# Patient Record
Sex: Male | Born: 1955 | Race: Black or African American | Hispanic: No | Marital: Married | Smoking: Never smoker
Health system: Southern US, Community
[De-identification: ages and names within clinical notes are randomized; demographics above are authoritative.]

## PROBLEM LIST (undated history)

## (undated) DIAGNOSIS — F419 Anxiety disorder, unspecified: Secondary | ICD-10-CM

## (undated) DIAGNOSIS — I77810 Thoracic aortic ectasia: Secondary | ICD-10-CM

## (undated) DIAGNOSIS — E7800 Pure hypercholesterolemia, unspecified: Secondary | ICD-10-CM

## (undated) DIAGNOSIS — E269 Hyperaldosteronism, unspecified: Secondary | ICD-10-CM

## (undated) DIAGNOSIS — R931 Abnormal findings on diagnostic imaging of heart and coronary circulation: Secondary | ICD-10-CM

## (undated) DIAGNOSIS — R251 Tremor, unspecified: Secondary | ICD-10-CM

## (undated) DIAGNOSIS — R55 Syncope and collapse: Secondary | ICD-10-CM

## (undated) DIAGNOSIS — I10 Essential (primary) hypertension: Secondary | ICD-10-CM

## (undated) DIAGNOSIS — I422 Other hypertrophic cardiomyopathy: Secondary | ICD-10-CM

## (undated) HISTORY — DX: Tremor, unspecified: R25.1

## (undated) HISTORY — DX: Abnormal findings on diagnostic imaging of heart and coronary circulation: R93.1

## (undated) HISTORY — PX: KNEE SURGERY: SHX244

## (undated) HISTORY — DX: Thoracic aortic ectasia: I77.810

## (undated) HISTORY — DX: Pure hypercholesterolemia, unspecified: E78.00

## (undated) HISTORY — PX: HERNIA REPAIR: SHX51

## (undated) HISTORY — DX: Hyperaldosteronism, unspecified: E26.9

## (undated) HISTORY — DX: Other hypertrophic cardiomyopathy: I42.2

## (undated) HISTORY — DX: Anxiety disorder, unspecified: F41.9

## (undated) HISTORY — PX: SKIN GRAFT: SHX250

## (undated) HISTORY — DX: Syncope and collapse: R55

---

## 2011-01-07 ENCOUNTER — Emergency Department (HOSPITAL_COMMUNITY)
Admission: EM | Admit: 2011-01-07 | Discharge: 2011-01-07 | Disposition: A | Discharge status: Home / Self Care | Payer: PRIVATE HEALTH INSURANCE | Attending: Emergency Medicine | Admitting: Emergency Medicine

## 2011-01-07 DIAGNOSIS — Z862 Personal history of diseases of the blood and blood-forming organs and certain disorders involving the immune mechanism: Secondary | ICD-10-CM | POA: Insufficient documentation

## 2011-01-07 DIAGNOSIS — Z79899 Other long term (current) drug therapy: Secondary | ICD-10-CM | POA: Insufficient documentation

## 2011-01-07 DIAGNOSIS — R110 Nausea: Secondary | ICD-10-CM | POA: Insufficient documentation

## 2011-01-07 DIAGNOSIS — R42 Dizziness and giddiness: Secondary | ICD-10-CM | POA: Insufficient documentation

## 2011-01-07 DIAGNOSIS — Z8639 Personal history of other endocrine, nutritional and metabolic disease: Secondary | ICD-10-CM | POA: Insufficient documentation

## 2011-01-07 DIAGNOSIS — I10 Essential (primary) hypertension: Secondary | ICD-10-CM | POA: Insufficient documentation

## 2011-04-11 ENCOUNTER — Inpatient Hospital Stay (INDEPENDENT_AMBULATORY_CARE_PROVIDER_SITE_OTHER)
Admission: RE | Admit: 2011-04-11 | Discharge: 2011-04-11 | Disposition: A | Discharge status: Home / Self Care | Payer: PRIVATE HEALTH INSURANCE | Source: Ambulatory Visit | Attending: Emergency Medicine | Admitting: Emergency Medicine

## 2011-04-11 ENCOUNTER — Emergency Department (HOSPITAL_COMMUNITY)
Admission: EM | Admit: 2011-04-11 | Discharge: 2011-04-12 | Disposition: A | Discharge status: Home / Self Care | Payer: PRIVATE HEALTH INSURANCE | Attending: Emergency Medicine | Admitting: Emergency Medicine

## 2011-04-11 DIAGNOSIS — R42 Dizziness and giddiness: Secondary | ICD-10-CM | POA: Insufficient documentation

## 2011-04-11 DIAGNOSIS — I10 Essential (primary) hypertension: Secondary | ICD-10-CM | POA: Insufficient documentation

## 2011-04-11 DIAGNOSIS — R5383 Other fatigue: Secondary | ICD-10-CM | POA: Insufficient documentation

## 2011-04-11 DIAGNOSIS — R110 Nausea: Secondary | ICD-10-CM | POA: Insufficient documentation

## 2011-04-11 DIAGNOSIS — H538 Other visual disturbances: Secondary | ICD-10-CM | POA: Insufficient documentation

## 2011-04-11 DIAGNOSIS — R5381 Other malaise: Secondary | ICD-10-CM | POA: Insufficient documentation

## 2011-04-11 DIAGNOSIS — E876 Hypokalemia: Secondary | ICD-10-CM

## 2011-04-11 LAB — POCT I-STAT, CHEM 8
BUN: 13 mg/dL (ref 6–23)
Calcium, Ion: 1.19 mmol/L (ref 1.12–1.32)
Chloride: 105 mEq/L (ref 96–112)
Glucose, Bld: 98 mg/dL (ref 70–99)

## 2011-04-12 ENCOUNTER — Emergency Department (HOSPITAL_COMMUNITY): Payer: PRIVATE HEALTH INSURANCE

## 2011-04-12 LAB — BASIC METABOLIC PANEL
BUN: 12 mg/dL (ref 6–23)
CO2: 32 mEq/L (ref 19–32)
Chloride: 105 mEq/L (ref 96–112)
Creatinine, Ser: 0.96 mg/dL (ref 0.50–1.35)

## 2011-04-12 LAB — DIFFERENTIAL
Eosinophils Absolute: 0.1 10*3/uL (ref 0.0–0.7)
Eosinophils Relative: 1 % (ref 0–5)
Lymphs Abs: 3 10*3/uL (ref 0.7–4.0)
Monocytes Relative: 9 % (ref 3–12)

## 2011-04-12 LAB — URINALYSIS, ROUTINE W REFLEX MICROSCOPIC
Glucose, UA: NEGATIVE mg/dL
Ketones, ur: NEGATIVE mg/dL
Leukocytes, UA: NEGATIVE
pH: 7 (ref 5.0–8.0)

## 2011-04-12 LAB — CBC
MCH: 30.2 pg (ref 26.0–34.0)
MCV: 83.9 fL (ref 78.0–100.0)
Platelets: 139 10*3/uL — ABNORMAL LOW (ref 150–400)
RDW: 13 % (ref 11.5–15.5)

## 2011-04-12 LAB — CK TOTAL AND CKMB (NOT AT ARMC)
CK, MB: 4.2 ng/mL — ABNORMAL HIGH (ref 0.3–4.0)
Relative Index: 2.4 (ref 0.0–2.5)

## 2011-04-12 LAB — TROPONIN I: Troponin I: <0.3 ng/mL (ref <0.30)

## 2011-08-04 ENCOUNTER — Other Ambulatory Visit: Payer: Self-pay | Admitting: Orthopaedic Surgery

## 2011-08-04 DIAGNOSIS — M542 Cervicalgia: Secondary | ICD-10-CM

## 2011-08-07 ENCOUNTER — Inpatient Hospital Stay
Admission: RE | Admit: 2011-08-07 | Discharge: 2011-08-07 | Payer: Self-pay | Source: Ambulatory Visit | Attending: Orthopaedic Surgery | Admitting: Orthopaedic Surgery

## 2011-08-08 ENCOUNTER — Ambulatory Visit
Admission: RE | Admit: 2011-08-08 | Discharge: 2011-08-08 | Disposition: A | Discharge status: Home / Self Care | Payer: PRIVATE HEALTH INSURANCE | Source: Ambulatory Visit | Attending: Orthopaedic Surgery | Admitting: Orthopaedic Surgery

## 2011-08-08 DIAGNOSIS — M542 Cervicalgia: Secondary | ICD-10-CM

## 2011-09-11 ENCOUNTER — Observation Stay (HOSPITAL_COMMUNITY)
Admission: EM | Admit: 2011-09-11 | Discharge: 2011-09-14 | DRG: 081 | Disposition: A | Discharge status: Home / Self Care | Payer: PRIVATE HEALTH INSURANCE | Attending: Internal Medicine | Admitting: Internal Medicine

## 2011-09-11 ENCOUNTER — Emergency Department (HOSPITAL_COMMUNITY): Payer: PRIVATE HEALTH INSURANCE

## 2011-09-11 ENCOUNTER — Encounter: Payer: Self-pay | Admitting: *Deleted

## 2011-09-11 DIAGNOSIS — R209 Unspecified disturbances of skin sensation: Secondary | ICD-10-CM | POA: Insufficient documentation

## 2011-09-11 DIAGNOSIS — I10 Essential (primary) hypertension: Secondary | ICD-10-CM

## 2011-09-11 DIAGNOSIS — E876 Hypokalemia: Secondary | ICD-10-CM

## 2011-09-11 DIAGNOSIS — R4182 Altered mental status, unspecified: Principal | ICD-10-CM

## 2011-09-11 DIAGNOSIS — K7689 Other specified diseases of liver: Secondary | ICD-10-CM | POA: Insufficient documentation

## 2011-09-11 DIAGNOSIS — D696 Thrombocytopenia, unspecified: Secondary | ICD-10-CM | POA: Insufficient documentation

## 2011-09-11 HISTORY — DX: Essential (primary) hypertension: I10

## 2011-09-11 LAB — COMPREHENSIVE METABOLIC PANEL
BUN: 12 mg/dL (ref 6–23)
CO2: 27 mEq/L (ref 19–32)
Calcium: 9.3 mg/dL (ref 8.4–10.5)
Chloride: 106 mEq/L (ref 96–112)
Creatinine, Ser: 1.05 mg/dL (ref 0.50–1.35)
GFR calc Af Amer: 89 mL/min — ABNORMAL LOW (ref >90)
GFR calc non Af Amer: 77 mL/min — ABNORMAL LOW (ref >90)
Glucose, Bld: 92 mg/dL (ref 70–99)
Total Bilirubin: 0.9 mg/dL (ref 0.3–1.2)

## 2011-09-11 LAB — DIFFERENTIAL
Basophils Relative: 0 % (ref 0–1)
Blasts: 0 %
Lymphocytes Relative: 23 % (ref 12–46)
Lymphs Abs: 2.5 10*3/uL (ref 0.7–4.0)
Neutro Abs: 7.8 10*3/uL — ABNORMAL HIGH (ref 1.7–7.7)
Neutrophils Relative %: 72 % (ref 43–77)
Promyelocytes Absolute: 0 %
nRBC: 0 /100 WBC

## 2011-09-11 LAB — RAPID URINE DRUG SCREEN, HOSP PERFORMED
Barbiturates: NOT DETECTED
Benzodiazepines: NOT DETECTED
Cocaine: NOT DETECTED

## 2011-09-11 LAB — URINALYSIS, ROUTINE W REFLEX MICROSCOPIC
Nitrite: NEGATIVE
Protein, ur: NEGATIVE mg/dL
Specific Gravity, Urine: 1.017 (ref 1.005–1.030)
Urobilinogen, UA: 1 mg/dL (ref 0.0–1.0)

## 2011-09-11 LAB — CBC
Hemoglobin: 16.8 g/dL (ref 13.0–17.0)
MCHC: 34.6 g/dL (ref 30.0–36.0)
Platelets: 130 10*3/uL — ABNORMAL LOW (ref 150–400)
RDW: 13.5 % (ref 11.5–15.5)

## 2011-09-11 LAB — POCT I-STAT TROPONIN I: Troponin i, poc: 0.05 ng/mL (ref 0.00–0.08)

## 2011-09-11 LAB — CARDIAC PANEL(CRET KIN+CKTOT+MB+TROPI)
CK, MB: 5.5 ng/mL — ABNORMAL HIGH (ref 0.3–4.0)
Relative Index: 3.1 — ABNORMAL HIGH (ref 0.0–2.5)
Troponin I: <0.3 ng/mL (ref <0.30)

## 2011-09-11 LAB — SALICYLATE LEVEL: Salicylate Lvl: <2 mg/dL — ABNORMAL LOW (ref 2.8–20.0)

## 2011-09-11 LAB — ETHANOL: Alcohol, Ethyl (B): <11 mg/dL (ref 0–11)

## 2011-09-11 LAB — ACETAMINOPHEN LEVEL: Acetaminophen (Tylenol), Serum: <15 ug/mL (ref 10–30)

## 2011-09-11 MED ORDER — POTASSIUM CHLORIDE 20 MEQ/15ML (10%) PO LIQD: 40.0000 meq | Freq: Once | ORAL | Status: DC

## 2011-09-11 MED ORDER — POTASSIUM CHLORIDE 20 MEQ/15ML (10%) PO LIQD
ORAL | Status: AC
  Filled 2011-09-11: qty 30
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
## 2011-09-12 ENCOUNTER — Inpatient Hospital Stay (HOSPITAL_COMMUNITY): Payer: PRIVATE HEALTH INSURANCE

## 2011-09-12 ENCOUNTER — Encounter (HOSPITAL_COMMUNITY): Payer: Self-pay | Admitting: Internal Medicine

## 2011-09-12 DIAGNOSIS — I10 Essential (primary) hypertension: Secondary | ICD-10-CM

## 2011-09-12 DIAGNOSIS — R4182 Altered mental status, unspecified: Secondary | ICD-10-CM

## 2011-09-12 DIAGNOSIS — E876 Hypokalemia: Secondary | ICD-10-CM

## 2011-09-12 LAB — COMPREHENSIVE METABOLIC PANEL
Albumin: 3.3 g/dL — ABNORMAL LOW (ref 3.5–5.2)
BUN: 10 mg/dL (ref 6–23)
Calcium: 8.6 mg/dL (ref 8.4–10.5)
Creatinine, Ser: 0.93 mg/dL (ref 0.50–1.35)
GFR calc Af Amer: >90 mL/min (ref >90)
Glucose, Bld: 141 mg/dL — ABNORMAL HIGH (ref 70–99)
Potassium: 3.6 mEq/L (ref 3.5–5.1)
Total Protein: 5.9 g/dL — ABNORMAL LOW (ref 6.0–8.3)

## 2011-09-12 LAB — MAGNESIUM: Magnesium: 1.9 mg/dL (ref 1.5–2.5)

## 2011-09-12 LAB — AMMONIA: Ammonia: 34 umol/L (ref 11–60)

## 2011-09-12 LAB — CBC
Hemoglobin: 15.7 g/dL (ref 13.0–17.0)
Platelets: 131 10*3/uL — ABNORMAL LOW (ref 150–400)
RBC: 5.35 MIL/uL (ref 4.22–5.81)
WBC: 9.2 10*3/uL (ref 4.0–10.5)

## 2011-09-12 LAB — BASIC METABOLIC PANEL
CO2: 27 mEq/L (ref 19–32)
Calcium: 8.8 mg/dL (ref 8.4–10.5)
Chloride: 108 mEq/L (ref 96–112)
Sodium: 144 mEq/L (ref 135–145)

## 2011-09-12 LAB — TSH: TSH: 1.854 u[IU]/mL (ref 0.350–4.500)

## 2011-09-12 LAB — URINE CULTURE

## 2011-09-12 MED ORDER — LISINOPRIL 40 MG PO TABS
40.0000 mg | ORAL_TABLET | Freq: Every day | ORAL | Status: DC
  Administered 2011-09-12 – 2011-09-14 (×3): 40 mg via ORAL
  Filled 2011-09-12 (×3): qty 1

## 2011-09-12 MED ORDER — ATENOLOL 50 MG PO TABS
50.0000 mg | ORAL_TABLET | Freq: Every day | ORAL | Status: DC
  Administered 2011-09-12 – 2011-09-13 (×2): 50 mg via ORAL
  Filled 2011-09-12 (×2): qty 1

## 2011-09-12 MED ORDER — COLCHICINE 0.6 MG PO TABS
0.6000 mg | ORAL_TABLET | Freq: Every day | ORAL | Status: DC
  Administered 2011-09-12 – 2011-09-14 (×3): 0.6 mg via ORAL
  Filled 2011-09-12 (×3): qty 1

## 2011-09-12 MED ORDER — HYDRALAZINE HCL 20 MG/ML IJ SOLN
10.0000 mg | INTRAMUSCULAR | Status: DC | PRN
  Filled 2011-09-12: qty 0.5

## 2011-09-12 MED ORDER — SODIUM CHLORIDE 0.9 % IV SOLN
INTRAVENOUS | Status: DC
  Administered 2011-09-12 – 2011-09-13 (×2): via INTRAVENOUS

## 2011-09-12 MED ORDER — POTASSIUM CHLORIDE 10 MEQ/100ML IV SOLN
10.0000 meq | INTRAVENOUS | Status: AC
  Administered 2011-09-12 (×5): 10 meq via INTRAVENOUS
  Filled 2011-09-12 (×5): qty 100

## 2011-09-12 MED ORDER — ASPIRIN EC 81 MG PO TBEC
81.0000 mg | DELAYED_RELEASE_TABLET | Freq: Every day | ORAL | Status: DC
  Administered 2011-09-12 – 2011-09-14 (×3): 81 mg via ORAL
  Filled 2011-09-12 (×3): qty 1

## 2011-09-12 MED ORDER — AMLODIPINE BESYLATE 10 MG PO TABS
10.0000 mg | ORAL_TABLET | Freq: Every day | ORAL | Status: DC
  Filled 2011-09-12: qty 1

## 2011-09-12 MED ORDER — ACETAMINOPHEN 325 MG PO TABS
650.0000 mg | ORAL_TABLET | Freq: Four times a day (QID) | ORAL | Status: DC | PRN
  Administered 2011-09-14 (×2): 650 mg via ORAL
  Filled 2011-09-12 (×2): qty 2

## 2011-09-12 MED ORDER — INFLUENZA VIRUS VACC SPLIT PF IM SUSP
0.5000 mL | INTRAMUSCULAR | Status: AC
  Filled 2011-09-12: qty 0.5

## 2011-09-12 MED ORDER — ONDANSETRON HCL 4 MG/2ML IJ SOLN: 4.0000 mg | Freq: Four times a day (QID) | INTRAMUSCULAR | Status: DC | PRN

## 2011-09-12 MED ORDER — POTASSIUM CHLORIDE CRYS ER 20 MEQ PO TBCR
40.0000 meq | EXTENDED_RELEASE_TABLET | Freq: Three times a day (TID) | ORAL | Status: AC
  Administered 2011-09-12 (×3): 40 meq via ORAL
  Filled 2011-09-12 (×3): qty 2

## 2011-09-12 MED ORDER — ONDANSETRON HCL 4 MG PO TABS: 4.0000 mg | ORAL_TABLET | Freq: Four times a day (QID) | ORAL | Status: DC | PRN

## 2011-09-12 MED ORDER — ACETAMINOPHEN 650 MG RE SUPP: 650.0000 mg | Freq: Four times a day (QID) | RECTAL | Status: DC | PRN

## 2011-09-12 MED ORDER — FLUTICASONE PROPIONATE 50 MCG/ACT NA SUSP
2.0000 | Freq: Every day | NASAL | Status: DC
  Administered 2011-09-12: 2 via NASAL
  Filled 2011-09-12: qty 16

## 2011-09-12 NOTE — Progress Notes (Signed)
Pharmacy called spoke to pharm tech at [facility]. Pharmacy to call Dr. Arsenio Loader office for medication updates per Dr. Antionette Poles request. Julien Nordmann [facility]

## 2011-09-12 NOTE — Progress Notes (Signed)
Subjective: hypertensive overnight .  Objective: Vital signs in last 24 hours: Filed Vitals:   09/12/11 0100 09/12/11 0125 09/12/11 0222 09/12/11 0600  BP: 168/92 188/6 168/94 164/87  Pulse: 62 71 65 59  Temp:   98.1 F (36.7 C) 98.2 F (36.8 C)  TempSrc:   Oral Oral  Resp: 18 20 20 18   Height:   5\' 11"  (1.803 m)   Weight:   81.2 kg (179 lb 0.2 oz)   SpO2: 95% 98% 98% 97%    Intake/Output Summary (Last 24 hours) at 09/12/11 1107 Last data filed at 09/12/11 0645  Gross per 24 hour  Intake   1845 ml  Output   1600 ml  Net    245 ml    Weight change:   General: Alert, awake, oriented x3, in no acute distress. HEENT: No bruits, no goiter. Heart: Regular rate and rhythm, without murmurs, rubs, gallops. Lungs: Clear to auscultation bilaterally. Abdomen: Soft, nontender, nondistended, positive bowel sounds. Extremities: No clubbing cyanosis or edema with positive pedal pulses. Neuro: Grossly intact, nonfocal.    Lab Results: Results for orders placed during the hospital encounter of 09/11/11 (from the past 24 hour(s))  CBC     Status: Abnormal   Collection Time   09/11/11  9:36 PM      Component Value Range   WBC 10.8 (*) 4.0 - 10.5 (K/uL)   RBC 5.70  4.22 - 5.81 (MIL/uL)   Hemoglobin 16.8  13.0 - 17.0 (g/dL)   HCT 16.1  09.6 - 04.5 (%)   MCV 85.3  78.0 - 100.0 (fL)   MCH 29.5  26.0 - 34.0 (pg)   MCHC 34.6  30.0 - 36.0 (g/dL)   RDW 40.9  81.1 - 91.4 (%)   Platelets 130 (*) 150 - 400 (K/uL)  DIFFERENTIAL     Status: Abnormal   Collection Time   09/11/11  9:36 PM      Component Value Range   Neutrophils Relative 72  43 - 77 (%)   Lymphocytes Relative 23  12 - 46 (%)   Monocytes Relative 4  3 - 12 (%)   Eosinophils Relative 1  0 - 5 (%)   Basophils Relative 0  0 - 1 (%)   Band Neutrophils 0  0 - 10 (%)   Metamyelocytes Relative 0     Myelocytes 0     Promyelocytes Absolute 0     Blasts 0     nRBC 0  0 (/100 WBC)   Neutro Abs 7.8 (*) 1.7 - 7.7 (K/uL)   Lymphs Abs 2.5  0.7 - 4.0 (K/uL)   Monocytes Absolute 0.4  0.1 - 1.0 (K/uL)   Eosinophils Absolute 0.1  0.0 - 0.7 (K/uL)   Basophils Absolute 0.0  0.0 - 0.1 (K/uL)  COMPREHENSIVE METABOLIC PANEL     Status: Abnormal   Collection Time   09/11/11  9:36 PM      Component Value Range   Sodium 142  135 - 145 (mEq/L)   Potassium 2.8 (*) 3.5 - 5.1 (mEq/L)   Chloride 106  96 - 112 (mEq/L)   CO2 27  19 - 32 (mEq/L)   Glucose, Bld 92  70 - 99 (mg/dL)   BUN 12  6 - 23 (mg/dL)   Creatinine, Ser 7.82  0.50 - 1.35 (mg/dL)   Calcium 9.3  8.4 - 95.6 (mg/dL)   Total Protein 6.4  6.0 - 8.3 (g/dL)   Albumin 3.8  3.5 - 5.2 (  g/dL)   AST 25  0 - 37 (U/L)   ALT 22  0 - 53 (U/L)   Alkaline Phosphatase 69  39 - 117 (U/L)   Total Bilirubin 0.9  0.3 - 1.2 (mg/dL)   GFR calc non Af Amer 77 (*) >90 (mL/min)   GFR calc Af Amer 89 (*) >90 (mL/min)  URINALYSIS, ROUTINE W REFLEX MICROSCOPIC     Status: Normal   Collection Time   09/11/11  9:36 PM      Component Value Range   Color, Urine YELLOW  YELLOW    Appearance CLEAR  CLEAR    Specific Gravity, Urine 1.017  1.005 - 1.030    pH 6.5  5.0 - 8.0    Glucose, UA NEGATIVE  NEGATIVE (mg/dL)   Hgb urine dipstick NEGATIVE  NEGATIVE    Bilirubin Urine NEGATIVE  NEGATIVE    Ketones, ur NEGATIVE  NEGATIVE (mg/dL)   Protein, ur NEGATIVE  NEGATIVE (mg/dL)   Urobilinogen, UA 1.0  0.0 - 1.0 (mg/dL)   Nitrite NEGATIVE  NEGATIVE    Leukocytes, UA NEGATIVE  NEGATIVE   CARDIAC PANEL(CRET KIN+CKTOT+MB+TROPI)     Status: Abnormal   Collection Time   09/11/11  9:36 PM      Component Value Range   Total CK 179  7 - 232 (U/L)   CK, MB 5.5 (*) 0.3 - 4.0 (ng/mL)   Troponin I <0.30  <0.30 (ng/mL)   Relative Index 3.1 (*) 0.0 - 2.5   URINE RAPID DRUG SCREEN (HOSP PERFORMED)     Status: Normal   Collection Time   09/11/11  9:36 PM      Component Value Range   Opiates NONE DETECTED  NONE DETECTED    Cocaine NONE DETECTED  NONE DETECTED    Benzodiazepines NONE DETECTED   NONE DETECTED    Amphetamines NONE DETECTED  NONE DETECTED    Tetrahydrocannabinol NONE DETECTED  NONE DETECTED    Barbiturates NONE DETECTED  NONE DETECTED   ETHANOL     Status: Normal   Collection Time   09/11/11  9:36 PM      Component Value Range   Alcohol, Ethyl (B) <11  0 - 11 (mg/dL)  SALICYLATE LEVEL     Status: Abnormal   Collection Time   09/11/11  9:36 PM      Component Value Range   Salicylate Lvl <2.0 (*) 2.8 - 20.0 (mg/dL)  ACETAMINOPHEN LEVEL     Status: Normal   Collection Time   09/11/11  9:36 PM      Component Value Range   Acetaminophen (Tylenol), Serum <15.0  10 - 30 (ug/mL)  POCT I-STAT TROPONIN I     Status: Normal   Collection Time   09/11/11  9:49 PM      Component Value Range   Troponin i, poc 0.05  0.00 - 0.08 (ng/mL)   Comment 3              Micro: No results found for this or any previous visit (from the past 240 hour(s)).  Studies/Results: Ct Head Wo Contrast  09/11/2011  *   IMPRESSION: Negative exam.  Original Report Authenticated By: Patterson Hammersmith, M.D.   Mr Brain Wo Contrast  09/12/2011  *  IMPRESSION: No acute intracranial abnormality.  Mild chronic sinusitis.  Low lying cerebellar tonsil on the right, probably an incidental finding.  Original Report Authenticated By: Camelia Phenes, M.D.    Medications:     .  amLODipine  10 mg Oral Daily  . atenolol  50 mg Oral Daily  . influenza  inactive virus vaccine  0.5 mL Intramuscular Tomorrow-1000  . potassium chloride  10 mEq Intravenous Q1 Hr x 5  . potassium chloride      . potassium chloride  40 mEq Oral TID  . DISCONTD: potassium chloride  40 mEq Oral Once    Assessment:  1. Brief episode of altered mental status.  #2. Hypokalemia. Rule out hyperaldosteronism , workup is pending #3. Uncontrolled hypertension.   Plan  continue telemetry due to hypokalemia   MRI negative, EEG is pending  We will also check TSH RPR and B12 levels.  For his hypokalemia we will replace  potassium and recheck metabolic panel later.    renin aldosterone ratio and levels. pending Further recommendations based on the tests ordered   LOS: 1 day   [facility] 09/12/2011, 11:07 AM

## 2011-09-12 NOTE — Progress Notes (Signed)
Physical Therapy Evaluation Patient Details Name: Duane Warren MRN: [id number] DOB: 1956-01-27 Today's Date: 09/12/2011  Problem List:  Patient Active Problem List  Diagnoses  . Altered mental status  . Hypokalemia  . Hypertension    Past Medical History:  Past Medical History  Diagnosis Date  . Hypertension   . Heart disease   . Heart murmur    Past Surgical History:  Past Surgical History  Procedure Date  . Hernia repair     PT Assessment/Plan/Recommendation PT Assessment Clinical Impression Statement: Pt. tolerated treatment well.  Independent with all mobility.  No further acute PT needs.  PT Recommendation/Assessment: Patent does not need any further PT services No Skilled PT: Patient at baseline level of functioning;Patient is independent with all acitivity/mobility PT Goals     PT Evaluation Precautions/Restrictions    Prior Functioning  Home Living Lives With: Spouse;Family (Mother and sister) Type of Home: House Home Layout: Multi-level;Able to live on main level with bedroom/bathroom Alternate Level Stairs-Rails: Right Home Access: Stairs to enter Entrance Stairs-Rails: Right Entrance Stairs-Number of Steps: 2 in garage, 4 at front porch Bathroom Shower/Tub: Walk-in shower;Door [employer] Toilet: Standard Bathroom Accessibility: Yes How Accessible: Accessible via walker Home Adaptive Equipment: None Prior Function Level of Independence: Independent with basic ADLs;Independent with gait;Independent with transfers;Independent with homemaking with ambulation Driving: Yes Vocation: Retired Financial risk analyst Arousal/Alertness: Awake/alert Overall Cognitive Status: Appears within functional limits for tasks assessed Orientation Level: Oriented X4 Cognition - Other Comments: Per pt.'s wife, pt.'s memory is coming back although he still cannot recall this past sunday being their anniversary.  Sensation/Coordination   Extremity Assessment RLE  Assessment RLE Assessment: Within Functional Limits LLE Assessment LLE Assessment: Within Functional Limits Mobility (including Balance) Bed Mobility Bed Mobility: Yes Supine to Sit: 7: Independent Sit to Supine - Right: 7: Independent Transfers Transfers: Yes Sit to Stand: 7: Independent Stand to Sit: 7: Independent Ambulation/Gait Ambulation/Gait: Yes Ambulation/Gait Assistance: 7: Independent Ambulation Distance (Feet): 350 Feet Assistive device: None Gait Pattern: Within Functional Limits Stairs: Yes Stairs Assistance: 7: Independent Stair Management Technique: One rail Right Number of Stairs: 4  Height of Stairs: 8   Posture/Postural Control Posture/Postural Control: No significant limitations Exercise    End of Session PT - End of Session Activity Tolerance: Patient tolerated treatment well Patient left: in bed;with call bell in reach;with family/visitor present Nurse Communication: Mobility status for ambulation General Behavior During Session: [facility] for tasks performed Cognition: Impaired Cognitive Impairment: Per pt.'s wife still has a few STM deficits but states that "he is coming back."  Evaline Waltman, SPT  09/12/2011, 11:48 AM

## 2011-09-12 NOTE — Progress Notes (Signed)
Called 5th floor pharmacy to verify medication list updates, update needs to be completed spoke to pharmacist Duane Warren. Duane Warren [facility]

## 2011-09-12 NOTE — H&P (Signed)
Duane Warren is an 55 y.o. male.   Chief Complaint: Confusion. HPI: 55 year old male with history of hypertension had gone to his lawyer with his wife yesterday evening. And at around 5 PM and they're trying to return back home, patient was not able to locate the car in the parking lot at the lawyers office. His wife helped him to locate the car. Then they drove to their house. But patient was not able to find the way to the house. He went past the street of his house. His wife had to redirect him to the house. When they reached the house he was not able to locate the garage opener. When they reached inside the house patient looked confused was not able to recognize things and said he was not feeling well. At this point his wife brought him to the ER. In the ER patient had CAT scan head which was negative for anything acute. His labs showed he has hypokalemia. Patient was alert awake oriented to time place and person but still does not recall the incident which happened after the lawyers office visit. Patient denies headache any focal deficit any dizziness any visual symptoms chest pain nausea vomiting or shortness of breath.  Past Medical History  Diagnosis Date  . Hypertension   . Heart disease     Past Surgical History  Procedure Date  . Hernia repair     History reviewed. No pertinent family history. Social History:  reports that he has never smoked. He does not have any smokeless tobacco history on file. He reports that he does not drink alcohol. His drug history not on file.  Allergies: No Known Allergies  Medications Prior to Admission  Medication Dose Route Frequency Provider Last Rate Last Dose  . 0.9 %  sodium chloride infusion   Intravenous Continuous Eduard Clos      . acetaminophen (TYLENOL) tablet 650 mg  650 mg Oral Q6H PRN Eduard Clos       Or  . acetaminophen (TYLENOL) suppository 650 mg  650 mg Rectal Q6H PRN Eduard Clos      . amLODipine  (NORVASC) tablet 10 mg  10 mg Oral Daily Eduard Clos      . atenolol (TENORMIN) tablet 50 mg  50 mg Oral Daily Eduard Clos      . hydrALAZINE (APRESOLINE) injection 10 mg  10 mg Intravenous Q4H PRN Eduard Clos      . ondansetron (ZOFRAN) tablet 4 mg  4 mg Oral Q6H PRN Eduard Clos       Or  . ondansetron (ZOFRAN) injection 4 mg  4 mg Intravenous Q6H PRN Eduard Clos      . potassium chloride 10 mEq in 100 mL IVPB  10 mEq Intravenous Q1 Hr x 5 Eduard Clos      . potassium chloride 20 MEQ/15ML (10%) liquid           . DISCONTD: potassium chloride 20 MEQ/15ML (10%) liquid 40 mEq  40 mEq Oral Once Carleene Cooper III, MD       No current outpatient prescriptions on file as of 09/12/2011.    Results for orders placed during the hospital encounter of 09/11/11 (from the past 48 hour(s))  CBC     Status: Abnormal   Collection Time   09/11/11  9:36 PM      Component Value Range Comment   WBC 10.8 (*) 4.0 - 10.5 (K/uL)    RBC  5.70  4.22 - 5.81 (MIL/uL)    Hemoglobin 16.8  13.0 - 17.0 (g/dL)    HCT 82.9  56.2 - 13.0 (%)    MCV 85.3  78.0 - 100.0 (fL)    MCH 29.5  26.0 - 34.0 (pg)    MCHC 34.6  30.0 - 36.0 (g/dL)    RDW 86.5  78.4 - 69.6 (%)    Platelets 130 (*) 150 - 400 (K/uL)   DIFFERENTIAL     Status: Abnormal   Collection Time   09/11/11  9:36 PM      Component Value Range Comment   Neutrophils Relative 72  43 - 77 (%)    Lymphocytes Relative 23  12 - 46 (%)    Monocytes Relative 4  3 - 12 (%)    Eosinophils Relative 1  0 - 5 (%)    Basophils Relative 0  0 - 1 (%)    Band Neutrophils 0  0 - 10 (%)    Metamyelocytes Relative 0      Myelocytes 0      Promyelocytes Absolute 0      Blasts 0      nRBC 0  0 (/100 WBC)    Neutro Abs 7.8 (*) 1.7 - 7.7 (K/uL)    Lymphs Abs 2.5  0.7 - 4.0 (K/uL)    Monocytes Absolute 0.4  0.1 - 1.0 (K/uL)    Eosinophils Absolute 0.1  0.0 - 0.7 (K/uL)    Basophils Absolute 0.0  0.0 - 0.1 (K/uL)     COMPREHENSIVE METABOLIC PANEL     Status: Abnormal   Collection Time   09/11/11  9:36 PM      Component Value Range Comment   Sodium 142  135 - 145 (mEq/L)    Potassium 2.8 (*) 3.5 - 5.1 (mEq/L)    Chloride 106  96 - 112 (mEq/L)    CO2 27  19 - 32 (mEq/L)    Glucose, Bld 92  70 - 99 (mg/dL)    BUN 12  6 - 23 (mg/dL)    Creatinine, Ser 2.95  0.50 - 1.35 (mg/dL)    Calcium 9.3  8.4 - 10.5 (mg/dL)    Total Protein 6.4  6.0 - 8.3 (g/dL)    Albumin 3.8  3.5 - 5.2 (g/dL)    AST 25  0 - 37 (U/L)    ALT 22  0 - 53 (U/L)    Alkaline Phosphatase 69  39 - 117 (U/L)    Total Bilirubin 0.9  0.3 - 1.2 (mg/dL)    GFR calc non Af Amer 77 (*) >90 (mL/min)    GFR calc Af Amer 89 (*) >90 (mL/min)   URINALYSIS, ROUTINE W REFLEX MICROSCOPIC     Status: Normal   Collection Time   09/11/11  9:36 PM      Component Value Range Comment   Color, Urine YELLOW  YELLOW     Appearance CLEAR  CLEAR     Specific Gravity, Urine 1.017  1.005 - 1.030     pH 6.5  5.0 - 8.0     Glucose, UA NEGATIVE  NEGATIVE (mg/dL)    Hgb urine dipstick NEGATIVE  NEGATIVE     Bilirubin Urine NEGATIVE  NEGATIVE     Ketones, ur NEGATIVE  NEGATIVE (mg/dL)    Protein, ur NEGATIVE  NEGATIVE (mg/dL)    Urobilinogen, UA 1.0  0.0 - 1.0 (mg/dL)    Nitrite NEGATIVE  NEGATIVE  Leukocytes, UA NEGATIVE  NEGATIVE  MICROSCOPIC NOT DONE ON URINES WITH NEGATIVE PROTEIN, BLOOD, LEUKOCYTES, NITRITE, OR GLUCOSE <1000 mg/dL.  CARDIAC PANEL(CRET KIN+CKTOT+MB+TROPI)     Status: Abnormal   Collection Time   09/11/11  9:36 PM      Component Value Range Comment   Total CK 179  7 - 232 (U/L)    CK, MB 5.5 (*) 0.3 - 4.0 (ng/mL)    Troponin I <0.30  <0.30 (ng/mL)    Relative Index 3.1 (*) 0.0 - 2.5    URINE RAPID DRUG SCREEN (HOSP PERFORMED)     Status: Normal   Collection Time   09/11/11  9:36 PM      Component Value Range Comment   Opiates NONE DETECTED  NONE DETECTED     Cocaine NONE DETECTED  NONE DETECTED     Benzodiazepines NONE DETECTED   NONE DETECTED     Amphetamines NONE DETECTED  NONE DETECTED     Tetrahydrocannabinol NONE DETECTED  NONE DETECTED     Barbiturates NONE DETECTED  NONE DETECTED    ETHANOL     Status: Normal   Collection Time   09/11/11  9:36 PM      Component Value Range Comment   Alcohol, Ethyl (B) <11  0 - 11 (mg/dL)   SALICYLATE LEVEL     Status: Abnormal   Collection Time   09/11/11  9:36 PM      Component Value Range Comment   Salicylate Lvl <2.0 (*) 2.8 - 20.0 (mg/dL)   ACETAMINOPHEN LEVEL     Status: Normal   Collection Time   09/11/11  9:36 PM      Component Value Range Comment   Acetaminophen (Tylenol), Serum <15.0  10 - 30 (ug/mL)   POCT I-STAT TROPONIN I     Status: Normal   Collection Time   09/11/11  9:49 PM      Component Value Range Comment   Troponin i, poc 0.05  0.00 - 0.08 (ng/mL)    Comment 3             Ct Head Wo Contrast  09/11/2011  *RADIOLOGY REPORT*  Clinical Data:   Memory loss.  Fingertip numbness.  CT HEAD WITHOUT CONTRAST  Technique:  Contiguous axial images were obtained from the base of the skull through the vertex without contrast.  Comparison: None  Findings: There is no intra or extra-axial fluid collection or mass lesion.  The basilar cisterns and ventricles have a normal appearance.  There is no CT evidence for acute infarction or hemorrhage.  Bone windows show no acute finding.  IMPRESSION: Negative exam.  Original Report Authenticated By: Patterson Hammersmith, M.D.    Review of Systems  Constitutional: Negative.   HENT: Negative.   Eyes: Negative.   Respiratory: Negative.   Cardiovascular: Negative.   Gastrointestinal: Negative.   Genitourinary: Negative.   Musculoskeletal: Negative.   Skin: Negative.   Neurological:       Confusion  Psychiatric/Behavioral: Negative.     Blood pressure 188/6, pulse 71, temperature 98.4 F (36.9 C), temperature source Oral, resp. rate 20, SpO2 98.00%. Physical Exam  Constitutional: He is oriented to person, place,  and time. He appears well-developed and well-nourished.  HENT:  Head: Normocephalic and atraumatic.  Right Ear: External ear normal.  Left Ear: External ear normal.  Nose: Nose normal.  Mouth/Throat: Oropharynx is clear and moist.  Eyes: Conjunctivae and EOM are normal. Pupils are equal, round, and reactive to light.  Neck: Normal range of motion. Neck supple.  Cardiovascular: Normal rate, regular rhythm, normal heart sounds and intact distal pulses.   Respiratory: Effort normal and breath sounds normal.  GI: Soft. Bowel sounds are normal.  Musculoskeletal: Normal range of motion.  Neurological: He is alert and oriented to person, place, and time. He has normal reflexes.  Skin: Skin is warm and dry.  Psychiatric: His behavior is normal.     Assessment/Plan #1. Brief episode of altered mental status. #2. Hypokalemia. #3. Uncontrolled hypertension.  Plan Will admit patient to telemetry for observation. At this time patient is alert awake and oriented to place person and time He has no focal deficits. I'm going to get an MRI of the brain and an EEG. We will also check TSH RPR and B12 levels. For his hypokalemia we will replace potassium and recheck metabolic panel later. Since patient has hypertension with hypokalemia we'll check renin aldosterone ratio and levels. Further recommendations based on the tests ordered.  Kamilia Carollo N. 09/12/2011, 2:02 AM

## 2011-09-12 NOTE — Progress Notes (Signed)
Seen and agree with SPT note Theadora Noyes Tabor, PT [phone]  

## 2011-09-13 ENCOUNTER — Inpatient Hospital Stay (HOSPITAL_COMMUNITY): Payer: PRIVATE HEALTH INSURANCE

## 2011-09-13 LAB — BASIC METABOLIC PANEL
CO2: 29 mEq/L (ref 19–32)
GFR calc non Af Amer: 74 mL/min — ABNORMAL LOW (ref >90)
Glucose, Bld: 92 mg/dL (ref 70–99)
Potassium: 3.7 mEq/L (ref 3.5–5.1)
Sodium: 144 mEq/L (ref 135–145)

## 2011-09-13 MED ORDER — CARVEDILOL 6.25 MG PO TABS
6.2500 mg | ORAL_TABLET | Freq: Two times a day (BID) | ORAL | Status: DC
  Administered 2011-09-13 – 2011-09-14 (×2): 6.25 mg via ORAL
  Filled 2011-09-13 (×5): qty 1

## 2011-09-13 MED ORDER — GADOBENATE DIMEGLUMINE 529 MG/ML IV SOLN
17.0000 mL | Freq: Once | INTRAVENOUS | Status: AC | PRN
  Administered 2011-09-13: 17 mL via INTRAVENOUS

## 2011-09-13 MED ORDER — ENALAPRILAT 1.25 MG/ML IV SOLN
1.2500 mg | Freq: Four times a day (QID) | INTRAVENOUS | Status: DC | PRN
  Filled 2011-09-13: qty 1

## 2011-09-13 MED ORDER — CLONIDINE HCL 0.1 MG PO TABS
0.1000 mg | ORAL_TABLET | Freq: Three times a day (TID) | ORAL | Status: DC
  Administered 2011-09-13 – 2011-09-14 (×2): 0.1 mg via ORAL
  Filled 2011-09-13 (×5): qty 1

## 2011-09-13 MED ORDER — AMLODIPINE BESYLATE 5 MG PO TABS
5.0000 mg | ORAL_TABLET | Freq: Every day | ORAL | Status: DC
  Filled 2011-09-13: qty 1

## 2011-09-13 MED ORDER — AMLODIPINE BESYLATE 10 MG PO TABS
10.0000 mg | ORAL_TABLET | Freq: Every day | ORAL | Status: DC
  Filled 2011-09-13 (×3): qty 1

## 2011-09-13 MED ORDER — CARVEDILOL 12.5 MG PO TABS
12.5000 mg | ORAL_TABLET | Freq: Two times a day (BID) | ORAL | Status: DC
  Filled 2011-09-13: qty 1

## 2011-09-13 NOTE — Progress Notes (Signed)
Subjective: Patient denies any complaints. He has no recollection of the events of admission including his altered mental status.  Discussed with patient spouse was at the bedside. She indicates that his altered mental status started subacutely and lasted anywhere between 12-24 hours. Currently she says that his mental status is back to baseline and normal. He has not had similar presentations in the past. He however has had episodes of hypokalemia in the past. Does no history of nausea, vomiting or diarrhea. Does no history of being on diuretics. No history of substance or alcohol abuse.  Objective: Blood pressure 155/91, pulse 49, temperature 98 F (36.7 C), temperature source Oral, resp. rate 18, height 5\' 11"  (1.803 m), weight 81.2 kg (179 lb 0.2 oz), SpO2 96.00%.  Intake/Output Summary (Last 24 hours) at 09/13/11 1349 Last data filed at 09/13/11 0900  Gross per 24 hour  Intake   1386 ml  Output      0 ml  Net   1386 ml   General exam: Patient is standing up next to his bed and is in no obvious distress. Respiratory system: Clear. No increased work of breathing. Cardiovascular system: First and second heart sounds heard, regular bradycardic. No murmur or JVD. Telemetry shows sinus bradycardia in the 50s. Gastrointestinal system: Abdomen is nondistended, nontender, soft and normal bowel sounds heard. No venous hums or bruits heard. Central nervous system: Alert and oriented. No focal neurological deficits. Extremities: Grade 5 x 5 power symmetrically.   Lab Results: Basic Metabolic Panel:  Basename 09/12/11 2218 09/12/11 1156  NA 144 142  K 3.4* 3.6  CL 108 107  CO2 27 27  GLUCOSE 109* 141*  BUN 13 10  CREATININE 1.07 0.93  CALCIUM 8.8 8.6  MG -- 1.9  PHOS -- --   Liver Function Tests:  [facility] 09/12/11 1156 09/11/11 2136  AST 20 25  ALT 20 22  ALKPHOS 65 69  BILITOT 0.8 0.9  PROT 5.9* 6.4  ALBUMIN 3.3* 3.8   No results found for this basename: LIPASE:2,AMYLASE:2  in the last 72 hours  Basename 09/12/11 1540  AMMONIA 34   CBC:  Basename 09/12/11 1156 09/11/11 2136  WBC 9.2 10.8*  NEUTROABS -- 7.8*  HGB 15.7 16.8  HCT 45.6 48.6  MCV 85.2 85.3  PLT 131* 130*   Cardiac Enzymes:  Basename 09/11/11 2136  CKTOTAL 179  CKMB 5.5*  CKMBINDEX --  TROPONINI <0.30   BNP: No results found for this basename: POCBNP:3 in the last 72 hours D-Dimer: No results found for this basename: DDIMER:2 in the last 72 hours CBG: No results found for this basename: GLUCAP:6 in the last 72 hours Hemoglobin A1C: No results found for this basename: HGBA1C in the last 72 hours Fasting Lipid Panel: No results found for this basename: CHOL,HDL,LDLCALC,TRIG,CHOLHDL,LDLDIRECT in the last 72 hours Thyroid Function Tests:  [facility] 09/12/11 1156  TSH 1.854  T4TOTAL --  FREET4 --  T3FREE --  THYROIDAB --   Anemia Panel:  Basename 09/12/11 1156  VITAMINB12 487  FOLATE --  FERRITIN --  TIBC --  IRON --  RETICCTPCT --   Coagulation: No results found for this basename: LABPROT:2,INR:2 in the last 72 hours Urine Drug Screen: Drugs of Abuse     Component Value Date/Time   LABOPIA NONE DETECTED 09/11/2011 2136   COCAINSCRNUR NONE DETECTED 09/11/2011 2136   LABBENZ NONE DETECTED 09/11/2011 2136   AMPHETMU NONE DETECTED 09/11/2011 2136   THCU NONE DETECTED 09/11/2011 2136   LABBARB NONE  DETECTED 09/11/2011 2136    Alcohol Level:  [facility] 09/11/11 2136  ETH <11   Urinalysis:  Misc. Labs:   Micro Results: Recent Results (from the past 240 hour(s))  URINE CULTURE     Status: Normal   Collection Time   09/11/11  9:36 PM      Component Value Range Status Comment   Specimen Description URINE, CLEAN CATCH   Final    Special Requests NONE   Final    Setup Time [id number]   Final    Colony Count NO GROWTH   Final    Culture NO GROWTH   Final    Report Status 09/12/2011 FINAL   Final     Studies/Results: Ct Head Wo Contrast  09/11/2011   *RADIOLOGY REPORT*  Clinical Data:   Memory loss.  Fingertip numbness.  CT HEAD WITHOUT CONTRAST  Technique:  Contiguous axial images were obtained from the base of the skull through the vertex without contrast.  Comparison: None  Findings: There is no intra or extra-axial fluid collection or mass lesion.  The basilar cisterns and ventricles have a normal appearance.  There is no CT evidence for acute infarction or hemorrhage.  Bone windows show no acute finding.  IMPRESSION: Negative exam.  Original Report Authenticated By: Patterson Hammersmith, M.D.   Mr Brain Wo Contrast  09/12/2011  *RADIOLOGY REPORT*  Clinical Data: Mental status  MRI HEAD WITHOUT CONTRAST  Technique:  Multiplanar, multiecho pulse sequences of the brain and surrounding structures were obtained according to standard protocol without intravenous contrast.  Comparison: CT 09/11/2011  Findings: Low lying cerebellar tonsils particularly  on the right side.  The right tonsil is 8 mm below the foramen magnum.  Negative for hydrocephalus.  Negative for acute infarct.  Negative for demyelinating disease. Brainstem is normal.  Small hyperintensities in the basal ganglia and thalami may represent perivascular spaces versus small chronic infarct.  Tiny chronic infarct left cerebellum.  Negative for hemorrhage or fluid collection.  Negative for mass or edema.  Chronic sinusitis with mucosal edema in the maxillary sinus bilaterally.  IMPRESSION: No acute intracranial abnormality.  Mild chronic sinusitis.  Low lying cerebellar tonsil on the right, probably an incidental finding.  Original Report Authenticated By: Camelia Phenes, M.D.    Medications: Scheduled Meds:   . aspirin EC  81 mg Oral Daily  . atenolol  50 mg Oral Daily  . colchicine  0.6 mg Oral Daily  . fluticasone  2 spray Each Nare Daily  . influenza  inactive virus vaccine  0.5 mL Intramuscular Tomorrow-1000  . lisinopril  40 mg Oral Daily  . potassium chloride  40 mEq Oral TID  .  DISCONTD: amLODipine  10 mg Oral Daily   Continuous Infusions:   . sodium chloride 50 mL/hr at 09/13/11 0503   PRN Meds:.acetaminophen, acetaminophen, hydrALAZINE, ondansetron (ZOFRAN) IV, ondansetron  Assessment/Plan: 1. Transient altered mental status: Etiology is unclear. Extensive workup including CT and MRI of the head, blood work hard not revealing for a cause. EEG does not show seizure like activity. Currently resolved. 2. Hypokalemia: This seems to be a recurrent problem. Given his history of hypertension hyper aldosteronism is a possibility. Replete potassium. Check urine spot potassium. Followup renin Aldo Straughn ratio. Potassium is better than on admission. 3. Uncontrolled hypertension. Discussed with patient's primary cardiologist who indicated that patient is on lisinopril 40 mg daily and Coreg 6.25 mg by mouth twice a day. 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 He agreed with workup for possible hyperaldosteronism. Will obtain MRA of the abdomen to rule out renal artery stenosis. If the Renin Aldosterone ratio is abnormal then consider CT of the Adrenal glands. 4. Thrombocytopenia: Possibly chronic. Monitor daily CBCs. 5. History of gout. Patient is on when necessary colchicine.  Discussed patient's care with his spouse was at the bedside, at the patient's request and updated care.   HONGALGI,ANAND 09/13/2011, 1:49 PM

## 2011-09-13 NOTE — Procedures (Signed)
EEG NUMBER:  [id number].  HISTORY:  This is a 55 year old man referred to rule out seizures, per report.  MEDICATIONS:  No anticonvulsive medications per report.  CONDITION OF RECORDING:  This 16-lead EEG was recorded with patient in awake, drowsy, and sleep states.  Background rhythm: background patterns in wakefulness were well- organized with a well-sustained posterior dominant rhythm of 9.5 Hz, symmetrical and reactive to eye opening and closing.  Drowsiness was associated with mild attenuation of voltage and slowing of frequencies.  Normal sleep patterns were seen.    Abnormal potentials: photic stimulation did not activate tracing. Hyperventilation was not activate tracing.  EKG:  Single channel EKG monitoring detected bradycardia.  IMPRESSION:  This is a normal awake, drowsy, and sleep EEG.  A normal EEG does not rule out the clinical diagnosis of epilepsy.  If clinically Warranted a repeat extended EEG or ambulatory recording may be obtained for prolonged recording times and sleep capture, which may increase the diagnostic yield. Clinical correlation was suggested.          ______________________________ Carmell Austria, MD    [id number] D:  09/12/2011 21:14:28  T:  09/12/2011 22:54:00  Job #:  [id number]

## 2011-09-13 NOTE — Progress Notes (Signed)
Occupational Therapy Evaluation Patient Details Name: Duane Warren MRN: [id number] DOB: 1955-12-18 Today's Date: 09/13/2011  Problem List:  Patient Active Problem List  Diagnoses  . Altered mental status  . Hypokalemia  . Hypertension    Past Medical History:  Past Medical History  Diagnosis Date  . Hypertension   . Heart disease   . Heart murmur    Past Surgical History:  Past Surgical History  Procedure Date  . Hernia repair     OT Assessment/Plan/Recommendation OT Assessment OT Recommendation/Assessment: Patient does not need any further OT services    OT Evaluation Precautions/Restrictions    Prior Functioning Home Living Bathroom Shower/Tub: Door;Walk-in shower;Other (comment) (built-in shower seat) Bathroom Toilet: Standard Home Adaptive Equipment: None Prior Function Level of Independence: Independent with basic ADLs;Independent with homemaking with ambulation ADL ADL Eating/Feeding: Simulated;Independent Where Assessed - Eating/Feeding: Edge of bed Grooming: Simulated;Wash/dry hands;Wash/dry face;Brushing hair;Independent Where Assessed - Grooming: Standing at sink Upper Body Bathing: Simulated;Independent Where Assessed - Upper Body Bathing: Standing at sink Lower Body Bathing: Simulated;Independent Where Assessed - Lower Body Bathing: Sit to stand from bed Upper Body Dressing: Simulated;Independent Where Assessed - Upper Body Dressing: Standing Lower Body Dressing: Performed;Independent;Other (comment) (socks only) Where Assessed - Lower Body Dressing: Sit to stand from bed;Sitting, bed Toilet Transfer: Simulated;Independent Toilet Transfer Method: Proofreader: Regular height toilet Toileting - Clothing Manipulation: Simulated;Independent Where Assessed - Toileting Clothing Manipulation: Standing Toileting - Hygiene: Simulated Where Assessed - Toileting Hygiene: Sit on 3-in-1 or toilet Tub/Shower Transfer: Not  assessed;Other (comment) (has walk in shower at home) Tub/Shower Transfer Method: Not assessed Vision/Perception    Cognition Cognition Arousal/Alertness: Awake/alert Overall Cognitive Status: Appears within functional limits for tasks assessed Sensation/Coordination   Extremity Assessment RUE Assessment RUE Assessment:  (MMT 5/5) LUE Assessment LUE Assessment: Within Functional Limits (MMT 5/5) Mobility  Bed Mobility Bed Mobility: Yes Supine to Sit: 7: Independent;HOB elevated (Comment degrees) Sit to Supine - Right: 7: Independent;HOB elevated (comment degrees) Transfers Sit to Stand: 7: Independent;From bed Stand to Sit: 7: Independent;To bed Exercises   End of Session OT - End of Session Equipment Utilized During Treatment: Gait belt Activity Tolerance: Patient tolerated treatment well Patient left: in bed General Behavior During Session: [facility] for tasks performed Cognition: [facility] for tasks performed   Alsie Younes Kirsten Beatrice-Mitchell 09/13/2011, 2:56 PM  09/13/2011 Karisma Meiser K. Beatrice-Mitchell, MS, OTR/L Occupational Therapist Acute Rehabilitation [place]- [facility] Phone: [phone] Pager: [phone] [email or web address]

## 2011-09-13 NOTE — Progress Notes (Signed)
Patient presents with no further acute OT needs.  Rec. D/C home with initial 24 hour supervision, which will be provided by wife and mother.   OT to sign off.  Thanks,  09/13/2011 Jennesis Ramaswamy K. Beatrice-Mitchell, MS, OTR/L Occupational Therapist Acute Rehabilitation [place]- [facility] Phone: [phone] Pager: [phone] [email or web address]

## 2011-09-14 LAB — CBC
Hemoglobin: 16.2 g/dL (ref 13.0–17.0)
MCH: 29.5 pg (ref 26.0–34.0)
RBC: 5.49 MIL/uL (ref 4.22–5.81)
WBC: 9.8 10*3/uL (ref 4.0–10.5)

## 2011-09-14 LAB — BASIC METABOLIC PANEL
CO2: 28 mEq/L (ref 19–32)
Calcium: 8.7 mg/dL (ref 8.4–10.5)
Chloride: 106 mEq/L (ref 96–112)
Glucose, Bld: 103 mg/dL — ABNORMAL HIGH (ref 70–99)
Potassium: 3.5 mEq/L (ref 3.5–5.1)
Sodium: 141 mEq/L (ref 135–145)

## 2011-09-14 MED ORDER — SPIRONOLACTONE 25 MG PO TABS: 25.0000 mg | ORAL_TABLET | Freq: Every day | ORAL | Status: DC

## 2011-09-14 MED ORDER — ASPIRIN EC 81 MG PO TBEC: 81.0000 mg | DELAYED_RELEASE_TABLET | Freq: Every day | ORAL | Status: DC

## 2011-09-14 MED ORDER — POTASSIUM CHLORIDE CRYS ER 20 MEQ PO TBCR
40.0000 meq | EXTENDED_RELEASE_TABLET | Freq: Once | ORAL | Status: AC
  Administered 2011-09-14: 40 meq via ORAL
  Filled 2011-09-14: qty 2

## 2011-09-14 NOTE — Discharge Summary (Addendum)
Discharge Summary  Rogen Messmer MR#: [id number] DOB:12-14-1955  Date of Admission: 09/11/2011 Date of Discharge: 09/14/2011  Patient's PCP: Leanor Rubenstein, MD Primary cardiologist: Dr. Varney Daily  Attending Physician:Dangela How  Consults: None      Discharge Diagnoses: 1. Transient altered mental status, resolved. Unclear etiology. 2. Uncontrolled hypertension. 3. Hypokalemia 4. Thrombocytopenia   Brief Admitting History and Physical Patient is a 55 year old African American male patient with history of long-standing hypertension, gout who went to the lawyers office on Monday. After finishing the meeting his wife noticed that patient was confused and was unable to locate the car that he had parked in the parking lot. While driving home he could not find his way home. On reaching home he continued to be confused. He was brought to the emergency room where his head CT was negative for acute findings. His blood pressure was 191/110 mmHg and potassium was 2.8. He was admitted for further evaluation.    Discharge Medications Current Discharge Medication List    START taking these medications   Details  spironolactone (ALDACTONE) 25 MG tablet Take 1 tablet (25 mg total) by mouth daily. Qty: 30 tablet, Refills: 0      CONTINUE these medications which have CHANGED   Details  aspirin EC 81 MG tablet Take 1 tablet (81 mg total) by mouth daily.      CONTINUE these medications which have NOT CHANGED   Details  carvedilol (COREG) 12.5 MG tablet Take 12.5 mg by mouth 2 (two) times daily with a meal.      colchicine 0.6 MG tablet Take 0.6 mg by mouth daily as needed. As needed for gout     fluticasone (FLONASE) 50 MCG/ACT nasal spray Place 2 sprays into the nose daily as needed. As needed for allergies     HYDROcodone-acetaminophen (NORCO) 5-325 MG per tablet Take 1 tablet by mouth every 4 (four) hours as needed. For pain.     ibuprofen (ADVIL,MOTRIN) 800 MG tablet  Take 800 mg by mouth daily as needed. pain     lisinopril (PRINIVIL,ZESTRIL) 40 MG tablet Take 40 mg by mouth daily.      methocarbamol (ROBAXIN) 500 MG tablet Take 500 mg by mouth 3 (three) times daily.          Hospital Course: 1. Transient altered mental status. The etiology continues to be unclear. Extensive workup including CT of the head, MRI of the head, EEG, blood work including TSH, vitamin B 12, urine drug screen, ammonia levels and RPR were all negative. This lasted for 12-24 hours and resolved spontaneously. Patient does not recollect the event when he was confused. His wife indicates that his mental status is back to baseline. If he continues to have repeated episodes of confusion as an outpatient and consider neurology consultation. 2. Hypokalemia: This seems to be a veteran problem according to his spouse. Patient does not have any nausea vomiting or diarrhea. He is not on diuretics. Given his history of difficult to control hypertension and hypokalemia evaluation for secondary causes of hypertension were initiated. MRA of the abdomen is negative for renal artery stenosis. The Renin Aldosterone ratio is pending. After discussing with his primary cardiologist patient is being initiated on Aldactone which would help his hypertension as well as hypokalemia. 3. Uncontrolled hypertension. Continue home dose of lisinopril and Coreg. Add Aldactone. Followup with his primary cardiologist in the next 4-5 days. 4. Thrombocytopenia: Possibly chronic. Stable. Etiology unclear. Outpatient followup with his primary care physician. 5. History of gout:  No acute episodes in the hospital. 6. Multiple hepatic cysts on MRA abdomen: Outpatient followup as deemed necessary.     Day of Discharge BP 130/76  Pulse 57  Temp(Src) 97.8 F (36.6 C) (Oral)  Resp 20  Ht 5\' 11"  (1.803 m)  Wt 81.2 kg (179 lb 0.2 oz)  BMI 24.97 kg/m2  SpO2 96%  General exam: Patient is comfortable in no obvious  distress. Respiratory system: Clear. Cardiovascular system: S1 and S2 heard regular. Telemetry shows mostly bradycardia in the 50s. Gastrointestinal system: Abdomen is nondistended, soft and normal bowel sounds heard. No venous hums or bruits. Central nervous system: Alert and oriented. No focal neurological deficits. Extremities: 5 x 5 power symmetrical.  Results for orders placed during the hospital encounter of 09/11/11 (from the past 48 hour(s))  AMMONIA     Status: Normal   Collection Time   09/12/11  3:40 PM      Component Value Range Comment   Ammonia 34  11 - 60 (umol/L)   BASIC METABOLIC PANEL     Status: Abnormal   Collection Time   09/12/11 10:18 PM      Component Value Range Comment   Sodium 144  135 - 145 (mEq/L)    Potassium 3.4 (*) 3.5 - 5.1 (mEq/L)    Chloride 108  96 - 112 (mEq/L)    CO2 27  19 - 32 (mEq/L)    Glucose, Bld 109 (*) 70 - 99 (mg/dL)    BUN 13  6 - 23 (mg/dL)    Creatinine, Ser 1.61  0.50 - 1.35 (mg/dL)    Calcium 8.8  8.4 - 10.5 (mg/dL)    GFR calc non Af Amer 75 (*) >90 (mL/min)    GFR calc Af Amer 87 (*) >90 (mL/min)   BASIC METABOLIC PANEL     Status: Abnormal   Collection Time   09/13/11  5:12 PM      Component Value Range Comment   Sodium 144  135 - 145 (mEq/L)    Potassium 3.7  3.5 - 5.1 (mEq/L)    Chloride 107  96 - 112 (mEq/L)    CO2 29  19 - 32 (mEq/L)    Glucose, Bld 92  70 - 99 (mg/dL)    BUN 11  6 - 23 (mg/dL)    Creatinine, Ser 0.96  0.50 - 1.35 (mg/dL)    Calcium 8.7  8.4 - 10.5 (mg/dL)    GFR calc non Af Amer 74 (*) >90 (mL/min)    GFR calc Af Amer 86 (*) >90 (mL/min)   CBC     Status: Abnormal   Collection Time   09/14/11  6:20 AM      Component Value Range Comment   WBC 9.8  4.0 - 10.5 (K/uL)    RBC 5.49  4.22 - 5.81 (MIL/uL)    Hemoglobin 16.2  13.0 - 17.0 (g/dL)    HCT 04.5  40.9 - 81.1 (%)    MCV 85.6  78.0 - 100.0 (fL)    MCH 29.5  26.0 - 34.0 (pg)    MCHC 34.5  30.0 - 36.0 (g/dL)    RDW 91.4  78.2 - 95.6 (%)     Platelets 127 (*) 150 - 400 (K/uL)   BASIC METABOLIC PANEL     Status: Abnormal   Collection Time   09/14/11  6:20 AM      Component Value Range Comment   Sodium 141  135 - 145 (mEq/L)  Potassium 3.5  3.5 - 5.1 (mEq/L)    Chloride 106  96 - 112 (mEq/L)    CO2 28  19 - 32 (mEq/L)    Glucose, Bld 103 (*) 70 - 99 (mg/dL)    BUN 11  6 - 23 (mg/dL)    Creatinine, Ser 5.63  0.50 - 1.35 (mg/dL)    Calcium 8.7  8.4 - 10.5 (mg/dL)    GFR calc non Af Amer 76 (*) >90 (mL/min)    GFR calc Af Amer 88 (*) >90 (mL/min)     Ct Head Wo Contrast  09/11/2011  *RADIOLOGY REPORT*  Clinical Data:   Memory loss.  Fingertip numbness.  CT HEAD WITHOUT CONTRAST  Technique:  Contiguous axial images were obtained from the base of the skull through the vertex without contrast.  Comparison: None  Findings: There is no intra or extra-axial fluid collection or mass lesion.  The basilar cisterns and ventricles have a normal appearance.  There is no CT evidence for acute infarction or hemorrhage.  Bone windows show no acute finding.  IMPRESSION: Negative exam.  Original Report Authenticated By: Patterson Hammersmith, M.D.   Mr Brain Wo Contrast  09/12/2011  *RADIOLOGY REPORT*  Clinical Data: Mental status  MRI HEAD WITHOUT CONTRAST  Technique:  Multiplanar, multiecho pulse sequences of the brain and surrounding structures were obtained according to standard protocol without intravenous contrast.  Comparison: CT 09/11/2011  Findings: Low lying cerebellar tonsils particularly  on the right side.  The right tonsil is 8 mm below the foramen magnum.  Negative for hydrocephalus.  Negative for acute infarct.  Negative for demyelinating disease. Brainstem is normal.  Small hyperintensities in the basal ganglia and thalami may represent perivascular spaces versus small chronic infarct.  Tiny chronic infarct left cerebellum.  Negative for hemorrhage or fluid collection.  Negative for mass or edema.  Chronic sinusitis with mucosal edema  in the maxillary sinus bilaterally.  IMPRESSION: No acute intracranial abnormality.  Mild chronic sinusitis.  Low lying cerebellar tonsil on the right, probably an incidental finding.  Original Report Authenticated By: Camelia Phenes, M.D.   Mr Angiogram Abdomen W Contrast  09/14/2011  *RADIOLOGY REPORT*  Clinical Data:  Severe hypertension.  MRA ABDOMEN WITH AND WITHOUT CONTRAST  Technique:  Angiographic images of the abdomen were obtained using MRA technique with intravenous contrast.  Contrast:  17mL MULTIHANCE GADOBENATE DIMEGLUMINE 529 MG/ML IV SOLN  Comparison:  None  Findings:  The abdominal aorta and its branches are adequately demonstrated.  There was some respiratory motion artifact on the acquisition.  Single renal arteries are well visualized and show no evidence of stenosis.  The aorta is of normal caliber.  Celiac axis, superior mesenteric artery and inferior mesenteric artery show normal patency.  Visualized common iliac arteries are normal in caliber bilaterally.  Delayed imaging shows normal enhancement of the kidneys bilaterally and normal patency of bilateral renal veins.  Multiple cystic abnormalities are identified in both right and left lobes of the liver.  Based on the limited contrast enhanced sequences obtained on a delayed basis, these are most likely benign cysts.  Correlation suggested with any previous outside imaging of the liver as there are quite a few cystic lesions present throughout the liver.  The largest in the lateral segment of the left lobe measures approximately 2.3 cm.  IMPRESSION:  1.  No evidence of renal artery stenosis. 2.  Multiple hepatic cysts.  These are not completely evaluated due to the nature of MRA  acquisition and limited postcontrast imaging. These are likely benign.  Correlation is suggested, however, with any previous imaging of the liver.  Original Report Authenticated By: Reola Calkins, M.D.     Disposition:Patient is discharged home in stable  condition   Diet: Low-salt heart healthy diet  Activity: Ad lib.  Follow-up Appts: Discharge Orders    Future Orders Please Complete By Expires   Diet - low sodium heart healthy      Activity as tolerated - No restrictions      No wound care      Call MD for:  persistant dizziness or light-headedness      Call MD for:  severe uncontrolled pain      Call MD for:  difficulty breathing, headache or visual disturbances         TESTS THAT NEED FOLLOW-UP CBC and BMP in 4-5 days from hospital discharge  Time spent on discharge, talking to the patient, and coordinating care:30 mins.  Patient's care was discussed at length with his spouse yesterday and today at his request, in detail.   SignedMarcellus Scott, MD 09/14/2011, 2:51 PM

## 2011-09-14 NOTE — Progress Notes (Signed)
BP was elevated upon shift change, day RN administered prn med as ordered.  MD on call was notified as well, ordered new BP meds and dosages.  Pt and wife verbalized concern with receiving Norvasc as they stated it was d/c by earlier MD, because it caused gum hyperplasia. MD on call was notified.  Med was d/c and changed to more appropriate dose.  Angelique Blonder, RN

## 2011-09-15 ENCOUNTER — Encounter: Payer: Self-pay | Admitting: Internal Medicine

## 2011-09-21 ENCOUNTER — Encounter (HOSPITAL_COMMUNITY): Payer: Self-pay | Admitting: Internal Medicine

## 2011-09-25 LAB — ALDOSTERONE + RENIN ACTIVITY W/ RATIO

## 2013-07-17 ENCOUNTER — Ambulatory Visit (INDEPENDENT_AMBULATORY_CARE_PROVIDER_SITE_OTHER): Payer: PRIVATE HEALTH INSURANCE | Admitting: Cardiovascular Disease

## 2013-07-17 ENCOUNTER — Encounter: Payer: Self-pay | Admitting: Cardiovascular Disease

## 2013-07-17 VITALS — BP 138/82 | HR 62 | Ht 71.0 in | Wt 191.8 lb

## 2013-07-17 DIAGNOSIS — R7982 Elevated C-reactive protein (CRP): Secondary | ICD-10-CM

## 2013-07-17 DIAGNOSIS — E7889 Other lipoprotein metabolism disorders: Secondary | ICD-10-CM

## 2013-07-17 DIAGNOSIS — R55 Syncope and collapse: Secondary | ICD-10-CM | POA: Insufficient documentation

## 2013-07-17 DIAGNOSIS — E2609 Other primary hyperaldosteronism: Secondary | ICD-10-CM | POA: Insufficient documentation

## 2013-07-17 DIAGNOSIS — E7841 Elevated Lipoprotein(a): Secondary | ICD-10-CM

## 2013-07-17 DIAGNOSIS — E269 Hyperaldosteronism, unspecified: Secondary | ICD-10-CM

## 2013-07-17 DIAGNOSIS — I10 Essential (primary) hypertension: Secondary | ICD-10-CM

## 2013-07-17 DIAGNOSIS — R42 Dizziness and giddiness: Secondary | ICD-10-CM

## 2013-07-17 DIAGNOSIS — R9431 Abnormal electrocardiogram [ECG] [EKG]: Secondary | ICD-10-CM

## 2013-07-17 NOTE — Patient Instructions (Addendum)
You are doing well. No medication changes were made.  Please call us if you have new issues that need to be addressed before your next appt.  Your physician wants you to follow-up in: 12 months.  You will receive a reminder letter in the mail two months in advance. If you don't receive a letter, please call our office to schedule the follow-up appointment. 

## 2013-07-17 NOTE — Assessment & Plan Note (Signed)
We have discussed his elevated CRP and lipoprotein (a) with him. Cholesterol is in a reasonable range. No diabetes. We have recommended diet, exercise in an effort to lose weight. Could recheck his numbers next year for improvement. We did discuss statins. We'll hold off for now.

## 2013-07-17 NOTE — Progress Notes (Signed)
Patient ID: Duane Warren, male    DOB: 03/28/1956, 57 y.o.   MRN: [id number]  HPI Comments: Duane Warren is a pleasant 57 year old gentleman with no known cardiac disease who recently moved to the area with his wife who is also a patient of the clinic who presents to establish care. He has a history of gout, hernia repair, knee arthroscopy. 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  Reports having dizzy spells in the past. He was living in [place]. Workup at that time included treadmill and echocardiogram which by his report were normal. He was told that he might have depression and was started on a medication for depression. He did not feel well and was weaned off the medication. It was recommended that he start an alternate medication but he did not start this. He reports that since then, his dizziness has resolved. He feels well with no complaints.  Is relatively active, no shortness of breath or chest pain with activity.   he had extensive lab work in March 2014. Total cholesterol 189, HDL 56, LDL 104, lipoprotein (a) 61, CRP 3.4, hemoglobin A1c 5.5, normal LFTs, testosterone 387, Apo B 72 TSH 2.1 Reports that there is mild family history of cancer, hypertension him a CAD Blood pressure at home is typically in the 120-130 range over 60-70  Previous EKG from 2012 showed diffuse ST and T wave abnormalities EKG today shows normal sinus rhythm with rate 62 beats per minute with diffuse T-wave abnormality in V2 through V6, as well as anterolateral leads, inferior leads  unchanged from prior EKG   Echocardiogram has been requested from [place] Notes provided from cardiology for lightheadedness and fatigue.   Outpatient Encounter Prescriptions as of 07/17/2013  Medication Sig Dispense Refill  . amLODipine (NORVASC) 10 MG tablet Take 10 mg by mouth daily.      [name] aspirin EC 81 MG tablet Take 1 tablet (81 mg total) by mouth daily.      . colchicine 0.6 MG  tablet Take 0.6 mg by mouth daily as needed. As needed for gout       . fluticasone (FLONASE) 50 MCG/ACT nasal spray Place 2 sprays into the nose daily as needed. As needed for allergies       . losartan (COZAAR) 50 MG tablet Take 50 mg by mouth daily.      [name] spironolactone (ALDACTONE) 25 MG tablet Take 12.5 mg by mouth daily.        Review of Systems  Constitutional: Negative.   HENT: Negative.   Eyes: Negative.   Respiratory: Negative.   Cardiovascular: Negative.   Gastrointestinal: Negative.   Endocrine: Negative.   Musculoskeletal: Negative.   Skin: Negative.   Allergic/Immunologic: Negative.   Neurological: Negative.   Hematological: Negative.   Psychiatric/Behavioral: Negative.   All other systems reviewed and are negative.    BP 138/82  Pulse 62  Ht 5\' 11"  (1.803 m)  Wt 191 lb 12 oz (86.977 kg)  BMI 26.76 kg/m2  Physical Exam  Nursing note and vitals reviewed. Constitutional: He is oriented to person, place, and time. He appears well-developed and well-nourished.  HENT:  Head: Normocephalic.  Nose: Nose normal.  Mouth/Throat: Oropharynx is clear and moist.  Eyes: Conjunctivae are normal. Pupils are equal, round, and reactive to light.  Neck: Normal range of motion. Neck supple. No JVD present.  Cardiovascular: Normal rate, regular rhythm, S1 normal, S2 normal, normal heart sounds and intact distal pulses.  Exam  reveals no gallop and no friction rub.   No murmur heard. Pulmonary/Chest: Effort normal and breath sounds normal. No respiratory distress. He has no wheezes. He has no rales. He exhibits no tenderness.  Abdominal: Soft. Bowel sounds are normal. He exhibits no distension. There is no tenderness.  Musculoskeletal: Normal range of motion. He exhibits no edema and no tenderness.  Lymphadenopathy:    He has no cervical adenopathy.  Neurological: He is alert and oriented to person, place, and time. Coordination normal.  Skin: Skin is warm and dry. No rash  noted. No erythema.  Psychiatric: He has a normal mood and affect. His behavior is normal. Judgment and thought content normal.      Assessment and Plan

## 2013-07-17 NOTE — Assessment & Plan Note (Signed)
Reports that his dizziness symptoms have resolved. Prior workup was negative. No further workup at this time. Suggested he monitor his blood pressure if symptoms recur

## 2013-07-17 NOTE — Assessment & Plan Note (Signed)
Blood pressure is well controlled on today's visit. No changes made to the medications. 

## 2013-07-17 NOTE — Assessment & Plan Note (Signed)
He takes Aldactone. BMP Followed by his primary care physician.

## 2013-07-17 NOTE — Assessment & Plan Note (Signed)
He reports normal treadmill in the past. We'll try to obtain his prior echocardiogram from [place]. Suspect he has LVH. No outflow tract obstruction murmur on exam

## 2014-10-28 ENCOUNTER — Ambulatory Visit (INDEPENDENT_AMBULATORY_CARE_PROVIDER_SITE_OTHER): Payer: PRIVATE HEALTH INSURANCE | Admitting: Cardiovascular Disease

## 2014-10-28 ENCOUNTER — Encounter: Payer: Self-pay | Admitting: Cardiovascular Disease

## 2014-10-28 VITALS — BP 142/82 | HR 63 | Ht 71.0 in | Wt 201.8 lb

## 2014-10-28 DIAGNOSIS — E7889 Other lipoprotein metabolism disorders: Secondary | ICD-10-CM

## 2014-10-28 DIAGNOSIS — E2609 Other primary hyperaldosteronism: Secondary | ICD-10-CM

## 2014-10-28 DIAGNOSIS — I10 Essential (primary) hypertension: Secondary | ICD-10-CM

## 2014-10-28 DIAGNOSIS — R42 Dizziness and giddiness: Secondary | ICD-10-CM

## 2014-10-28 DIAGNOSIS — R9431 Abnormal electrocardiogram [ECG] [EKG]: Secondary | ICD-10-CM

## 2014-10-28 DIAGNOSIS — E7841 Elevated Lipoprotein(a): Secondary | ICD-10-CM

## 2014-10-28 NOTE — Assessment & Plan Note (Signed)
He denies any lightheadedness or dizziness on today's visit.

## 2014-10-28 NOTE — Assessment & Plan Note (Signed)
Blood pressure is well controlled on today's visit. No changes made to the medications. 

## 2014-10-28 NOTE — Assessment & Plan Note (Signed)
We've been having difficulty obtaining his prior echocardiogram for our records. These have again been requested from [place]

## 2014-10-28 NOTE — Patient Instructions (Signed)
You are doing well. No medication changes were made.  We will try to obtain a copy of your lab work for our review And try to obtain your previous echocardiogram  Please call us if you have new issues that need to be addressed before your next appt.  Your physician wants you to follow-up in: 12 months.  You will receive a reminder letter in the mail two months in advance. If you don't receive a letter, please call our office to schedule the follow-up appointment.

## 2014-10-28 NOTE — Assessment & Plan Note (Signed)
Most recent lab work not available. We'll try to obtain this for our records. Recommended weight loss, exercise. Weight is up significantly over the past year

## 2014-10-28 NOTE — Assessment & Plan Note (Signed)
Currently on Aldactone

## 2014-10-28 NOTE — Progress Notes (Signed)
Patient ID: Duane Warren, male    DOB: 09-25-1956, 59 y.o.   MRN: [id number]  HPI Comments: Duane Warren is a pleasant 59 year old gentleman with no known cardiac disease,  history of gout, hernia repair, knee arthroscopy. 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 He had some cardiac workup done in [place] for abnormal EKG. Reports having dizzy spells in the past. Workup at that time included treadmill and echocardiogram which by his report were normal. We have been unable to obtain his echocardiogram report.   In follow-up today, he reports that he is feeling relatively well. Weight is up more than 10 pounds from his prior clinic visit. He is not exercising much, eating more. He denies any shortness of breath or chest pain. Most recent lab work is not available to [place] today for review  EKG on today's visit shows normal sinus rhythm with APCs, rate 63 bpm, diffuse T-wave abnormality, no change from prior EKG . EKG unchanged compared to 2012  other past medical history  previouslyld that he might have depression and was started on a medication for depression. He did not feel well and was weaned off the medication. It was recommended that he start an alternate medication but he did not start this. He reports that since then, his dizziness has resolved.    he had extensive lab work in March 2014. Total cholesterol 189, HDL 56, LDL 104, lipoprotein (a) 61, CRP 3.4, hemoglobin A1c 5.5, normal LFTs, testosterone 387, Apo B 72 TSH 2.1 Reports that there is mild family history of cancer, hypertension him a CAD Blood pressure at home is typically in the 120-130 range over 60-70   No Known Allergies  Outpatient Encounter Prescriptions as of 10/28/2014  Medication Sig  . amLODipine (NORVASC) 10 MG tablet Take 10 mg by mouth daily.  [name] aspirin 81 MG tablet Take 81 mg by mouth daily.  . fluticasone (FLONASE) 50 MCG/ACT nasal spray Place into both nostrils daily.   [name] losartan (COZAAR) 50 MG tablet Take 50 mg by mouth daily.  [name] spironolactone (ALDACTONE) 25 MG tablet Take 25 mg by mouth daily.    Past Medical History  Diagnosis Date  . Hypertension   . Heart disease   . Heart murmur   . Enlarged heart     Past Surgical History  Procedure Laterality Date  . Hernia repair    . Skin graft    . Knee surgery      Social History  reports that he has never smoked. He does not have any smokeless tobacco history on file. He reports that he drinks about 0.6 oz of alcohol per week. He reports that he does not use illicit drugs.  Family History family history includes Heart attack in his father; Hypertension in his father and mother.  Review of Systems  Constitutional: Positive for fatigue and unexpected weight change.  Respiratory: Negative.   Cardiovascular: Negative.   Gastrointestinal: Negative.   Musculoskeletal: Negative.   Skin: Negative.   Neurological: Negative.   Hematological: Negative.   Psychiatric/Behavioral: Negative.   All other systems reviewed and are negative.   BP 142/82 mmHg  Pulse 63  Ht 5\' 11"  (1.803 m)  Wt 201 lb 12 oz (91.513 kg)  BMI 28.15 kg/m2  Physical Exam  Constitutional: He is oriented to person, place, and time. He appears well-developed and well-nourished.  HENT:  Head: Normocephalic.  Nose: Nose normal.  Mouth/Throat: Oropharynx is clear and moist.  Eyes:  Conjunctivae are normal. Pupils are equal, round, and reactive to light.  Neck: Normal range of motion. Neck supple. No JVD present.  Cardiovascular: Normal rate, regular rhythm, S1 normal, S2 normal, normal heart sounds and intact distal pulses.  Exam reveals no gallop and no friction rub.   No murmur heard. Pulmonary/Chest: Effort normal and breath sounds normal. No respiratory distress. He has no wheezes. He has no rales. He exhibits no tenderness.  Abdominal: Soft. Bowel sounds are normal. He exhibits no distension. There is no tenderness.   Musculoskeletal: Normal range of motion. He exhibits no edema or tenderness.  Lymphadenopathy:    He has no cervical adenopathy.  Neurological: He is alert and oriented to person, place, and time. Coordination normal.  Skin: Skin is warm and dry. No rash noted. No erythema.  Psychiatric: He has a normal mood and affect. His behavior is normal. Judgment and thought content normal.      Assessment and Plan   Nursing note and vitals reviewed.

## 2014-11-07 ENCOUNTER — Emergency Department: Payer: Self-pay | Admitting: Emergency Medicine

## 2014-11-11 DIAGNOSIS — D170 Benign lipomatous neoplasm of skin and subcutaneous tissue of head, face and neck: Secondary | ICD-10-CM | POA: Insufficient documentation

## 2014-11-11 DIAGNOSIS — M5412 Radiculopathy, cervical region: Secondary | ICD-10-CM | POA: Insufficient documentation

## 2015-03-23 ENCOUNTER — Encounter (HOSPITAL_COMMUNITY): Payer: Self-pay | Admitting: Physical Medicine and Rehabilitation

## 2015-03-23 ENCOUNTER — Emergency Department (HOSPITAL_COMMUNITY)
Admission: EM | Admit: 2015-03-23 | Discharge: 2015-03-23 | Disposition: A | Discharge status: Home / Self Care | Payer: PRIVATE HEALTH INSURANCE | Attending: Emergency Medicine | Admitting: Emergency Medicine

## 2015-03-23 DIAGNOSIS — Z7951 Long term (current) use of inhaled steroids: Secondary | ICD-10-CM | POA: Diagnosis not present

## 2015-03-23 DIAGNOSIS — I10 Essential (primary) hypertension: Secondary | ICD-10-CM | POA: Insufficient documentation

## 2015-03-23 DIAGNOSIS — R011 Cardiac murmur, unspecified: Secondary | ICD-10-CM | POA: Insufficient documentation

## 2015-03-23 DIAGNOSIS — R55 Syncope and collapse: Secondary | ICD-10-CM | POA: Insufficient documentation

## 2015-03-23 DIAGNOSIS — E2609 Other primary hyperaldosteronism: Secondary | ICD-10-CM | POA: Diagnosis not present

## 2015-03-23 DIAGNOSIS — H538 Other visual disturbances: Secondary | ICD-10-CM | POA: Diagnosis present

## 2015-03-23 DIAGNOSIS — R42 Dizziness and giddiness: Secondary | ICD-10-CM | POA: Diagnosis not present

## 2015-03-23 DIAGNOSIS — Z79899 Other long term (current) drug therapy: Secondary | ICD-10-CM | POA: Insufficient documentation

## 2015-03-23 DIAGNOSIS — Z7982 Long term (current) use of aspirin: Secondary | ICD-10-CM | POA: Diagnosis not present

## 2015-03-23 LAB — CBC WITH DIFFERENTIAL/PLATELET
BASOS ABS: 0 10*3/uL (ref 0.0–0.1)
BASOS PCT: 0 % (ref 0–1)
Eosinophils Absolute: 0.1 10*3/uL (ref 0.0–0.7)
Eosinophils Relative: 2 % (ref 0–5)
HCT: 47.5 % (ref 39.0–52.0)
HEMOGLOBIN: 17 g/dL (ref 13.0–17.0)
Lymphocytes Relative: 26 % (ref 12–46)
Lymphs Abs: 1.9 10*3/uL (ref 0.7–4.0)
MCH: 30.3 pg (ref 26.0–34.0)
MCHC: 35.8 g/dL (ref 30.0–36.0)
MCV: 84.7 fL (ref 78.0–100.0)
Monocytes Absolute: 0.5 10*3/uL (ref 0.1–1.0)
Monocytes Relative: 7 % (ref 3–12)
Neutro Abs: 4.7 10*3/uL (ref 1.7–7.7)
Neutrophils Relative %: 65 % (ref 43–77)
Platelets: 151 10*3/uL (ref 150–400)
RBC: 5.61 MIL/uL (ref 4.22–5.81)
RDW: 12.8 % (ref 11.5–15.5)
WBC: 7.3 10*3/uL (ref 4.0–10.5)

## 2015-03-23 LAB — COMPREHENSIVE METABOLIC PANEL
ALBUMIN: 4 g/dL (ref 3.5–5.0)
ALT: 21 U/L (ref 17–63)
AST: 23 U/L (ref 15–41)
Alkaline Phosphatase: 59 U/L (ref 38–126)
Anion gap: 10 (ref 5–15)
BILIRUBIN TOTAL: 1.1 mg/dL (ref 0.3–1.2)
BUN: 13 mg/dL (ref 6–20)
CHLORIDE: 106 mmol/L (ref 101–111)
CO2: 23 mmol/L (ref 22–32)
CREATININE: 1.23 mg/dL (ref 0.61–1.24)
Calcium: 9.1 mg/dL (ref 8.9–10.3)
GFR calc Af Amer: >60 mL/min (ref >60)
GLUCOSE: 110 mg/dL — AB (ref 65–99)
POTASSIUM: 4.2 mmol/L (ref 3.5–5.1)
Sodium: 139 mmol/L (ref 135–145)
Total Protein: 6.8 g/dL (ref 6.5–8.1)

## 2015-03-23 LAB — I-STAT TROPONIN, ED: TROPONIN I, POC: 0.06 ng/mL (ref 0.00–0.08)

## 2015-03-23 NOTE — ED Notes (Signed)
Pt presents to department for evaluation of near syncope and blurred vision. Ongoing x1 week. Pt is alert and oriented x4. No neurological deficits noted.

## 2015-03-23 NOTE — ED Provider Notes (Signed)
CSN: [id number]     Arrival date & time 03/23/15  1231 History   First MD Initiated Contact with Patient 03/23/15 1609     Chief Complaint  Patient presents with  . Near Syncope  . Blurred Vision     (Consider location/radiation/quality/duration/timing/severity/associated sxs/prior Treatment) Patient is a 59 y.o. male presenting with near-syncope and dizziness. The history is provided by the patient.  Near Syncope Pertinent negatives include no headaches, nausea, visual change, vomiting or weakness.  Dizziness Quality:  Lightheadedness ("eyes come together") Severity:  Mild Onset quality:  Sudden Duration:  4 hours Timing:  Sporadic (3 times in 2-3 weeks) Progression:  Resolved Chronicity:  New Context: not when bending over, not with head movement and not with loss of consciousness   Context comment:  Looking at phone this occurence Relieved by:  Nothing Worsened by:  Nothing Ineffective treatments:  None tried Associated symptoms: no headaches, no nausea, no shortness of breath, no syncope, no vision changes, no vomiting and no weakness   Risk factors: no anemia     Past Medical History  Diagnosis Date  . Hypertension   . Heart disease   . Heart murmur   . Enlarged heart    Past Surgical History  Procedure Laterality Date  . Hernia repair    . Skin graft    . Knee surgery     Family History  Problem Relation Age of Onset  . Hypertension Mother   . Heart attack Father   . Hypertension Father    History  Substance Use Topics  . Smoking status: Never Smoker   . Smokeless tobacco: Not on file  . Alcohol Use: 0.6 oz/week    1 Glasses of wine per week    Review of Systems  Respiratory: Negative for shortness of breath.   Cardiovascular: Positive for near-syncope. Negative for syncope.  Gastrointestinal: Negative for nausea and vomiting.  Neurological: Positive for dizziness. Negative for weakness and headaches.  All other systems reviewed and are  negative.     Allergies  Review of patient's allergies indicates no known allergies.  Home Medications   Prior to Admission medications   Medication Sig Start Date End Date Taking? Authorizing Provider  amLODipine (NORVASC) 10 MG tablet Take 10 mg by mouth daily. 09/17/14  Yes Historical Provider, MD  aspirin 81 MG tablet Take 81 mg by mouth daily.   Yes Historical Provider, MD  fluticasone (FLONASE) 50 MCG/ACT nasal spray Place 1 spray into both nostrils daily.    Yes Historical Provider, MD  losartan (COZAAR) 50 MG tablet Take 50 mg by mouth daily. 09/17/14  Yes Historical Provider, MD  spironolactone (ALDACTONE) 25 MG tablet Take 25 mg by mouth daily. 09/28/14  Yes Historical Provider, MD   BP 148/94 mmHg  Pulse 69  Temp(Src) 98.2 F (36.8 C) (Oral)  Resp 17  Ht 5\' 11"  (1.803 m)  SpO2 99% Physical Exam  Constitutional: He is oriented to person, place, and time. He appears well-developed and well-nourished. No distress.  HENT:  Head: Normocephalic and atraumatic.  Eyes: Conjunctivae are normal.  Neck: Neck supple. No tracheal deviation present.  Cardiovascular: Normal rate, regular rhythm and normal heart sounds.   Pulmonary/Chest: Effort normal and breath sounds normal. No respiratory distress. He has no wheezes.  Abdominal: Soft. He exhibits no distension. There is no tenderness.  Neurological: He is alert and oriented to person, place, and time. He has normal strength. No cranial nerve deficit. Coordination normal. GCS eye subscore is  4. GCS verbal subscore is 5. GCS motor subscore is 6.  Skin: Skin is warm and dry.  Psychiatric: He has a normal mood and affect.    ED Course  Procedures (including critical care time) Labs Review Labs Reviewed  COMPREHENSIVE METABOLIC PANEL - Abnormal; Notable for the following:    Glucose, Bld 110 (*)    All other components within normal limits  CBC WITH DIFFERENTIAL/PLATELET  I-STAT TROPOININ, ED    Imaging Review No results  found.   EKG Interpretation   Date/Time:  Tuesday March 23 2015 13:04:33 EDT Ventricular Rate:  65 PR Interval:  142 QRS Duration: 86 QT Interval:  418 QTC Calculation: 434 R Axis:   49 Text Interpretation:  Normal sinus rhythm Possible Left atrial enlargement  Left ventricular hypertrophy with repolarization abnormality Abnormal ECG  NO SIGNIFICANT CHANGE SINCE LAST TRACING YESTERDAY Confirmed by DOCHERTY   MD, MEGAN [phone]) on 03/23/2015 1:09:29 PM      MDM   Final diagnoses:  Lightheadedness     59 year old male with history of primary hyperaldosteronism and hypertension presents with a brief episode of lightheadedness with narrowing in vision that occurred while waiting for a friend at lunch.  The episode resolved spontaneously , the patient did not have any syncopal episodes or palpitations surrounding the event. He is otherwise well-appearing and has no neurologic deficits currently. He is not orthostatic. He did not take his medicine today. He had 2 prior episodes of this , one of which he did not take his medicines beforehand. He is normotensive on evaluation today. He is ambulatory without difficulty. I recommended that he follow-up closely with his primary care physician for reevaluation later date and return the emergency Department with any new or concerning symptoms.    Leo Grosser, MD 03/23/15 1736  Charlesetta Shanks, MD 03/25/15 [phone]

## 2015-03-23 NOTE — Discharge Instructions (Signed)

## 2015-09-01 ENCOUNTER — Emergency Department
Admission: EM | Admit: 2015-09-01 | Discharge: 2015-09-01 | Disposition: A | Discharge status: Home / Self Care | Payer: Commercial Managed Care - PPO | Attending: Emergency Medicine | Admitting: Emergency Medicine

## 2015-09-01 DIAGNOSIS — I10 Essential (primary) hypertension: Secondary | ICD-10-CM | POA: Insufficient documentation

## 2015-09-01 DIAGNOSIS — Z7982 Long term (current) use of aspirin: Secondary | ICD-10-CM | POA: Diagnosis not present

## 2015-09-01 DIAGNOSIS — Z79899 Other long term (current) drug therapy: Secondary | ICD-10-CM | POA: Diagnosis not present

## 2015-09-01 DIAGNOSIS — M10072 Idiopathic gout, left ankle and foot: Secondary | ICD-10-CM | POA: Insufficient documentation

## 2015-09-01 DIAGNOSIS — M79672 Pain in left foot: Secondary | ICD-10-CM | POA: Diagnosis present

## 2015-09-01 MED ORDER — COLCHICINE 0.6 MG PO TABS: 1.2000 mg | ORAL_TABLET | Freq: Once | ORAL | Status: DC

## 2015-09-01 MED ORDER — COLCHICINE 0.6 MG PO TABS
1.2000 mg | ORAL_TABLET | Freq: Once | ORAL | Status: AC
Start: 2015-09-01 — End: 2015-09-01
  Administered 2015-09-01: 1.2 mg via ORAL
  Filled 2015-09-01: qty 2

## 2015-09-01 MED ORDER — HYDROCODONE-ACETAMINOPHEN 5-325 MG PO TABS: 1.0000 | ORAL_TABLET | ORAL | Status: DC | PRN

## 2015-09-01 NOTE — Discharge Instructions (Signed)
Low-Purine Diet  Purines are compounds that affect the level of uric acid in your body. A low-purine diet is a diet that is low in purines. Eating a low-purine diet can prevent the level of uric acid in your body from getting too high and causing gout or kidney stones or both.  WHAT DO I NEED TO KNOW ABOUT THIS DIET?  · Choose low-purine foods. Examples of low-purine foods are listed in the next section.  · Drink plenty of fluids, especially water. Fluids can help remove uric acid from your body. Try to drink 8-16 cups (1.9-3.8 L) a day.  · Limit foods high in fat, especially saturated fat, as fat makes it harder for the body to get rid of uric acid. Foods high in saturated fat include pizza, cheese, ice cream, whole milk, fried foods, and gravies. Choose foods that are lower in fat and lean sources of protein. Use olive oil when cooking as it contains healthy fats that are not high in saturated fat.  · Limit alcohol. Alcohol interferes with the elimination of uric acid from your body. If you are having a gout attack, avoid all alcohol.  · Keep in mind that different people's bodies react differently to different foods. You will probably learn over time which foods do or do not affect you. If you discover that a food tends to cause your gout to flare up, avoid eating that food. You can more freely enjoy foods that do not cause problems. If you have any questions about a food item, talk to your dietitian or health care provider.  WHICH FOODS ARE LOW, MODERATE, AND HIGH IN PURINES?  The following is a list of foods that are low, moderate, and high in purines. You can eat any amount of the foods that are low in purines. You may be able to have small amounts of foods that are moderate in purines. Ask your health care provider how much of a food moderate in purines you can have. Avoid foods high in purines.  Grains  · Foods low in purines: Enriched white bread, pasta, rice, cake, cornbread, popcorn.  · Foods moderate in  purines: Whole-grain breads and cereals, wheat germ, bran, oatmeal. Uncooked oatmeal. Dry wheat bran or wheat germ.  · Foods high in purines: Pancakes, French toast, biscuits, muffins.  Vegetables  · Foods low in purines: All vegetables, except those that are moderate in purines.  · Foods moderate in purines: Asparagus, cauliflower, spinach, mushrooms, green peas.  Fruits  · All fruits are low in purines.  Meats and other Protein Foods  · Foods low in purines: Eggs, nuts, peanut butter.  · Foods moderate in purines: 80-90% lean beef, lamb, veal, pork, poultry, fish, eggs, peanut butter, nuts. Crab, lobster, oysters, and shrimp. Cooked dried beans, peas, and lentils.  · Foods high in purines: Anchovies, sardines, herring, mussels, tuna, codfish, scallops, trout, and haddock. Bacon. Organ meats (such as liver or kidney). Tripe. Game meat. Goose. Sweetbreads.  Dairy  · All dairy foods are low in purines. Low-fat and fat-free dairy products are best because they are low in saturated fat.  Beverages  · Drinks low in purines: Water, carbonated beverages, tea, coffee, cocoa.  · Drinks moderate in purines: Soft drinks and other drinks sweetened with high-fructose corn syrup. Juices. To find whether a food or drink is sweetened with high-fructose corn syrup, look at the ingredients list.  · Drinks high in purines: Alcoholic beverages (such as beer).  Condiments  · Foods   low in purines: Salt, herbs, olives, pickles, relishes, vinegar.  · Foods moderate in purines: Butter, margarine, oils, mayonnaise.  Fats and Oils  · Foods low in purines: All types, except gravies and sauces made with meat.  · Foods high in purines: Gravies and sauces made with meat.  Other Foods  · Foods low in purines: Sugars, sweets, gelatin. Cake. Soups made without meat.  · Foods moderate in purines: Meat-based or fish-based soups, broths, or bouillons. Foods and drinks sweetened with high-fructose corn syrup.  · Foods high in purines: High-fat desserts  (such as ice cream, cookies, cakes, pies, doughnuts, and chocolate).  Contact your dietitian for more information on foods that are not listed here.     This information is not intended to replace advice given to you by your health care provider. Make sure you discuss any questions you have with your health care provider.     Document Released: 01/27/2011 Document Revised: 10/07/2013 Document Reviewed: 09/08/2013  Elsevier Interactive Patient Education ©2016 [employer]    Gout  Gout is an inflammatory arthritis caused by a buildup of uric acid crystals in the joints. Uric acid is a chemical that is normally present in the blood. When the level of uric acid in the blood is too high it can form crystals that deposit in your joints and tissues. This causes joint redness, soreness, and swelling (inflammation). Repeat attacks are common. Over time, uric acid crystals can form into masses (tophi) near a joint, destroying bone and causing disfigurement. Gout is treatable and often preventable.  CAUSES   The disease begins with elevated levels of uric acid in the blood. Uric acid is produced by your body when it breaks down a naturally found substance called purines. Certain foods you eat, such as meats and fish, contain high amounts of purines. Causes of an elevated uric acid level include:  · Being passed down from parent to child (heredity).  · Diseases that cause increased uric acid production (such as obesity, psoriasis, and certain cancers).  · Excessive alcohol use.  · Diet, especially diets rich in meat and seafood.  · Medicines, including certain cancer-fighting medicines (chemotherapy), water pills (diuretics), and aspirin.  · Chronic kidney disease. The kidneys are no longer able to remove uric acid well.  · Problems with metabolism.  Conditions strongly associated with gout include:  · Obesity.  · High blood pressure.  · High cholesterol.  · Diabetes.  Not everyone with elevated uric acid levels gets gout. It  is not understood why some people get gout and others do not. Surgery, joint injury, and eating too much of certain foods are some of the factors that can lead to gout attacks.  SYMPTOMS   · An attack of gout comes on quickly. It causes intense pain with redness, swelling, and warmth in a joint.  · Fever can occur.  · Often, only one joint is involved. Certain joints are more commonly involved:    Base of the big toe.    Knee.    Ankle.    Wrist.    Finger.  Without treatment, an attack usually goes away in a few days to weeks. Between attacks, you usually will not have symptoms, which is different from many other forms of arthritis.  DIAGNOSIS   Your caregiver will suspect gout based on your symptoms and exam. In some cases, tests may be recommended. The tests may include:  · Blood tests.  · Urine tests.  · X-rays.  ·   Joint fluid exam. This exam requires a needle to remove fluid from the joint (arthrocentesis). Using a microscope, gout is confirmed when uric acid crystals are seen in the joint fluid.  TREATMENT   There are two phases to gout treatment: treating the sudden onset (acute) attack and preventing attacks (prophylaxis).  · Treatment of an Acute Attack.    Medicines are used. These include anti-inflammatory medicines or steroid medicines.    An injection of steroid medicine into the affected joint is sometimes necessary.    The painful joint is rested. Movement can worsen the arthritis.    You may use warm or cold treatments on painful joints, depending which works best for you.    Treatment to Prevent Attacks.    If you suffer from frequent gout attacks, your caregiver may advise preventive medicine. These medicines are started after the acute attack subsides. These medicines either help your kidneys eliminate uric acid from your body or decrease your uric acid production. You may need to stay on these medicines for a very long time.    The early phase of treatment with preventive medicine can be  associated with an increase in acute gout attacks. For this reason, during the first few months of treatment, your caregiver may also advise you to take medicines usually used for acute gout treatment. Be sure you understand your caregiver's directions. Your caregiver may make several adjustments to your medicine dose before these medicines are effective.    Discuss dietary treatment with your caregiver or dietitian. Alcohol and drinks high in sugar and fructose and foods such as meat, poultry, and seafood can increase uric acid levels. Your caregiver or dietitian can advise you on drinks and foods that should be limited.  HOME CARE INSTRUCTIONS   · Do not take aspirin to relieve pain. This raises uric acid levels.  · Only take over-the-counter or prescription medicines for pain, discomfort, or fever as directed by your caregiver.  · Rest the joint as much as possible. When in bed, keep sheets and blankets off painful areas.  · Keep the affected joint raised (elevated).  · Apply warm or cold treatments to painful joints. Use of warm or cold treatments depends on which works best for you.  · Use crutches if the painful joint is in your leg.  · Drink enough fluids to keep your urine clear or pale yellow. This helps your body get rid of uric acid. Limit alcohol, sugary drinks, and fructose drinks.  · Follow your dietary instructions. Pay careful attention to the amount of protein you eat. Your daily diet should emphasize fruits, vegetables, whole grains, and fat-free or low-fat milk products. Discuss the use of coffee, vitamin C, and cherries with your caregiver or dietitian. These may be helpful in lowering uric acid levels.  · Maintain a healthy body weight.  SEEK MEDICAL CARE IF:   · You develop diarrhea, vomiting, or any side effects from medicines.  · You do not feel better in 24 hours, or you are getting worse.  SEEK IMMEDIATE MEDICAL CARE IF:   · Your joint becomes suddenly more tender, and you have chills or a  fever.  MAKE SURE YOU:   · Understand these instructions.  · Will watch your condition.  · Will get help right away if you are not doing well or get worse.     This information is not intended to replace advice given to you by your health care provider. Make sure you discuss any

## 2015-09-01 NOTE — ED Notes (Signed)
Pt c/o left foot pain since last night with mild swelling.. Denies redness or injury.

## 2015-09-01 NOTE — ED Provider Notes (Signed)
[facility] Emergency Department Provider Note ____________________________________________  Time seen: Approximately 7:42 AM  I have reviewed the triage vital signs and the nursing notes.   HISTORY  Chief Complaint Foot Pain   HPI Duane Warren is a 59 y.o. male who presents to the emergency department for evaluation of left foot pain. He reports having a history of gout. No recent/frequent flares. He stopped his allopurinol because he hasn't "had any issues in a long time." He denies injury. Pain feels similar to previous gout flares. He has not taken anything for pain.    Past Medical History  Diagnosis Date  . Hypertension   . Heart disease   . Heart murmur   . Enlarged heart     Patient Active Problem List   Diagnosis Date Noted  . Primary aldosteronism ([place]) 07/17/2013  . Abnormal EKG 07/17/2013  . Dizziness 07/17/2013  . 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 Hypertension 09/12/2011    Past Surgical History  Procedure Laterality Date  . Hernia repair    . Skin graft    . Knee surgery      Current Outpatient Rx  Name  Route  Sig  Dispense  Refill  . amLODipine (NORVASC) 10 MG tablet   Oral   Take 10 mg by mouth daily.      0   . aspirin 81 MG tablet   Oral   Take 81 mg by mouth daily.         . colchicine 0.6 MG tablet   Oral   Take 2 tablets (1.2 mg total) by mouth once. If pain persists after 1 hour, take 1 additional pill. No more than 3 pills in 24 hours.   9 tablet   0   . fluticasone (FLONASE) 50 MCG/ACT nasal spray   Each Nare   Place 1 spray into both nostrils daily.          [name] HYDROcodone-acetaminophen (NORCO/VICODIN) 5-325 MG tablet   Oral   Take 1 tablet by mouth every 4 (four) hours as needed.   12 tablet   0   . losartan (COZAAR) 50 MG tablet   Oral   Take 50 mg by mouth daily.      0   . spironolactone (ALDACTONE) 25 MG tablet   Oral   Take 25 mg by mouth daily.      6      Allergies Review of patient's allergies indicates no known allergies.  Family History  Problem Relation Age of Onset  . Hypertension Mother   . Heart attack Father   . Hypertension Father     Social History Social History  Substance Use Topics  . Smoking status: Never Smoker   . Smokeless tobacco: None  . Alcohol Use: 0.6 oz/week    1 Glasses of wine per week    Review of Systems Constitutional: No recent illness. Eyes: No visual changes. ENT: No sore throat. Cardiovascular: Denies chest pain or palpitations. Respiratory: Denies shortness of breath. Gastrointestinal: No abdominal pain.  Genitourinary: Negative for dysuria. Musculoskeletal: Pain in left foot, worse near the great toe. Skin: Negative for rash. Neurological: Negative for headaches, focal weakness or numbness. 10-point ROS otherwise negative.  ____________________________________________   PHYSICAL EXAM:  VITAL SIGNS: ED Triage Vitals  Enc Vitals Group     BP 09/01/15 0718 146/84 mmHg     Pulse Rate 09/01/15 0718 100     Resp 09/01/15 0718 17     Temp 09/01/15  0718 98.5 F (36.9 C)     Temp src --      SpO2 09/01/15 0718 97 %     Weight 09/01/15 0717 195 lb (88.451 kg)     Height 09/01/15 0717 5\' 11"  (1.803 m)     Head Cir --      Peak Flow --      Pain Score 09/01/15 0717 8     Pain Loc --      Pain Edu? --      Excl. in [place]? --     Constitutional: Alert and oriented. Well appearing and in no acute distress. Eyes: Conjunctivae are normal. EOMI. Head: Atraumatic. Nose: No congestion/rhinnorhea. Neck: No stridor.  Respiratory: Normal respiratory effort.   Musculoskeletal: Erythematous, mildly edematous metacarpophalangeal joint left foot. Neurologic:  Normal speech and language. No gross focal neurologic deficits are appreciated. Speech is normal. No gait instability. Skin:  Skin is warm, dry and intact. Atraumatic. Psychiatric: Mood and affect are normal. Speech and behavior are  normal.  ____________________________________________   LABS (all labs ordered are listed, but only abnormal results are displayed)  Labs Reviewed - No data to display ____________________________________________  RADIOLOGY  Not indicated ____________________________________________   PROCEDURES  Procedure(s) performed: None   ____________________________________________   INITIAL IMPRESSION / ASSESSMENT AND PLAN / ED COURSE  Pertinent labs & imaging results that were available during my care of the patient were reviewed by me and considered in my medical decision making (see chart for details).  Colchicine 1.2mg  given in the ER. He was advised to take 1 additional tablet in an hour if no relief. He is to follow up with his primary care provider for symptoms that do not improve over the next 3 days. ____________________________________________   FINAL CLINICAL IMPRESSION(S) / ED DIAGNOSES  Final diagnoses:  Acute idiopathic gout of left foot       Victorino Dike, FNP 09/01/15 [id number]  Earleen Newport, MD 09/01/15 [phone]

## 2015-12-29 ENCOUNTER — Encounter: Payer: Self-pay | Admitting: Cardiovascular Disease

## 2015-12-29 ENCOUNTER — Ambulatory Visit (INDEPENDENT_AMBULATORY_CARE_PROVIDER_SITE_OTHER): Payer: No Typology Code available for payment source | Admitting: Cardiovascular Disease

## 2015-12-29 VITALS — BP 124/80 | HR 63 | Ht 71.0 in | Wt 201.5 lb

## 2015-12-29 DIAGNOSIS — I10 Essential (primary) hypertension: Secondary | ICD-10-CM | POA: Diagnosis not present

## 2015-12-29 DIAGNOSIS — J301 Allergic rhinitis due to pollen: Secondary | ICD-10-CM | POA: Insufficient documentation

## 2015-12-29 DIAGNOSIS — I6529 Occlusion and stenosis of unspecified carotid artery: Secondary | ICD-10-CM | POA: Insufficient documentation

## 2015-12-29 DIAGNOSIS — R9431 Abnormal electrocardiogram [ECG] [EKG]: Secondary | ICD-10-CM

## 2015-12-29 DIAGNOSIS — I6523 Occlusion and stenosis of bilateral carotid arteries: Secondary | ICD-10-CM

## 2015-12-29 NOTE — Progress Notes (Signed)
Patient ID: Duane Warren, male    DOB: [birth year], 60 y.o.   MRN: [id number]  HPI Comments: Duane Warren is a pleasant 60 year old gentleman with no known cardiac disease,  history of gout, hernia repair, knee arthroscopy. 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 He had some cardiac workup done in [place] for abnormal EKG. Reports having dizzy spells in the past. Workup at that time included treadmill and echocardiogram which by his report were normal. We have been unable to obtain his echocardiogram report. He presents today for follow-up of his abnormal EKG  He reports that his weight has been trending upwards Actually review weight shows no change in his weight compared to one year ago, still 201 pounds Reports blood pressure relatively well-controlled Denies any new complaints, no shortness of breath, no leg edema No recent lab work available for review. Reports he had this done at [facility]  EKG shows no sinus rhythm with rate 68 bpm, diffuse T-wave abnormality to the anterior precordial leads, anterolateral leads  other past medical history  previouslyld that he might have depression and was started on a medication for depression. He did not feel well and was weaned off the medication. It was recommended that he start an alternate medication but he did not start this. He reports that since then, his dizziness has resolved.    he had extensive lab work in March 2014. Total cholesterol 189, HDL 56, LDL 104, lipoprotein (a) 61, CRP 3.4, hemoglobin A1c 5.5, normal LFTs, testosterone 387, Apo B 72 TSH 2.1 Reports that there is mild family history of cancer, hypertension him a CAD Blood pressure at home is typically in the 120-130 range over 60-70   No Known Allergies  Current Outpatient Prescriptions on File Prior to Visit  Medication Sig Dispense Refill  . amLODipine (NORVASC) 10 MG tablet Take 10 mg by mouth daily.  0  . aspirin 81 MG  tablet Take 81 mg by mouth daily.    [name] losartan (COZAAR) 50 MG tablet Take 50 mg by mouth daily.  0  . spironolactone (ALDACTONE) 25 MG tablet Take 25 mg by mouth daily.  6   No current facility-administered medications on file prior to visit.    Past Medical History  Diagnosis Date  . Hypertension   . Heart disease   . Heart murmur   . Enlarged heart     Past Surgical History  Procedure Laterality Date  . Hernia repair    . Skin graft    . Knee surgery      Social History  reports that he has never smoked. He does not have any smokeless tobacco history on file. He reports that he drinks about 0.6 oz of alcohol per week. He reports that he does not use illicit drugs.  Family History family history includes Heart attack in his father; Hypertension in his father and mother.     Review of Systems  Constitutional: Negative.   Respiratory: Negative.   Cardiovascular: Negative.   Gastrointestinal: Negative.   Musculoskeletal: Negative.   Skin: Negative.   Neurological: Negative.   Hematological: Negative.   Psychiatric/Behavioral: Negative.   All other systems reviewed and are negative.   BP 124/80 mmHg  Pulse 63  Ht 5\' 11"  (1.803 m)  Wt 201 lb 8 oz (91.4 kg)  BMI 28.12 kg/m2  Physical Exam  Constitutional: He is oriented to person, place, and time. He appears well-developed and well-nourished.  HENT:  Head:  Normocephalic.  Nose: Nose normal.  Mouth/Throat: Oropharynx is clear and moist.  Eyes: Conjunctivae are normal. Pupils are equal, round, and reactive to light.  Neck: Normal range of motion. Neck supple. No JVD present.  Cardiovascular: Normal rate, regular rhythm, S1 normal, S2 normal, normal heart sounds and intact distal pulses.  Exam reveals no gallop and no friction rub.   No murmur heard. Pulmonary/Chest: Effort normal and breath sounds normal. No respiratory distress. He has no wheezes. He has no rales. He exhibits no tenderness.  Abdominal: Soft.  Bowel sounds are normal. He exhibits no distension. There is no tenderness.  Musculoskeletal: Normal range of motion. He exhibits no edema or tenderness.  Lymphadenopathy:    He has no cervical adenopathy.  Neurological: He is alert and oriented to person, place, and time. Coordination normal.  Skin: Skin is warm and dry. No rash noted. No erythema.  Psychiatric: He has a normal mood and affect. His behavior is normal. Judgment and thought content normal.      Assessment and Plan   Nursing note and vitals reviewed.

## 2015-12-29 NOTE — Assessment & Plan Note (Signed)
Carotid ultrasound 2012 with mild plaquing bilaterally

## 2015-12-29 NOTE — Assessment & Plan Note (Signed)
We have discussed various treatment options for his abnormal EKG Recommended a CT coronary calcium score for risk stratification He did have a previous carotid ultrasound showing heterogeneous plaque in 2012

## 2015-12-29 NOTE — Assessment & Plan Note (Signed)
Blood pressure is well controlled on today's visit. No changes made to the medications. 

## 2015-12-29 NOTE — Patient Instructions (Addendum)
You are doing well. No medication changes were made.  We will order CT coronary calcium score for abnormal EKG Thursday, March 16 @ 1:00 [address],  [place]  There is a one-time fee of $150 due at the time of your procdure  Please call us if you have new issues that need to be addressed before your next appt.  Your physician wants you to follow-up in: 12 months.  You will receive a reminder letter in the mail two months in advance. If you don't receive a letter, please call our office to schedule the follow-up appointment.  Coronary Calcium Scan A coronary calcium scan is an imaging test used to look for deposits of calcium and other fatty materials (plaques) in the inner lining of the blood vessels of your heart (coronary arteries). These deposits of calcium and plaques can partly clog and narrow the coronary arteries without producing any symptoms or warning signs. This puts you at risk for a heart attack. This test can detect these deposits before symptoms develop.  LET [facility] CARE PROVIDER KNOW ABOUT:  Any allergies you have.  All medicines you are taking, including vitamins, herbs, eye drops, creams, and over-the-counter medicines.  Previous problems you or members of your family have had with the use of anesthetics.  Any blood disorders you have.  Previous surgeries you have had.  Medical conditions you have.  Possibility of pregnancy, if this applies. RISKS AND COMPLICATIONS Generally, this is a safe procedure. However, as with any procedure, complications can occur. This test involves the use of radiation. Radiation exposure can be dangerous to a pregnant woman and her unborn baby. If you are pregnant, you should not have this procedure done.  BEFORE THE PROCEDURE There is no special preparation for the procedure. PROCEDURE  You will need to undress and put on a hospital gown. You will need to remove any jewelry around your neck or  chest.  Sticky electrodes are placed on your chest and are connected to an electrocardiogram (EKG or electrocardiography) machine to recorda tracing of the electrical activity of your heart.  A CT scanner will take pictures of your heart. During this time, you will be asked to lie still and hold your breath for 2-3 seconds while a picture is being taken of your heart. AFTER THE PROCEDURE   You will be allowed to get dressed.  You can return to your normal activities after the scan is done.   This information is not intended to replace advice given to you by your health care provider. Make sure you discuss any questions you have with your health care provider.   Document Released: 03/30/2008 Document Revised: 10/07/2013 Document Reviewed: 06/09/2013 Elsevier Interactive Patient Education [employer].

## 2015-12-30 ENCOUNTER — Ambulatory Visit (INDEPENDENT_AMBULATORY_CARE_PROVIDER_SITE_OTHER)
Admission: RE | Admit: 2015-12-30 | Discharge: 2015-12-30 | Disposition: A | Discharge status: Home / Self Care | Payer: No Typology Code available for payment source | Source: Ambulatory Visit | Attending: Cardiovascular Disease | Admitting: Cardiovascular Disease

## 2015-12-30 DIAGNOSIS — R9431 Abnormal electrocardiogram [ECG] [EKG]: Secondary | ICD-10-CM

## 2015-12-30 DIAGNOSIS — I10 Essential (primary) hypertension: Secondary | ICD-10-CM

## 2016-01-03 ENCOUNTER — Telehealth: Payer: Self-pay | Admitting: Cardiovascular Disease

## 2016-01-03 DIAGNOSIS — I719 Aortic aneurysm of unspecified site, without rupture: Secondary | ICD-10-CM

## 2016-01-03 MED ORDER — ATORVASTATIN CALCIUM 20 MG PO TABS: 20.0000 mg | ORAL_TABLET | Freq: Every day | ORAL | Status: DC

## 2016-01-03 NOTE — Telephone Encounter (Signed)
-----   Message from [name], MD sent at 01/02/2016  9:52 AM EDT ----- CT coronary calcium score of 726 This is very elevated and  91st percent for his age, In an effort to slow down further coronary artery disease, Would recommend a statin, such as lipitor 20 mg daily Report also mentioned there may be a mildly dilated ascenduing aorta, Could consider an echocardiogram to confirm aorta size (3 cm and less is normal)

## 2016-01-03 NOTE — Telephone Encounter (Signed)
Left message on voicemail for patient to call back for results.

## 2016-01-03 NOTE — Telephone Encounter (Signed)
Patient wants recent scan test results. Please call.

## 2016-01-03 NOTE — Telephone Encounter (Signed)
Reviewed results with patient and sent in prescription for lipitor and placed order in for echocardiogram as well. He verbalized understanding of results, medication, and need for follow up echo at this time. He had no further questions and verbalized plan of care. Let him know to please call back if he should have any further questions.

## 2016-01-14 ENCOUNTER — Telehealth: Payer: Self-pay | Admitting: Cardiovascular Disease

## 2016-01-14 NOTE — Telephone Encounter (Signed)
Pt states he is having some side affects from the medication( Atorvastatin) he was given. States he is having muscle cramps and soreness. Also would like to know his results of his CT calcium score test. Please call.

## 2016-01-14 NOTE — Telephone Encounter (Signed)
Spoke w/ pt.  He reports myalgias since starting Lipitor 20 mg. Reviewed pt's CT Calcium Score results in depth and advised the importance of keeping his chol low to prevent further buildup. He has never taken chol meds before, unable to find his most recent lipid. Advised him that I will make Dr. Rockey Situ aware and call him back w/ his recommendation.

## 2016-01-14 NOTE — Telephone Encounter (Signed)
He could cut the pill in half, take 10 mg daily Goal LDL less than 70 Would check numbers in 2 months, liver and lipid  Significant coronary calcified plaque seen on recent CT scan

## 2016-01-17 MED ORDER — ATORVASTATIN CALCIUM 20 MG PO TABS
10.0000 mg | ORAL_TABLET | Freq: Every day | ORAL | Status: DC
Start: 2016-01-17 — End: 2016-02-21

## 2016-01-17 NOTE — Telephone Encounter (Signed)
Spoke w/ pt.  Advised him of Dr. Donivan Scull recommendation. He will try the reduced dose of Lipitor, but does not think he will be able to tolerate it at all, as he is still cramping after holding med completely over the weekend. Pt asks that Dr. Rockey Situ call him to "just talk about it" Pt transferred to scheduling, as he would like move his ECHO appt and set up something to come in and discuss w/ Dr. Rockey Situ.

## 2016-01-20 ENCOUNTER — Other Ambulatory Visit: Payer: Self-pay

## 2016-02-03 ENCOUNTER — Ambulatory Visit (INDEPENDENT_AMBULATORY_CARE_PROVIDER_SITE_OTHER): Payer: No Typology Code available for payment source

## 2016-02-03 ENCOUNTER — Encounter (INDEPENDENT_AMBULATORY_CARE_PROVIDER_SITE_OTHER): Payer: Self-pay

## 2016-02-03 ENCOUNTER — Other Ambulatory Visit: Payer: Self-pay

## 2016-02-03 DIAGNOSIS — I719 Aortic aneurysm of unspecified site, without rupture: Secondary | ICD-10-CM | POA: Diagnosis not present

## 2016-02-10 ENCOUNTER — Telehealth: Payer: Self-pay | Admitting: Cardiovascular Disease

## 2016-02-10 NOTE — Telephone Encounter (Signed)
Patient stopped by wanting Echo results. Please call patient. He is not happy that he hasn't received his results yet.

## 2016-02-14 NOTE — Telephone Encounter (Signed)
Reviewed results w/ pt.  Advised of Dr. Donivan Scull recommendation. Pt verbalizes understanding and will call back w/ any questions or concerns.

## 2016-02-14 NOTE — Telephone Encounter (Signed)
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

## 2016-02-21 ENCOUNTER — Other Ambulatory Visit: Payer: Self-pay

## 2016-02-21 MED ORDER — ATORVASTATIN CALCIUM 20 MG PO TABS: 10.0000 mg | ORAL_TABLET | Freq: Every day | ORAL | Status: DC

## 2016-02-21 NOTE — Telephone Encounter (Signed)
Refill sent atorvastatin.

## 2016-04-06 ENCOUNTER — Inpatient Hospital Stay: Admission: RE | Admit: 2016-04-06 | Payer: Self-pay | Source: Ambulatory Visit

## 2016-04-06 ENCOUNTER — Ambulatory Visit (INDEPENDENT_AMBULATORY_CARE_PROVIDER_SITE_OTHER): Payer: No Typology Code available for payment source | Admitting: Podiatry

## 2016-04-06 ENCOUNTER — Encounter: Payer: Self-pay | Admitting: Podiatry

## 2016-04-06 VITALS — BP 144/88 | HR 59 | Ht 71.0 in | Wt 195.0 lb

## 2016-04-06 DIAGNOSIS — M216X9 Other acquired deformities of unspecified foot: Secondary | ICD-10-CM | POA: Diagnosis not present

## 2016-04-06 DIAGNOSIS — M216X1 Other acquired deformities of right foot: Secondary | ICD-10-CM | POA: Insufficient documentation

## 2016-04-06 DIAGNOSIS — M7741 Metatarsalgia, right foot: Secondary | ICD-10-CM

## 2016-04-06 DIAGNOSIS — M7742 Metatarsalgia, left foot: Principal | ICD-10-CM

## 2016-04-06 DIAGNOSIS — M7740 Metatarsalgia, unspecified foot: Secondary | ICD-10-CM | POA: Diagnosis not present

## 2016-04-06 DIAGNOSIS — M21969 Unspecified acquired deformity of unspecified lower leg: Secondary | ICD-10-CM | POA: Insufficient documentation

## 2016-04-06 DIAGNOSIS — M216X2 Other acquired deformities of left foot: Secondary | ICD-10-CM | POA: Insufficient documentation

## 2016-04-06 NOTE — Patient Instructions (Signed)
Seen for bilateral foot pain. Noted of short and elevated first metatarsal bilateral. Both feet casted for orthotics.

## 2016-04-06 NOTE — Progress Notes (Signed)
SUBJECTIVE: 60 y.o. year old male presents stating, a couple of weeks ago had pain in right medial ankle, and been bothering under lateral column of forefoot right and left lateral ankle for duration of 3 weeks. Been checked out, x-rayed, NSAID medication and brace were dispensed. Now hurts on top of forefoot at about 2nd metatarsal shaft area. Patient thinks he has a flat foot. On feet not much since retired in 2012.  First foot pain was 4-5 years ago when Gout came in.   Positive for HTN and clogged artery and takes Lipitor like medication,   REVIEW OF SYSTEMS: A comprehensive review of systems was negative.  OBJECTIVE: DERMATOLOGIC EXAMINATION: No abnormal findings.  VASCULAR EXAMINATION OF LOWER LIMBS: Pedal pulses: All pedal pulses are palpable with normal pulsation.  Capillary Filling times within 3 seconds in all digits.  Temperature gradient from tibial crest to dorsum of foot is within normal bilateral.  NEUROLOGIC EXAMINATION OF THE LOWER LIMBS: Achilles DTR is present and within normal. All epicritic and tactile sensations grossly intact.   MUSCULOSKELETAL EXAMINATION: Positive for hypermobile first ray bilateral.  STJ hyperpronation bilateral.   RADIOGRAPHIC STUDIES:  AP View:  Short first Metatarsal -6, increased lateral deviation of CCJ bilateral. Lateral view:  Normal CYMA line, severely elevated first ray, sagging mid tarsal joints bilateral. No acute changes seen in osseous or articular surfaces.   ASSESSMENT: Metatarsalgia bilateral. Metatarsal deformity bilateral, Short first Metatarsal, elevated bilateral. Excess STJ Pronation bilateral.  PLAN: Reviewed clinical findings and available treatment options. Both feet casted for Orthotics.

## 2016-05-15 ENCOUNTER — Telehealth: Payer: Self-pay | Admitting: Cardiovascular Disease

## 2016-05-15 NOTE — Telephone Encounter (Signed)
Patient called and he is concerned with the side effects of the medication Lipitor. He is having chest discomfort and feels like it might be coming from the medication. Please call patient.

## 2016-05-15 NOTE — Telephone Encounter (Signed)
Would change Lipitor to Crestor Would send in 10 mg daily Would start 5 mg daily for the first several weeks Repeat lipid panel in several months time

## 2016-05-15 NOTE — Telephone Encounter (Signed)
Pt reports that he cannot tolerate Lipitor.   He would like to know if there is an alternate med he can try w/ fewer side effects.

## 2016-05-16 NOTE — Telephone Encounter (Signed)
Spoke w/ pt.  Advised him of Dr. Donivan Scull recommendation.  He reports that he feels dizzy today and "kind of off balance".   Asked him to check BP while I wait: 140/75. He reports that he has felt bad for the past few days and is unsure if sx are r/t his heart or his meds. He did not take his Lipitor last night. Advised him that if his sx continue, to call his PCP or proceed to ED if sx become emergent.  Asked him to call back w/ any further questions or concerns.

## 2016-05-19 ENCOUNTER — Telehealth: Payer: Self-pay | Admitting: Cardiovascular Disease

## 2016-05-19 ENCOUNTER — Emergency Department
Admission: EM | Admit: 2016-05-19 | Discharge: 2016-05-19 | Disposition: A | Discharge status: Home / Self Care | Payer: No Typology Code available for payment source | Attending: Emergency Medicine | Admitting: Emergency Medicine

## 2016-05-19 ENCOUNTER — Emergency Department: Payer: No Typology Code available for payment source

## 2016-05-19 DIAGNOSIS — Z79899 Other long term (current) drug therapy: Secondary | ICD-10-CM | POA: Insufficient documentation

## 2016-05-19 DIAGNOSIS — I10 Essential (primary) hypertension: Secondary | ICD-10-CM | POA: Diagnosis not present

## 2016-05-19 DIAGNOSIS — R42 Dizziness and giddiness: Secondary | ICD-10-CM | POA: Insufficient documentation

## 2016-05-19 DIAGNOSIS — Z7982 Long term (current) use of aspirin: Secondary | ICD-10-CM | POA: Insufficient documentation

## 2016-05-19 LAB — HEPATIC FUNCTION PANEL
ALT: 23 U/L (ref 17–63)
AST: 22 U/L (ref 15–41)
Albumin: 3.9 g/dL (ref 3.5–5.0)
Alkaline Phosphatase: 65 U/L (ref 38–126)
BILIRUBIN DIRECT: 0.1 mg/dL (ref 0.1–0.5)
BILIRUBIN INDIRECT: 0.5 mg/dL (ref 0.3–0.9)
Total Bilirubin: 0.6 mg/dL (ref 0.3–1.2)
Total Protein: 6.6 g/dL (ref 6.5–8.1)

## 2016-05-19 LAB — URINALYSIS COMPLETE WITH MICROSCOPIC (ARMC ONLY)
Bacteria, UA: NONE SEEN
Bilirubin Urine: NEGATIVE
Glucose, UA: NEGATIVE mg/dL
HGB URINE DIPSTICK: NEGATIVE
Ketones, ur: NEGATIVE mg/dL
Leukocytes, UA: NEGATIVE
NITRITE: NEGATIVE
PROTEIN: NEGATIVE mg/dL
RBC / HPF: NONE SEEN RBC/hpf (ref 0–5)
SPECIFIC GRAVITY, URINE: 1.019 (ref 1.005–1.030)
Squamous Epithelial / LPF: NONE SEEN
pH: 5 (ref 5.0–8.0)

## 2016-05-19 LAB — CBC
HCT: 52.3 % — ABNORMAL HIGH (ref 40.0–52.0)
HEMOGLOBIN: 18 g/dL (ref 13.0–18.0)
MCH: 29.6 pg (ref 26.0–34.0)
MCHC: 34.4 g/dL (ref 32.0–36.0)
MCV: 86.1 fL (ref 80.0–100.0)
Platelets: 166 10*3/uL (ref 150–440)
RBC: 6.07 MIL/uL — AB (ref 4.40–5.90)
RDW: 13.9 % (ref 11.5–14.5)
WBC: 10.1 10*3/uL (ref 3.8–10.6)

## 2016-05-19 LAB — BASIC METABOLIC PANEL
ANION GAP: 8 (ref 5–15)
BUN: 23 mg/dL — ABNORMAL HIGH (ref 6–20)
CALCIUM: 9.5 mg/dL (ref 8.9–10.3)
CO2: 26 mmol/L (ref 22–32)
Chloride: 108 mmol/L (ref 101–111)
Creatinine, Ser: 1.3 mg/dL — ABNORMAL HIGH (ref 0.61–1.24)
GFR, EST NON AFRICAN AMERICAN: 57 mL/min — AB (ref >60)
Glucose, Bld: 105 mg/dL — ABNORMAL HIGH (ref 65–99)
Potassium: 4.1 mmol/L (ref 3.5–5.1)
Sodium: 142 mmol/L (ref 135–145)

## 2016-05-19 LAB — TROPONIN I

## 2016-05-19 LAB — GLUCOSE, CAPILLARY: Glucose-Capillary: 112 mg/dL — ABNORMAL HIGH (ref 65–99)

## 2016-05-19 MED ORDER — ROSUVASTATIN CALCIUM 10 MG PO TABS: 10.0000 mg | ORAL_TABLET | Freq: Every day | ORAL | 3 refills | Status: DC

## 2016-05-19 MED ORDER — GADOBENATE DIMEGLUMINE 529 MG/ML IV SOLN
20.0000 mL | Freq: Once | INTRAVENOUS | Status: AC | PRN
  Administered 2016-05-19: 18 mL via INTRAVENOUS

## 2016-05-19 NOTE — Telephone Encounter (Signed)
[facility] calling states pt needs Crestor sent to pharmacy. States this was changed from Lipitor. Please call to pharmacy

## 2016-05-19 NOTE — ED Provider Notes (Signed)
[facility] Emergency Department Provider Note ____________________________________________  Time seen: Approximately 11:30 AM I have reviewed the triage vital signs and the triage nursing note.  HISTORY  Chief Complaint Dizziness   Historian Patient and wife  HPI Duane Warren is a 60 y.o. male with a history of hypertension, hyperlipidemia, and coronary artery disease, follows with Dr. Nehemiah Massed for cardiology and does have a primary care physician, is presenting today with dizziness and nausea. It sounds like symptoms started gradually on Monday late afternoon. Sunday he had been out in the heat, but did not necessarily feel like he had heat exhaustion or anything. The patient started feeling some lightheadedness and some nausea with eating. He has felt some off balance while walking. Some room spinning with movement of his head position. No fevers, coughing. On Monday he also had some chest discomfort which she thought may be due to Lipitor. He called the cardiologist office who asked him to discontinue Lipitor and he has not taken it since Monday. He was still having symptoms and so on Tuesday he saw his primary care physician in the office who told him that his ear looked normal and checked blood work which was normal, and diagnosed him with vertigo and placed him on meclizine. He states he had one more episode of vomiting which was not an association with dizziness or room spinning episode, and since she's been on meclizine he has had no more nausea.  He has never had vertigo before.  Denies focal weakness of the extremities or face, or any numbness. He states that when he walks he feels a little off balance. No slurred speech or altered mental status.    Past Medical History:  Diagnosis Date  . Enlarged heart   . Heart disease   . Heart murmur   . Hypertension     Patient Active Problem List   Diagnosis Date Noted  . Metatarsal deformity 04/06/2016   . 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 Carotid atherosclerosis 12/29/2015  . Primary aldosteronism ([place]) 07/17/2013  . Abnormal EKG 07/17/2013  . Dizziness 07/17/2013  . 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 Hypertension 09/12/2011    Past Surgical History:  Procedure Laterality Date  . HERNIA REPAIR    . KNEE SURGERY    . SKIN GRAFT      Prior to Admission medications   Medication Sig Start Date End Date Taking? Authorizing Provider  amLODipine (NORVASC) 10 MG tablet Take 10 mg by mouth daily. 09/17/14  Yes Historical Provider, MD  aspirin 81 MG tablet Take 81 mg by mouth daily.   Yes Historical Provider, MD  Cholecalciferol (VITAMIN D) 2000 units tablet Take 2,000 Units by mouth daily.   Yes Historical Provider, MD  losartan (COZAAR) 50 MG tablet Take 50 mg by mouth daily. 09/17/14  Yes Historical Provider, MD  spironolactone (ALDACTONE) 25 MG tablet Take 25 mg by mouth daily. 09/28/14  Yes Historical Provider, MD  rosuvastatin (CRESTOR) 10 MG tablet Take 1 tablet (10 mg total) by mouth daily. 05/19/16 08/17/16  [name], MD    No Known Allergies  Family History  Problem Relation Age of Onset  . Hypertension Mother   . Heart attack Father   . Hypertension Father     Social History Social History  Substance Use Topics  . Smoking status: Never Smoker  . Smokeless tobacco: Never Used  . Alcohol use 0.6 oz/week    1 Glasses of wine per week  Review of Systems  Constitutional: Negative for fever. Eyes: Negative for visual changes. ENT: Negative for sore throat. Cardiovascular: Some chest discomfort on Monday, but none since then. Respiratory: Negative for shortness of breath. Gastrointestinal: Negative for current abdominal pain or diarrhea. He states that he has had some left lower quadrant abdominal pain occasionally when he wakes up and he thinks it may be delayed that he slept. Chills. No constipation. Genitourinary: Negative for  dysuria. Musculoskeletal: Negative for back pain. Skin: Negative for rash. Neurological: Negative for headache. 10 point Review of Systems otherwise negative ____________________________________________   PHYSICAL EXAM:  VITAL SIGNS: ED Triage Vitals  Enc Vitals Group     BP 05/19/16 0115 128/73     Pulse Rate 05/19/16 1115 75     Resp 05/19/16 1115 18     Temp 05/19/16 1115 97.8 F (36.6 C)     Temp Source 05/19/16 1115 Oral     SpO2 05/19/16 1115 97 %     Weight 05/19/16 1115 190 lb (86.2 kg)     Height 05/19/16 1115 5\' 11"  (1.803 m)     Head Circumference --      Peak Flow --      Pain Score --      Pain Loc --      Pain Edu? --      Excl. in [place]? --      Constitutional: Alert and oriented. Well appearing and in no distress. HEENT   Head: Normocephalic and atraumatic.      Eyes: Conjunctivae are normal. PERRL. Normal extraocular movements.      Ears:         Nose: No congestion/rhinnorhea.   Mouth/Throat: Mucous membranes are moist.   Neck: No stridor. Nontender plum sized soft mass right posterior neck consistent with lipoma. Cardiovascular/Chest: Normal rate, regular rhythm.  No murmurs, rubs, or gallops. Respiratory: Normal respiratory effort without tachypnea nor retractions. Breath sounds are clear and equal bilaterally. No wheezes/rales/rhonchi. Gastrointestinal: Soft. No distention, no guarding, no rebound. Nontender.    Genitourinary/rectal:Deferred Musculoskeletal: Nontender with normal range of motion in all extremities. No joint effusions.  No lower extremity tenderness.  No edema. Neurologic: Alert and oriented 3. No facial droop. Cranial nerves II through X intact. Normal speech and language. No gross or focal neurologic deficits are appreciated.  Heel to shin intact bilaterally. Finger-nose intact bilaterally. No pronator drift. Some experience of dizziness with change of position without nystagmus. Skin:  Skin is warm, dry and intact. No rash  noted. Psychiatric: Mood and affect are normal. Speech and behavior are normal. Patient exhibits appropriate insight and judgment.  ____________________________________________   EKG I, Lisa Roca, MD, the attending physician have personally viewed and interpreted all ECGs.  62 bpm. Normal sinus rhythm. Narrow QRS. Normal axis. T-wave inversions inferior and laterally with ST depressions in similar distribution, and similar to prior EKGs. LVH with repolarization abnormality. ____________________________________________  LABS (pertinent positives/negatives)  Labs Reviewed  BASIC METABOLIC PANEL - Abnormal; Notable for the following:       Result Value   Glucose, Bld 105 (*)    BUN 23 (*)    Creatinine, Ser 1.30 (*)    GFR calc non Af Amer 57 (*)    All other components within normal limits  CBC - Abnormal; Notable for the following:    RBC 6.07 (*)    HCT 52.3 (*)    All other components within normal limits  GLUCOSE, CAPILLARY - Abnormal; Notable for the  following:    Glucose-Capillary 112 (*)    All other components within normal limits  TROPONIN I  HEPATIC FUNCTION PANEL  URINALYSIS COMPLETEWITH MICROSCOPIC ([facility] ONLY)  CBG MONITORING, ED    ____________________________________________  RADIOLOGY All Xrays were viewed by me. Imaging interpreted by Radiologist.  CT head without contrast: No acute abnormality noted  MR brain, MRA head and neck:  IMPRESSION: 1. No acute finding, including infarct. Negative cervical and intracranial MRA. 2. Mild mucosal thickening in right mastoid air cells.  __________________________________________  PROCEDURES  Procedure(s) performed: None  Critical Care performed: None  ____________________________________________   ED COURSE / ASSESSMENT AND PLAN  Pertinent labs & imaging results that were available during my care of the patient were reviewed by me and considered in my medical decision making (see chart for  details).   I do think his symptoms sound most consistent with vertigo, although the room spinning is not necessarily the most prominent symptom, is definitely worse with position and he does have some room spinning. He does not have other symptoms making me highly concerned about stroke.  I reviewed his labs in the system from a few days ago including TSH, urinalysis, CBC, metabolic panel which were all reassuring and without source for her nausea or dizziness.  He is well appearing overall, no ill appearance. I don't have a concern at this point for any ongoing cardiac cause for his dizziness and vertigo symptoms. His EKG is abnormal, but essentially similar to prior.  Ultimately he is concerned about the off-balance when he is walking. I will check a head CT to rule out intracranial cause.   CT head is negative.  Back to reexamine him and was actually able to get up and walk. He is noting feeling off-balance and does seem to be listing a little bit to the right. He reports that his symptoms do seem worse when he moves his head to the side to side, but denies room spinning.  I am going to MRI brain and MRA head and neck due to symptoms which are not entirely classic with vertigo, in order to rule out posterior circulation source of symptoms.  5:30: I reviewed normal findings on the MRI and MRA with patient and spouse. He is to be discharged home now. My working diagnosis is vertigo, and he will be referred both to his primary care physician as well as ENT.  CONSULTATIONS:   None   Patient / Family / Caregiver informed of clinical course, medical decision-making process, and agree with plan.   I discussed return precautions, follow-up instructions, and discharged instructions with patient and/or family.   ___________________________________________   FINAL CLINICAL IMPRESSION(S) / ED DIAGNOSES   Final diagnoses:  Dizziness  Vertigo              Note: This dictation  was prepared with Dragon dictation. Any transcriptional errors that result from this process are unintentional    Lisa Roca, MD 05/19/16 1737

## 2016-05-19 NOTE — ED Notes (Signed)
MD at bedside. 

## 2016-05-19 NOTE — ED Triage Notes (Signed)
Pt arrives to ER via POV c/o "feeling off balance" since Tuesday. Reports dizziness, worsening with movement. Denies visual changes, headache or confusion. Pt seen by PCP on Wednesday, had bloodwork drawn, started on meclizine and was to be referred to ENT. Pt states nausea is better but dizziness remains, worsening with moving head "side to side". Pt ambulates independently with ease. Pt alert and oriented X4, active, cooperative, pt in NAD. RR even and unlabored, color WNL.

## 2016-05-19 NOTE — Telephone Encounter (Signed)
Rx sent in and med list updated.

## 2016-05-19 NOTE — Discharge Instructions (Signed)
You were evaluated for dizziness and feeling off balance, and as we discussed, you examine evaluation are reassuring in the emergency department stay. I am suspicious that her symptoms are from vertigo. He may continue the meclizine. I am referring him to follow up with your primary care physician, and giving his office number for the ears nose throat specialist, Dr. Richardson Landry.  Return to the emergency department for any worsening condition including weakness or numbness, confusion or altered mental status, fever, slurred speech, chest pain, palpitations, trouble breathing or any other symptoms concerning to you.

## 2016-05-29 DIAGNOSIS — H9313 Tinnitus, bilateral: Secondary | ICD-10-CM | POA: Insufficient documentation

## 2016-05-29 DIAGNOSIS — H9042 Sensorineural hearing loss, unilateral, left ear, with unrestricted hearing on the contralateral side: Secondary | ICD-10-CM | POA: Insufficient documentation

## 2016-07-10 DIAGNOSIS — F5104 Psychophysiologic insomnia: Secondary | ICD-10-CM | POA: Insufficient documentation

## 2016-07-24 ENCOUNTER — Other Ambulatory Visit: Payer: Self-pay

## 2016-07-24 MED ORDER — ROSUVASTATIN CALCIUM 10 MG PO TABS: 10.0000 mg | ORAL_TABLET | Freq: Every day | ORAL | 3 refills | Status: DC

## 2016-07-26 ENCOUNTER — Other Ambulatory Visit: Payer: Self-pay | Admitting: *Deleted

## 2016-07-26 MED ORDER — ROSUVASTATIN CALCIUM 10 MG PO TABS: 10.0000 mg | ORAL_TABLET | Freq: Every day | ORAL | 3 refills | Status: DC

## 2016-08-18 DIAGNOSIS — M25552 Pain in left hip: Secondary | ICD-10-CM | POA: Insufficient documentation

## 2016-09-19 DIAGNOSIS — D3502 Benign neoplasm of left adrenal gland: Secondary | ICD-10-CM | POA: Insufficient documentation

## 2016-10-02 DIAGNOSIS — E559 Vitamin D deficiency, unspecified: Secondary | ICD-10-CM | POA: Insufficient documentation

## 2016-11-07 DIAGNOSIS — H81392 Other peripheral vertigo, left ear: Secondary | ICD-10-CM | POA: Insufficient documentation

## 2016-11-22 ENCOUNTER — Telehealth: Payer: Self-pay | Admitting: Cardiovascular Disease

## 2016-11-22 NOTE — Telephone Encounter (Signed)
Patient called in to let us know his blood pressures were in the 120's over 60's range. Let him know that this is considered a normal range and that we would like it to stay in this range. He verbalized understanding and has no further questions at this time.

## 2016-11-22 NOTE — Telephone Encounter (Signed)
Pt states his BP has been in the 120s /60s. Pt states this is low.  Pt states this has been going on for a couple weeks. Please call. Pt denies any symtoms. CP, nausea, dizziness.

## 2016-12-21 ENCOUNTER — Ambulatory Visit (INDEPENDENT_AMBULATORY_CARE_PROVIDER_SITE_OTHER): Payer: No Typology Code available for payment source | Admitting: Cardiovascular Disease

## 2016-12-21 ENCOUNTER — Encounter: Payer: Self-pay | Admitting: Cardiovascular Disease

## 2016-12-21 VITALS — BP 134/80 | HR 64 | Ht 71.0 in | Wt 194.5 lb

## 2016-12-21 DIAGNOSIS — I6523 Occlusion and stenosis of bilateral carotid arteries: Secondary | ICD-10-CM

## 2016-12-21 DIAGNOSIS — I10 Essential (primary) hypertension: Secondary | ICD-10-CM | POA: Diagnosis not present

## 2016-12-21 DIAGNOSIS — I2510 Atherosclerotic heart disease of native coronary artery without angina pectoris: Secondary | ICD-10-CM

## 2016-12-21 DIAGNOSIS — Z6835 Body mass index (BMI) 35.0-35.9, adult: Secondary | ICD-10-CM

## 2016-12-21 DIAGNOSIS — I422 Other hypertrophic cardiomyopathy: Secondary | ICD-10-CM

## 2016-12-21 DIAGNOSIS — E6609 Other obesity due to excess calories: Secondary | ICD-10-CM

## 2016-12-21 DIAGNOSIS — E782 Mixed hyperlipidemia: Secondary | ICD-10-CM

## 2016-12-21 MED ORDER — ROSUVASTATIN CALCIUM 10 MG PO TABS: 10.0000 mg | ORAL_TABLET | Freq: Every day | ORAL | 3 refills | Status: DC

## 2016-12-21 NOTE — Patient Instructions (Addendum)
We will call you with some details about the thickening of the apical region in the heart   Medication Instructions:   No medication changes made  Labwork:  We will check lipid and liver, And will call you with the results  Testing/Procedures:  No further testing at this time   I recommend watching educational videos on topics of interest to you at:       [email or web address]  Enter code: [email or web address]    Follow-Up: It was a pleasure seeing you in the office today. Please call us if you have new issues that need to be addressed before your next appt.  [phone]  Your physician wants you to follow-up in: 6 months.  You will receive a reminder letter in the mail two months in advance. If you don't receive a letter, please call our office to schedule the follow-up appointment.  If you need a refill on your cardiac medications before your next appointment, please call your pharmacy.

## 2016-12-21 NOTE — Progress Notes (Signed)
Cardiology Office Note  Date:  12/21/2016   ID:  Duane Warren, DOB [birth year], MRN [id number]  PCP:  Duane Balls, MD   Chief Complaint  Patient presents with  . other    12 month follow up. Patient states he is doing well. Meds reviewed verbally with patient.     HPI:  Duane Warren is a pleasant 61 year old gentleman with CAD,  history of gout, hernia repair, knee arthroscopy.  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 He had some cardiac workup done in [place] for abnormal EKG. Reports having dizzy spells in the past. Workup at that time included treadmill and echocardiogram which by his report were normal. We have been unable to obtain his echocardiogram report. 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   Reports blood pressure relatively well-controlled Denies any new complaints, no shortness of breath, no leg edema  EKG shows no sinus rhythm with rate 64 bpm, diffuse T-wave abnormality to the anterior precordial leads, anterolateral leads, now also inferior leads  other past medical history  previouslyld that he might have depression and was started on a medication for depression. He did not feel well and was weaned off the medication. It was recommended that he start an alternate medication but he did not start this. He reports that since then, his  dizziness has resolved.    he had extensive lab work in March 2014. Total cholesterol 189, HDL 56, LDL 104, lipoprotein (a) 61, CRP 3.4, hemoglobin A1c 5.5, normal LFTs, testosterone 387, Apo B 72 TSH 2.1 Reports that there is mild family history of cancer, hypertension him a CAD Blood pressure at home is typically in the 120-130 range over 60-70   PMH:   has a past medical history of Enlarged heart; Heart disease; Heart murmur; and Hypertension.  PSH:    Past Surgical History:  Procedure Laterality Date  . HERNIA REPAIR    . KNEE SURGERY    . SKIN GRAFT      Current Outpatient Prescriptions  Medication Sig Dispense Refill  . amLODipine (NORVASC) 10 MG tablet Take 10 mg by mouth daily.  0  . Cholecalciferol (VITAMIN D) 2000 units tablet Take 2,000 Units by mouth daily.    [name] losartan (COZAAR) 50 MG tablet Take 50 mg by mouth daily.  0  . spironolactone (ALDACTONE) 25 MG tablet Take 25 mg by mouth daily.  6  . aspirin 81 MG tablet Take 81 mg by mouth daily.    . rosuvastatin (CRESTOR) 10 MG tablet Take 1 tablet (10 mg total) by mouth daily. 90 tablet 3   No current facility-administered medications for this visit.      Allergies:   Patient has no known allergies.   Social History:  The patient  reports that he has never smoked. He has never used smokeless tobacco. He reports that he drinks about 0.6 oz of alcohol per week . He reports  that he does not use drugs.   Family History:   family history includes Heart attack in his father; Hypertension in his father and mother.    Review of Systems: Review of Systems  Constitutional: Negative.   Respiratory: Negative.   Cardiovascular: Negative.   Gastrointestinal: Negative.   Musculoskeletal: Negative.   Neurological: Negative.   Psychiatric/Behavioral: Negative.   All other systems reviewed and are negative.    PHYSICAL EXAM: VS:  BP 134/80 (BP Location: Left Arm, Patient Position: Sitting, Cuff Size: Normal)   Pulse  64   Ht 5\' 11"  (1.803 m)   Wt 194 lb 8 oz (88.2 kg)   BMI 27.13 kg/m  , BMI Body mass index is 27.13 kg/m. GEN: Well nourished, well developed, in no acute distress  HEENT: normal  Neck: no JVD, carotid bruits, or masses Cardiac: RRR; no murmurs, rubs, or gallops,no edema  Respiratory:  clear to auscultation bilaterally, normal work of breathing GI: soft, nontender, nondistended, + BS MS: no deformity or atrophy  Skin: warm and dry, no rash Neuro:  Strength and sensation are intact Psych: euthymic mood, full affect    Recent Labs: 05/19/2016: ALT 23; BUN 23; Creatinine, Ser 1.30; Hemoglobin 18.0; Platelets 166; Potassium 4.1; Sodium 142    Lipid Panel No results found for: CHOL, HDL, LDLCALC, TRIG    Wt Readings from Last 3 Encounters:  12/21/16 194 lb 8 oz (88.2 kg)  05/19/16 190 lb (86.2 kg)  04/06/16 195 lb (88.5 kg)       ASSESSMENT AND PLAN:  Essential hypertension Blood pressure is well controlled on today's visit. No changes made to the medications.  Atherosclerosis of both carotid arteries We'll discuss with him in follow-up, will likely benefit from carotid ultrasound  Class 2 obesity due to excess calories without serious comorbidity with body mass index (BMI) of 35.0 to 35.9 in adult We have encouraged continued exercise, careful diet management in an effort to lose weight.  Coronary artery disease involving native coronary artery of native heart without angina pectoris Elevated CT coronary calcium score Images reviewed with him in detail today. Stressed importance of aggressive cholesterol control We have ordered a lipid panel  Mixed hyperlipidemia Repeat lipid panel ordered, goal LDL less than 70  Apical thickening Seen on echocardiogram, not seen on CT scan We will order cardiac MRI given dramatic EKG changes If he does have moderate to severe apical thickening as seen on echo, we'll discuss with EP No significant risk factors for sudden death,  reports father died from MI   Total encounter time more than 25 minutes  Greater than 50% was spent in counseling and coordination of care with the patient   Disposition:   F/U  6 months   Orders Placed This Encounter  Procedures  . EKG 12-Lead     Signed, Dossie Arbour, M.D., Ph.D. 12/21/2016  [facility] [place], [place] [phone]

## 2016-12-21 NOTE — Addendum Note (Signed)
Addended by: Dede Query R on: 12/21/2016 05:11 PM   Modules accepted: Orders

## 2016-12-22 ENCOUNTER — Other Ambulatory Visit: Payer: Self-pay | Admitting: Cardiovascular Disease

## 2016-12-23 LAB — LIPID PANEL W/O CHOL/HDL RATIO
Cholesterol, Total: 116 mg/dL (ref 100–199)
HDL: 51 mg/dL (ref >39)
LDL Calculated: 53 mg/dL (ref 0–99)
Triglycerides: 59 mg/dL (ref 0–149)
VLDL Cholesterol Cal: 12 mg/dL (ref 5–40)

## 2016-12-23 LAB — HEPATIC FUNCTION PANEL
ALBUMIN: 4.3 g/dL (ref 3.6–4.8)
ALK PHOS: 62 IU/L (ref 39–117)
ALT: 33 IU/L (ref 0–44)
AST: 30 IU/L (ref 0–40)
BILIRUBIN TOTAL: 0.5 mg/dL (ref 0.0–1.2)
Bilirubin, Direct: 0.17 mg/dL (ref 0.00–0.40)
Total Protein: 6.6 g/dL (ref 6.0–8.5)

## 2016-12-28 ENCOUNTER — Encounter: Payer: Self-pay | Admitting: Cardiovascular Disease

## 2016-12-28 ENCOUNTER — Telehealth: Payer: Self-pay | Admitting: Cardiovascular Disease

## 2016-12-28 NOTE — Telephone Encounter (Signed)
Called patient and gave him the date, time and location of his cardiac MRI.  Letter mailed to the patient with the same information.

## 2017-01-03 ENCOUNTER — Ambulatory Visit (HOSPITAL_COMMUNITY)
Admission: RE | Admit: 2017-01-03 | Discharge: 2017-01-03 | Disposition: A | Discharge status: Home / Self Care | Payer: No Typology Code available for payment source | Source: Ambulatory Visit | Attending: Cardiovascular Disease | Admitting: Cardiovascular Disease

## 2017-01-03 DIAGNOSIS — I6523 Occlusion and stenosis of bilateral carotid arteries: Secondary | ICD-10-CM | POA: Insufficient documentation

## 2017-01-03 DIAGNOSIS — E6609 Other obesity due to excess calories: Secondary | ICD-10-CM | POA: Insufficient documentation

## 2017-01-03 DIAGNOSIS — E782 Mixed hyperlipidemia: Secondary | ICD-10-CM | POA: Diagnosis not present

## 2017-01-03 DIAGNOSIS — I081 Rheumatic disorders of both mitral and tricuspid valves: Secondary | ICD-10-CM | POA: Insufficient documentation

## 2017-01-03 DIAGNOSIS — I10 Essential (primary) hypertension: Secondary | ICD-10-CM | POA: Insufficient documentation

## 2017-01-03 DIAGNOSIS — Z6835 Body mass index (BMI) 35.0-35.9, adult: Secondary | ICD-10-CM | POA: Diagnosis not present

## 2017-01-03 DIAGNOSIS — I422 Other hypertrophic cardiomyopathy: Secondary | ICD-10-CM | POA: Diagnosis not present

## 2017-01-03 DIAGNOSIS — I712 Thoracic aortic aneurysm, without rupture: Secondary | ICD-10-CM | POA: Insufficient documentation

## 2017-01-03 DIAGNOSIS — I2510 Atherosclerotic heart disease of native coronary artery without angina pectoris: Secondary | ICD-10-CM

## 2017-01-03 DIAGNOSIS — I517 Cardiomegaly: Secondary | ICD-10-CM | POA: Diagnosis not present

## 2017-01-03 LAB — POCT I-STAT CREATININE: CREATININE: 1.3 mg/dL — AB (ref 0.61–1.24)

## 2017-01-03 MED ORDER — GADOBENATE DIMEGLUMINE 529 MG/ML IV SOLN
30.0000 mL | Freq: Once | INTRAVENOUS | Status: AC | PRN
  Administered 2017-01-03: 30 mL via INTRAVENOUS

## 2017-01-18 ENCOUNTER — Ambulatory Visit (INDEPENDENT_AMBULATORY_CARE_PROVIDER_SITE_OTHER): Payer: No Typology Code available for payment source | Admitting: Internal Medicine

## 2017-01-18 ENCOUNTER — Encounter: Payer: Self-pay | Admitting: Internal Medicine

## 2017-01-18 VITALS — BP 134/80 | HR 62 | Ht 71.0 in | Wt 195.5 lb

## 2017-01-18 DIAGNOSIS — R002 Palpitations: Secondary | ICD-10-CM | POA: Diagnosis not present

## 2017-01-18 DIAGNOSIS — I422 Other hypertrophic cardiomyopathy: Secondary | ICD-10-CM | POA: Diagnosis not present

## 2017-01-18 HISTORY — DX: Other hypertrophic cardiomyopathy: I42.2

## 2017-01-18 NOTE — Progress Notes (Signed)
ELECTROPHYSIOLOGY CONSULT NOTE  Patient ID: Duane Warren, MRN: [id number], DOB/AGE: [birth year] 61 y.o. Admit date: (Not on file) Date of Consult: 01/18/2017  Primary Physician: Almedia Balls, MD Primary Cardiologist: Gracy Bruins is being seen today for the evaluation of Apical hypertrophy with possible hypertrophic cardiomyopathy at the request of Dr. Julien Nordmann     HPI Duane Warren is a 61 y.o. male has been told for years that he had a thick heart muscle. He has had no clearly treatable symptoms although he does note some changes in his exercise tolerance which he thinks is more than he should with his age. He has had some palpitations but has never been told he had atrial fibrillation.  He has had syncope. There is an event that occurred on an airplane that was relatively brief. He is also multiple episodes of presyncope that are also relatively brief, many of them occurring while seated. Lacerations are less than 1 minute.  There is no family history of hypertrophic cardiomyopathy no family history of premature death.  MRI 2023-01-15 demonstrated severe apical hypertrophy with a typical shave appearance. Gadolinium enhancement was noted. There was bilateral atrial enlargement.  He has a history of hypertension and carries a diagnosis of hyperaldosteronism   Past Medical History:  Diagnosis Date  . Enlarged heart   . Heart disease   . Heart murmur   . Hypertension       Surgical History:  Past Surgical History:  Procedure Laterality Date  . HERNIA REPAIR    . KNEE SURGERY    . SKIN GRAFT       Home Meds: Prior to Admission medications   Medication Sig Start Date End Date Taking? Authorizing Provider  amLODipine (NORVASC) 10 MG tablet Take 10 mg by mouth daily. 09/17/14  Yes Historical Provider, MD  aspirin 81 MG tablet Take 81 mg by mouth daily.   Yes Historical Provider, MD  Cholecalciferol (VITAMIN D) 2000 units tablet Take 2,000 Units by mouth  daily.   Yes Historical Provider, MD  colchicine 0.6 MG tablet Take 0.6 mg by mouth as needed. 12/29/16 12/29/17 Yes Historical Provider, MD  losartan (COZAAR) 50 MG tablet Take 50 mg by mouth daily. 09/17/14  Yes Historical Provider, MD  rosuvastatin (CRESTOR) 10 MG tablet Take 1 tablet (10 mg total) by mouth daily. 12/21/16 12/21/17 Yes Antonieta Iba, MD  spironolactone (ALDACTONE) 25 MG tablet Take 25 mg by mouth daily. 09/28/14  Yes Historical Provider, MD    Allergies: No Known Allergies  Social History   Social History  . Marital status: Married    Spouse name: N/A  . Number of children: N/A  . Years of education: N/A   Occupational History  . Not on file.   Social History Main Topics  . Smoking status: Never Smoker  . Smokeless tobacco: Never Used  . Alcohol use 0.6 oz/week    1 Glasses of wine per week  . Drug use: No  . Sexual activity: Yes    Birth control/ protection: None   Other Topics Concern  . Not on file   Social History Narrative   ** Merged History Encounter **         Family History  Problem Relation Age of Onset  . Hypertension Mother   . Heart attack Father   . Hypertension Father      ROS:  Please see the history of present illness.     All  other systems reviewed and negative.    Physical Exam: Blood pressure 134/80, pulse 62, height 5\' 11"  (1.803 m), weight 195 lb 8 oz (88.7 kg). General: Well developed, well nourished male in no acute distress. Head: Normocephalic, atraumatic, sclera non-icteric, no xanthomas, nares are without discharge. EENT: normal  Lymph Nodes:  none Neck: Negative for carotid bruits. JVD not elevated. Back:without scoliosis kyphosis Lungs: Clear bilaterally to auscultation without wheezes, rales, or rhonchi. Breathing is unlabored. Heart: RRR with S1 S2. No murmur . No rubs, or gallops appreciated. Abdomen: Soft, non-tender, non-distended with normoactive bowel sounds. No hepatomegaly. No rebound/guarding. No obvious  abdominal masses. Msk:  Strength and tone appear normal for age. Extremities: No clubbing or cyanosis. No edema.  Distal pedal pulses are 2+ and equal bilaterally. Skin: Warm and Dry Neuro: Alert and oriented X 3. CN III-XII intact Grossly normal sensory and motor function . Psych:  Responds to questions appropriately with a normal affect.      Labs: Cardiac Enzymes No results for input(s): CKTOTAL, CKMB, TROPONINI in the last 72 hours. CBC Lab Results  Component Value Date   WBC 10.1 05/19/2016   HGB 18.0 05/19/2016   HCT 52.3 (H) 05/19/2016   MCV 86.1 05/19/2016   PLT 166 05/19/2016   PROTIME: No results for input(s): LABPROT, INR in the last 72 hours. Chemistry No results for input(s): NA, K, CL, CO2, BUN, CREATININE, CALCIUM, PROT, BILITOT, ALKPHOS, ALT, AST, GLUCOSE in the last 168 hours.  Invalid input(s): LABALBU Lipids Lab Results  Component Value Date   CHOL 116 12/22/2016   HDL 51 12/22/2016   LDLCALC 53 12/22/2016   TRIG 59 12/22/2016   BNP No results found for: PROBNP Thyroid Function Tests: No results for input(s): TSH, T4TOTAL, T3FREE, THYROIDAB in the last 72 hours.  Invalid input(s): FREET3 Miscellaneous No results found for: DDIMER  Radiology/Studies:  Mr Card Morphology Wo/w Cm  Result Date: 01/04/2017 CLINICAL DATA:  61 year old male with h/o CAD and abnormal apical LV hypertrophy on an echocardiogram. EXAM: CARDIAC MRI TECHNIQUE: The patient was scanned on a 1.5 Tesla GE magnet. A dedicated cardiac coil was used. Functional imaging was done using Fiesta sequences. 2,3, and 4 chamber views were done to assess for RWMA's. Modified Simpson's rule using a short axis stack was used to calculate an ejection fraction on a dedicated work [employer]. The patient received 30 cc of Multihance. After 10 minutes inversion recovery sequences were used to assess for infiltration and scar tissue. CONTRAST:  30 cc  of Multihance FINDINGS: 1.  Moderately dilated left ventricle with normal systolic function (LVEF = 64%). There is severe hypertrophy of the apical segments measuring up to 22 mm in diastole. During systole there is a typical spade shape of the left ventricle. Basal portion of the left ventricle have only mild hypertrophy. There is a late gadolinium enhancement present in the apical inferior, lateral walls and in the true apex. LVEDD:  64 mm LVESD:  40 mm LVEDV:  163 ml LVESV:  59 ml SV:  104 ml Myocardial mass:  196 g 2. Normal right ventricular size, thickness and systolic function (RVEF = 59%) with no regional wall motion abnormalities. 3.  Moderate left and right atrial dilatation. 4.  Mild mitral and moderate tricuspid regurgitation. 5. Normal size of the aortic root measuring 38 mm. Mild aneurysmal dilatation of the ascending aorta measuring 42 mm. Mild dilatation of the pulmonary artery measuring 32 mm. 6.  Normal pericardium, no pericardial  effusion. IMPRESSION: 1. Moderately dilated left ventricle with normal systolic function (LVEF = 64%). There is severe hypertrophy of the apical segments measuring up to 22 mm in diastole. During systole there is a typical spade shape of the left ventricle with minimal blood trapping in the LV apex. Basal portion of the left ventricle have only mild hypertrophy. There is a late gadolinium enhancement present in the apical inferior, lateral walls and in the true apex. 2. Normal right ventricular size, thickness and systolic function (RVEF = 59%) with no regional wall motion abnormalities. 3. Moderate left and right atrial dilatation. 4.  Mild mitral and moderate tricuspid regurgitation. 5. Mild aneurysmal dilatation of the ascending aorta measuring 42 mm. Mild dilatation of the pulmonary artery measuring 32 mm. 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 Referral to EP is recommended. For the ascending aortic aneurysm an annual CTA or  MRA is recommended. Tobias Alexander Electronically Signed   By: Tobias Alexander   On: 01/04/2017 13:51    EKG:  Personally reviewed  sinus rhythm. Striking T-wave inversions V3-V6 consistent with apical HCM   Assessment and Plan:     hypertrophic cardiomyopathy-atypical variant    presyncope/syncope    hypertension  Palpitations  The patient has hypertrophic cardiomyopathy with the apical variant. The issues are complicated and includes 1- risk stratification for sudden death, 2- family assessment and genetic evaluation.  As relates to the first, his syncope is concerning. The episode was described as quite brief. He also has recurrent presyncope. These events are occurring relatively frequently and so in event recorder may help [place] clarify their mechanism. In the event that the event recorder is not illuminating, implantable Linq recorder would be appropriate.  This will also help [place] assess for the presence of nonsustained ventricular tachycardia  Blood pressure response to exercise needs to be assessed, although this risk factor is seen primarily younger patients.  Indeed, risk stratification algorithms and data from [facility] suggest that apical variant HCM particularly males over the age of 60-65 may not be at increased risk for sudden death; he has had symptoms however for some time.  His hypertension is a little bit of a confounder. However, his hypertrophy is quite asymmetric limited to the apex. Furthermore gadolinium enhancement is present as might be anticipated. He also has biatrial enlargement consistent with more diffuse diastolic dysfunction that we might see this with left ventricular hypertrophy secondary to hypertension  There is no known family history. We will plan to [facility].        Sherryl Manges

## 2017-01-18 NOTE — Patient Instructions (Signed)
Medication Instructions: - Your physician recommends that you continue on your current medications as directed. Please refer to the Current Medication list given to you today.  Labwork: - none ordered  Procedures/Testing: - Your physician has requested that you have an exercise tolerance test.- on a day Dr. Caryl Comes is in the office.   - Your physician has recommended that you wear a 30-day event monitor. Event monitors are medical devices that record the heart's electrical activity. Doctors most often [place] these monitors to diagnose arrhythmias. Arrhythmias are problems with the speed or rhythm of the heartbeat. The monitor is a small, portable device. You can wear one while you do your normal daily activities. This is usually used to diagnose what is causing palpitations/syncope (passing out).  Follow-Up: - You have been referred to : Dr. Lattie Corns- Genetics (you will get a call from our office to arrange this appointment for you).  - Pending results of your treadmill test.    Any Additional Special Instructions Will Be Listed Below (If Applicable).     If you need a refill on your cardiac medications before your next appointment, please call your pharmacy.

## 2017-01-23 ENCOUNTER — Ambulatory Visit (INDEPENDENT_AMBULATORY_CARE_PROVIDER_SITE_OTHER): Payer: No Typology Code available for payment source

## 2017-01-23 DIAGNOSIS — I422 Other hypertrophic cardiomyopathy: Secondary | ICD-10-CM | POA: Diagnosis not present

## 2017-01-24 LAB — EXERCISE TOLERANCE TEST
CHL CUP RESTING HR STRESS: 73 {beats}/min
CSEPEDS: 0 s
CSEPHR: 103 %
Estimated workload: 11.6 METS
Exercise duration (min): 10 min
MPHR: 158 {beats}/min
Peak HR: 164 {beats}/min

## 2017-01-29 ENCOUNTER — Ambulatory Visit (INDEPENDENT_AMBULATORY_CARE_PROVIDER_SITE_OTHER): Payer: No Typology Code available for payment source

## 2017-01-29 ENCOUNTER — Telehealth: Payer: Self-pay | Admitting: Internal Medicine

## 2017-01-29 DIAGNOSIS — R9431 Abnormal electrocardiogram [ECG] [EKG]: Secondary | ICD-10-CM | POA: Diagnosis not present

## 2017-01-29 DIAGNOSIS — R42 Dizziness and giddiness: Secondary | ICD-10-CM | POA: Diagnosis not present

## 2017-01-29 NOTE — Telephone Encounter (Signed)
Received notification from [employer] that they have not been able to hook up pt's 30 day monitor, as they have attempted to contact him w/ no success.   All #s provided were tried and they have sent text messages. This study will be cancelled and monitor recovery services will be notified to retrieve the device if they don't hear from the pt in the next 48 hours.   Spoke w/ pt.  He reports that he put the monitor on, but has not spoken w/ [employer]. Advised him that the monitor will not be activated until he calls them.  He will call them now at [phone]. Asked him to call back if we can be of further assistance.

## 2017-02-08 ENCOUNTER — Encounter: Payer: Self-pay | Admitting: Internal Medicine

## 2017-02-08 ENCOUNTER — Ambulatory Visit: Payer: No Typology Code available for payment source | Admitting: Genetic Counselor

## 2017-02-08 NOTE — Progress Notes (Addendum)
Patient presented today for pre-test genetic counseling of HCM. Labs were drawn for genetic testing. Detailed notes and pedigree will be attached soon.  Pre-test GC notes  Duane Warren was referred for genetic counseling of hypertrophic cardiomyopathy. He was counseled on the genetics of hypertrophic cardiomyopathy (HCM), namely inheritance, incomplete penetrance, variable expression and digenic/compound mutations that can be seen in some patients with HCM. We also briefly discussed the infiltrative cardiomyopathies, their inheritance pattern and treatment plans. We walked through the process of genetic testing. The potential outcomes of genetic testing and subsequent management of at-risk family members were discussed so as to manage expectations. His medical and 5-generation family history was obtained. See details below  [place] (III.1 on pedigree) is a pleasant 61 year old gentleman. At age 65, while at [place], he was suffering from recurrent headaches. He was found to have hypertension at that time. He states that his hypertension is under control by medications. His EKG showed an irregular rhythm and he and was also told by his physician that he had a "large heart". In early April 2018, he went to his physician in [place], [place] complaining of dizziness. He reports having several presyncopal episodes since 2012. He does mention having a syncopal episode in 2013 while flying. He reports that he recovered soon after. Upon further cardiology workup by Dr. Caryl Comes, he was found to present with apical hypertrophy.  Family history Duane (III.1) is the older sibling. Her sister (III.2) has spina bifida. Duane has two sons (IV.1, IV.2). Both his sons and their children (V.1-V.6) are currently in good health with no overt symptoms of heart disease. His sons have not yet had an echocardiogram.   [place] dad  (II.2) had several heart attacks throughout his life. He had cardiac  arrest while at home and passed away. Duane does report two other family members that died young and suddenly; his paternal aunt (II.1) and paternal grandmother (I.5) died at a young age. He is not aware of the cause of their death.   Other family members with heart disease include his paternal grandfather's brother (I.3) who had coronary artery disease and passed away from a heart attack. He has one maternal uncle (II.6) who had a stroke at age 34.   All other family members in the first two generations passed away from old age or other conditions.  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  Though he harbors a risk factor, namely hypertension, his disease presentation is unlike that of patients with uncontrolled hypertension. Such patients usually present with concentric hypertrophy. Additionally, he reports two family members from his paternal lineage that died suddenly at a young age, namely his paternal aunt (II.1), and paternal grandmother (I.5). Based on his family pedigree, it seems likely that he may have inherited the disease from his paternal lineage.   In light of his family history of sudden death (paternal grandmother and paternal aunt), nature of presentation (apical hypertrophy) and likely early age of presentation (age 51), genetic testing for HCM is recommended. This test includes genes that encode components of the sarcomeric contractile apparatus and HCM phenocopies. Genetic testing will confirm the diagnosis and identify the genetic basis of the disease. It will also help identify first-degree family members (siblings and children) that may harbor the mutation. As HCM is an autosomal dominant disorder, his sons are at a 50% risk of inheriting his condition. Genetic testing his children for the familial pathogenic variant will  help stratify risk for HCM in the family. Appropriate cardiology follow-up and lifestyle management can  then be directed to genotype-positive family members.

## 2017-03-19 DIAGNOSIS — R251 Tremor, unspecified: Secondary | ICD-10-CM | POA: Insufficient documentation

## 2017-04-12 ENCOUNTER — Ambulatory Visit: Payer: No Typology Code available for payment source | Admitting: Genetic Counselor

## 2017-04-19 ENCOUNTER — Telehealth: Payer: Self-pay | Admitting: Internal Medicine

## 2017-04-19 DIAGNOSIS — R42 Dizziness and giddiness: Secondary | ICD-10-CM

## 2017-04-19 DIAGNOSIS — R9431 Abnormal electrocardiogram [ECG] [EKG]: Secondary | ICD-10-CM

## 2017-04-19 NOTE — Telephone Encounter (Signed)
Looks like no appointment was put in for the patient initally- unable to be imported per Edgar, monitor tech.   Can y'all get him on the schedule and get his report imported- it's in [employer].   Thanks!

## 2017-04-19 NOTE — Telephone Encounter (Signed)
Pt calling asking about his monitor results  Please call back

## 2017-04-19 NOTE — Telephone Encounter (Signed)
I don't see that this has resulted yet.

## 2017-04-19 NOTE — Final Consult Note (Signed)
Post-test GC notes  Duane Warren is here today for a post-test GC consult. He was informed that he did not harbor a pathogenic variant in genes implicated in HCM. I explained to him that a negative test result indicates that while he does not have a genetic mutation in the major genes that cause HCM, it is likely that there is a genetic change in the regulatory region of the gene or in an as yet unidentified HCM gene. He understands the relevance of his test report.   Duane Warren expressed interest in enrolling in the Cardiomyopathy Research study at the [facility] lab in [employer]. I reviewed the criteria for enrolling, namely the patient had to be phenotype positive and genotype negative. We went over the details of the informed consent for the study. He signed off on the Informed Consent and Medical Release forms for the study. Blood was drawn today and will be sent out to the [facility] lab for the study.   Duane Warren has two sons. I recommended that he inform his family about his HCM test result and they update their primary care provider about his diagnosis so they can obtain an EKG and echocardiogram at appropriate intervals.  In addition, we discussed the protections afforded by the Genetic Information Non-Discrimination Act (GINA). I explained to him that GINA protects him from losing his employment or health insurance due to his genetic status for a disease. However, these protections do not cover life insurance and disability. He informed me that he was unable to obtain life insurance due to his EKG results. I recommended that he inform his adult kids to obtain appropriate life insurance coverage.

## 2017-04-20 NOTE — Telephone Encounter (Signed)
Monitor uploaded and routed to Dr. Caryl Comes

## 2017-04-26 ENCOUNTER — Encounter: Payer: Self-pay | Admitting: Internal Medicine

## 2017-04-26 NOTE — Progress Notes (Signed)
Spoke with pt and wife about monitoring using either ALIVECOR or LINQ to try and correlate his presyncopal/lightheaded spells and answering questions regarding this  They would like to proceed with LINQ and plan for next week

## 2017-04-27 ENCOUNTER — Other Ambulatory Visit: Payer: Self-pay | Admitting: Internal Medicine

## 2017-04-27 DIAGNOSIS — I472 Ventricular tachycardia: Secondary | ICD-10-CM

## 2017-04-27 DIAGNOSIS — I4729 Other ventricular tachycardia: Secondary | ICD-10-CM

## 2017-04-27 NOTE — Telephone Encounter (Signed)
Dr. Caryl Comes called and spoke with the patient last week about his monitor results.  He called and spoke with the patient and his wife yesterday about LINQ vs monitoring symptoms with the AliveCor monitor. Per the patient and his wife, they would like to proceed with LINQ implant on Monday 04/30/17 at [facility]  Linq scheduled for Monday 04/30/17 [facility] 8:30 am [facility] - light breakfast that morning - take normal medications - bring list of current medications and insurance cards to the hospital - wash chest and neck area with antibacterial soap night before and morning of Patient is aware of the date and time of procedure and above instructions Verbal instructions given to patient and he verbalizes understanding

## 2017-04-27 NOTE — Telephone Encounter (Signed)
-----   Message from Deboraha Sprang, MD sent at 04/20/2017  5:37 PM EDT ----- [facility] schedule patient yet H  Can we call him next week,  We talked about LINQ v ALIVECOR to try to sort out ongoing dizzy spells

## 2017-04-30 ENCOUNTER — Ambulatory Visit (HOSPITAL_COMMUNITY)
Admission: RE | Admit: 2017-04-30 | Discharge: 2017-04-30 | Disposition: A | Discharge status: Home / Self Care | Payer: No Typology Code available for payment source | Source: Ambulatory Visit | Attending: Internal Medicine | Admitting: Internal Medicine

## 2017-04-30 ENCOUNTER — Encounter (HOSPITAL_COMMUNITY)
Admission: RE | Disposition: A | Discharge status: Home / Self Care | Payer: Self-pay | Source: Ambulatory Visit | Attending: Internal Medicine

## 2017-04-30 DIAGNOSIS — I10 Essential (primary) hypertension: Secondary | ICD-10-CM | POA: Insufficient documentation

## 2017-04-30 DIAGNOSIS — Z8249 Family history of ischemic heart disease and other diseases of the circulatory system: Secondary | ICD-10-CM | POA: Insufficient documentation

## 2017-04-30 DIAGNOSIS — Z9889 Other specified postprocedural states: Secondary | ICD-10-CM | POA: Insufficient documentation

## 2017-04-30 DIAGNOSIS — I422 Other hypertrophic cardiomyopathy: Secondary | ICD-10-CM | POA: Insufficient documentation

## 2017-04-30 DIAGNOSIS — Z79899 Other long term (current) drug therapy: Secondary | ICD-10-CM | POA: Insufficient documentation

## 2017-04-30 DIAGNOSIS — I472 Ventricular tachycardia: Secondary | ICD-10-CM | POA: Diagnosis not present

## 2017-04-30 DIAGNOSIS — I4729 Other ventricular tachycardia: Secondary | ICD-10-CM

## 2017-04-30 DIAGNOSIS — R55 Syncope and collapse: Secondary | ICD-10-CM | POA: Diagnosis present

## 2017-04-30 HISTORY — PX: LOOP RECORDER INSERTION: EP1214

## 2017-04-30 SURGERY — LOOP RECORDER INSERTION

## 2017-04-30 MED ORDER — LIDOCAINE-EPINEPHRINE 1 %-1:100000 IJ SOLN
INTRAMUSCULAR | Status: AC
  Filled 2017-04-30: qty 1

## 2017-04-30 MED ORDER — LIDOCAINE HCL (PF) 1 % IJ SOLN
INTRAMUSCULAR | Status: DC | PRN
  Administered 2017-04-30: 10 mL

## 2017-04-30 SURGICAL SUPPLY — 2 items
LOOP REVEAL LINQSYS (Prosthesis & Implant Heart) ×3 IMPLANT
PACK LOOP INSERTION (CUSTOM PROCEDURE TRAY) ×3 IMPLANT

## 2017-04-30 NOTE — Discharge Instructions (Signed)
LOOP RECORDER INSTRUCTION SHEET GIVEN. °

## 2017-04-30 NOTE — H&P (Signed)
      Patient Care Team: Delilah Shan, MD as PCP - General (Family Medicine)   HPI  Duane Warren is a 61 y.o. male Admitted for loop recorder given hx of Apical HCM and syncope   Event recorder demonstrated asymptomatic VT NS and sinus bradycardia   Records and Results Reviewed   Past Medical History:  Diagnosis Date  . Apical variant hypertrophic cardiomyopathy ([place]) 01/18/2017  . Enlarged heart   . Heart disease   . Heart murmur   . Hypertension     Past Surgical History:  Procedure Laterality Date  . HERNIA REPAIR    . KNEE SURGERY    . SKIN GRAFT      No current facility-administered medications for this encounter.     No Known Allergies    Social History  Substance Use Topics  . Smoking status: Never Smoker  . Smokeless tobacco: Never Used  . Alcohol use 0.6 oz/week    1 Glasses of wine per week     Family History  Problem Relation Age of Onset  . Hypertension Mother   . Heart attack Father   . Hypertension Father      No current facility-administered medications on file prior to encounter.    Current Outpatient Prescriptions on File Prior to Encounter  Medication Sig Dispense Refill  . amLODipine (NORVASC) 10 MG tablet Take 10 mg by mouth daily.  0  . Cholecalciferol (VITAMIN D) 2000 units tablet Take 2,000 Units by mouth daily.    [name] losartan (COZAAR) 50 MG tablet Take 50 mg by mouth daily.  0  . rosuvastatin (CRESTOR) 10 MG tablet Take 1 tablet (10 mg total) by mouth daily. 90 tablet 3  . spironolactone (ALDACTONE) 25 MG tablet Take 25 mg by mouth daily.  6  . colchicine 0.6 MG tablet Take 0.6-1.2 mg by mouth as directed. Take 1.2 mg by mouth at first sign of gout flare, then if symptoms persist take 0.6 mg daily until symptoms subside        Review of Systems negative except from HPI and PMH  Physical Exam BP 135/77 (BP Location: Right Arm)   Pulse 63   Temp 97.9 F (36.6 C) (Oral)   Ht 5\' 11"  (1.803 m)   Wt 190 lb (86.2 kg)    SpO2 97%   BMI 26.50 kg/m  Well developed and well nourished in no acute distress HENT normal E scleral and icterus clear Neck Supple JVP flat; carotids brisk and full Clear to ausculation  Regular rate and rhythm, no murmurs gallops or rub Soft with active bowel sounds No clubbing cyanosis  Edema Alert and oriented, grossly normal motor and sensory function Skin Warm and Dry    Assessment and  Plan  Syncope Apical HCM Ventricular tachycardia   The ability to risk stratify in the elderly ( >60) is challenging, and there is not a clear association between the observed arrhythmias and his syncope   Hence loop recorder is indicated  \

## 2017-05-01 ENCOUNTER — Encounter (HOSPITAL_COMMUNITY): Payer: Self-pay | Admitting: Internal Medicine

## 2017-05-11 ENCOUNTER — Ambulatory Visit (INDEPENDENT_AMBULATORY_CARE_PROVIDER_SITE_OTHER): Payer: Self-pay | Admitting: *Deleted

## 2017-05-11 DIAGNOSIS — R42 Dizziness and giddiness: Secondary | ICD-10-CM

## 2017-05-11 DIAGNOSIS — I422 Other hypertrophic cardiomyopathy: Secondary | ICD-10-CM

## 2017-05-11 LAB — CUP PACEART INCLINIC DEVICE CHECK
Implantable Pulse Generator Implant Date: 20180716
MDC IDC SESS DTM: 20180727111050

## 2017-05-11 NOTE — Progress Notes (Signed)
Wound check appointment. Steri-strips removed. Wound without redness or edema. Incision edges approximated, wound well healed. Normal device function. Battery status: good. R-waves 2.80mV. No tachy, pause, brady, or AT/AF episodes. 2 symptom episodes--1 episode from post-implant education and 1 symptom episode from 7/26, both ECGs show SR, patient reports dizziness with episode yesterday. Reprogrammed device indication from "suspected AF" to "ventricular tachycardia" and ectopy rejection to aggressive. Patient educated about home monitor and wound care. Patient and wife instructed to call our office for any symptom episodes so that we are aware of his symptoms and can review ECGs. Monthly summary reports and ROV with SK on 08/03/17.

## 2017-05-16 ENCOUNTER — Encounter: Payer: Self-pay | Admitting: Neurology

## 2017-05-23 DIAGNOSIS — F4323 Adjustment disorder with mixed anxiety and depressed mood: Secondary | ICD-10-CM | POA: Insufficient documentation

## 2017-05-23 DIAGNOSIS — F419 Anxiety disorder, unspecified: Secondary | ICD-10-CM | POA: Insufficient documentation

## 2017-05-30 ENCOUNTER — Ambulatory Visit (INDEPENDENT_AMBULATORY_CARE_PROVIDER_SITE_OTHER): Payer: No Typology Code available for payment source | Admitting: *Deleted

## 2017-05-30 DIAGNOSIS — R002 Palpitations: Secondary | ICD-10-CM | POA: Diagnosis not present

## 2017-06-07 LAB — CUP PACEART REMOTE DEVICE CHECK
Date Time Interrogation Session: 20180815134302
MDC IDC PG IMPLANT DT: 20180716

## 2017-06-07 NOTE — Progress Notes (Signed)
Loop recorder summary report 

## 2017-06-08 NOTE — Progress Notes (Signed)
Duane Warren was seen today in the movement disorders clinic for neurologic consultation at the request of Delilah Shan, MD.  The consultation is for the evaluation of tremor.  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  Tremor: Yes.     How long has it been going on? More than 10+ years (cannot further quantify)  At rest or with activation?  Mostly with activation.    When is it noted the most?  Picking up something, but tremor is very intermittent.    Fam hx of tremor?  No.  Located where?  L hand is worse than the right but both have tremor  Affected by caffeine:  Doesn't drink enough to know  Affected by alcohol:  No. (couple glasses wine per week)  Affected by stress:  Yes.    Affected by fatigue:  No.  Spills soup if on spoon:  No., but he may feel the tremor.  He is R handed.  Affects ADL's (tying shoes, brushing teeth, etc):  No.  Specific Symptoms:  Voice: no change Sleep: trouble staying asleep due to nocturia  Vivid Dreams:  No.  Acting out dreams:  No. Wet Pillows: No. Postural symptoms:  No.  Falls?  No. Bradykinesia symptoms: no bradykinesia noted Loss of smell:  No. Loss of taste:  No. Urinary Incontinence:  No. Difficulty Swallowing:  No. Handwriting, micrographia: No. Memory changes:  No. Hallucinations:  No.  visual distortions: No. N/V:  No. Lightheaded:  No.  Syncope: No. but had near syncopal episodes and now has LOOP recorder.  Pt thinks that the spells were stress related and "that is why I just started the anxiety medication."  (referring to lexapro) Diplopia:  No. Dyskinesia:  No.  Neuroimaging has previously been performed.  MRI of the brain was done in August, 2017.  I reviewed those images.  They were unremarkable.  PREVIOUS MEDICATIONS: none to date  ALLERGIES:  No Known  Allergies  CURRENT MEDICATIONS:  Outpatient Encounter Prescriptions as of 06/11/2017  Medication Sig  . albuterol (PROVENTIL HFA;VENTOLIN HFA) 108 (90 Base) MCG/ACT inhaler Inhale 2 puffs into the lungs every 6 (six) hours as needed for wheezing or shortness of breath.  [name] amLODipine (NORVASC) 10 MG tablet Take 10 mg by mouth daily.  . Cholecalciferol (VITAMIN D) 2000 units tablet Take 2,000 Units by mouth daily.  . colchicine 0.6 MG tablet Take 0.6-1.2 mg by mouth as directed. Take 1.2 mg by mouth at first sign of gout flare, then if symptoms persist take 0.6 mg daily until symptoms subside  . losartan (COZAAR) 50 MG tablet Take 50 mg by mouth daily.  [name] MAGNESIUM-POTASSIUM PO Take 1 tablet by mouth 4 (four) times a week.  . OLOPATADINE HCL NA Place 2 sprays into the nose daily as needed (congestion).  . Omega-3 Fatty Acids (OMEGA 3 PO) Take 1,600 mg by mouth 4 (four) times a week.  . rosuvastatin (CRESTOR) 10 MG tablet Take 1 tablet (10 mg total) by mouth daily.  [name] spironolactone (ALDACTONE) 25 MG tablet Take 25 mg by mouth daily.  . vitamin C (ASCORBIC ACID) 500 MG tablet Take 500 mg by mouth 4 (four) times a week.  . [DISCONTINUED] ibuprofen (ADVIL,MOTRIN) 200 MG tablet Take 200-400 mg by mouth daily as needed for headache or moderate pain.  [name] escitalopram (LEXAPRO) 10  MG tablet Take 10 mg by mouth daily.  . [DISCONTINUED] Melatonin 10 MG TABS Take 10 mg by mouth at bedtime as needed (sleep).   No facility-administered encounter medications on file as of 06/11/2017.     PAST MEDICAL HISTORY:   Past Medical History:  Diagnosis Date  . Anxiety   . Apical variant hypertrophic cardiomyopathy ([place]) 01/18/2017  . Enlarged heart   . Heart disease   . Heart murmur   . Hypertension     PAST SURGICAL HISTORY:   Past Surgical History:  Procedure Laterality Date  . HERNIA REPAIR    . KNEE SURGERY    . LOOP RECORDER INSERTION N/A 04/30/2017   Procedure: Loop Recorder Insertion;  Surgeon:  Deboraha Sprang, MD;  Location: [facility];  Service: Cardiovascular;  Laterality: N/A;  . SKIN GRAFT      SOCIAL HISTORY:   Social History   Social History  . Marital status: Married    Spouse name: N/A  . Number of children: N/A  . Years of education: N/A   Occupational History  . Not on file.   Social History Main Topics  . Smoking status: Never Smoker  . Smokeless tobacco: Never Used  . Alcohol use 0.6 oz/week    1 Glasses of wine per week  . Drug use: No  . Sexual activity: Yes    Birth control/ protection: None   Other Topics Concern  . Not on file   Social History Narrative   ** Merged History Encounter **        FAMILY HISTORY:   Family Status  Relation Status  . Mother Alive  . Father Deceased    ROS:  Some neck pain.  Had MRI in 2012 demonstrating borderline CCS at C3-6.  I attempted to pull up these images, but they were not available for viewing.  A complete 10 system review of systems was obtained and was unremarkable apart from what is mentioned above.  PHYSICAL EXAMINATION:    VITALS:   Vitals:   06/11/17 1404  BP: 112/60  Pulse: 64  SpO2: 94%  Weight: 188 lb (85.3 kg)  Height: 5\' 11"  (1.803 m)    GEN:  The patient appears stated age and is in NAD. HEENT:  Normocephalic, atraumatic.  The mucous membranes are moist. The superficial temporal arteries are without ropiness or tenderness. CV:  RRR Lungs:  CTAB Neck/HEME:  There are no carotid bruits bilaterally. Skin: Has fluctuant mass in the posterior cervical region (likely lipoma)  Neurological examination:  Orientation: The patient is alert and oriented x3. Fund of knowledge is appropriate.  Recent and remote memory are intact.  Attention and concentration are normal.    Able to name objects and repeat phrases. Cranial nerves: There is good facial symmetry. Pupils are equal round and reactive to light bilaterally. Fundoscopic exam reveals clear margins bilaterally. Extraocular  muscles are intact. The visual fields are full to confrontational testing. The speech is fluent and clear. Soft palate rises symmetrically and there is no tongue deviation. Hearing is intact to conversational tone. Sensation: Sensation is intact to light and pinprick throughout (facial, trunk, extremities). Vibration is intact at the bilateral big toe. There is no extinction with double simultaneous stimulation. There is no sensory dermatomal level identified. Motor: Strength is 5/5 in the bilateral upper and lower extremities.   Shoulder shrug is equal and symmetric.  There is no pronator drift. Deep tendon reflexes: Deep tendon reflexes are 3+/4 at the  bilateral biceps, triceps, brachioradialis, patella and achilles. Plantar responses are downgoing bilaterally.  Movement examination: Tone: There is normal tone in the bilateral upper extremities.  The tone in the lower extremities is normal.  Abnormal movements: There is mild tremor of the outstretched hands, left greater than right.  This just increases slightly when holding a weight, but is still mild.  He has little trouble with Archimedes spirals bilaterally.  He has very little difficulty pouring water from one glass to another. Coordination:  There is no decremation with RAM's, with any form of RAMS, including alternating supination and pronation of the forearm, hand opening and closing, finger taps, heel taps and toe taps. Gait and Station: The patient has no difficulty arising out of a deep-seated chair without the use of the hands. The patient's stride length is normal with good arm swing.    LABS:    Chemistry      Component Value Date/Time   NA 142 05/19/2016 1134   K 4.1 05/19/2016 1134   CL 108 05/19/2016 1134   CO2 26 05/19/2016 1134   BUN 23 (H) 05/19/2016 1134   CREATININE 1.30 (H) 01/03/2017 1441      Component Value Date/Time   CALCIUM 9.5 05/19/2016 1134   ALKPHOS 62 12/22/2016 1112   AST 30 12/22/2016 1112   ALT 33  12/22/2016 1112   BILITOT 0.5 12/22/2016 1112     Lab Results  Component Value Date   TSH 1.854 09/12/2011      ASSESSMENT/PLAN:  1.  Tremor  -fairly minor on examination.  We discussed that this likely represents essential tremor.  I saw no evidence of a neurodegenerative process such as Parkinson's.  We discussed nature and pathophysiology.  We discussed that this can continue to gradually get worse with time.  We discussed that some medications can worsen this, as can caffeine use.  We discussed medication therapy as well as surgical therapy.  Ultimately, the patient decided to hold off on medication and take a wait and see approach.    Will get labs including TSH from PCP.  He does follow regularly with endocrinology (review those notes) for hyperaldosteronism and will let his endocrinologist now about tremor, but he has had this for quite some time.  2.  Neck pain  -Patient is quite hyperreflexic diffusely.  He had an MRI in 2012 that would not explain this.  We will repeat this to make sure that we are not missing anything.  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  This should not contribute any neck pain or tremor.  4.  Anxiety  -Patient reports that he was just started on Lexapro.  He thinks has been beneficial.  He asked me several questions about potential effects of Lexapro on the brain and how it works.  I answered these to the best of my ability.  5.  At this point, I will plan on seeing the patient back as needed, but will certainly let him know the results of his MRI of the cervical spine and follow-up with that as appropriate.  Much greater than 50% of this visit was spent in counseling and coordinating care.  Total face to face time:  60 min     Cc:  Delilah Shan, MD

## 2017-06-11 ENCOUNTER — Encounter: Payer: Self-pay | Admitting: Neurology

## 2017-06-11 ENCOUNTER — Ambulatory Visit (INDEPENDENT_AMBULATORY_CARE_PROVIDER_SITE_OTHER): Payer: No Typology Code available for payment source | Admitting: Neurology

## 2017-06-11 VITALS — BP 112/60 | HR 64 | Ht 71.0 in | Wt 188.0 lb

## 2017-06-11 DIAGNOSIS — D171 Benign lipomatous neoplasm of skin and subcutaneous tissue of trunk: Secondary | ICD-10-CM | POA: Diagnosis not present

## 2017-06-11 DIAGNOSIS — G250 Essential tremor: Secondary | ICD-10-CM

## 2017-06-11 DIAGNOSIS — M542 Cervicalgia: Secondary | ICD-10-CM

## 2017-06-11 DIAGNOSIS — F419 Anxiety disorder, unspecified: Secondary | ICD-10-CM

## 2017-06-29 ENCOUNTER — Ambulatory Visit (INDEPENDENT_AMBULATORY_CARE_PROVIDER_SITE_OTHER): Payer: No Typology Code available for payment source | Admitting: *Deleted

## 2017-06-29 DIAGNOSIS — R002 Palpitations: Secondary | ICD-10-CM | POA: Diagnosis not present

## 2017-06-29 NOTE — Progress Notes (Signed)
Carelink Summary Report / Loop Recorder 

## 2017-07-02 ENCOUNTER — Telehealth: Payer: Self-pay | Admitting: Neurology

## 2017-07-02 ENCOUNTER — Ambulatory Visit
Admission: RE | Admit: 2017-07-02 | Discharge: 2017-07-02 | Disposition: A | Discharge status: Home / Self Care | Payer: No Typology Code available for payment source | Source: Ambulatory Visit | Attending: Neurology | Admitting: Neurology

## 2017-07-02 DIAGNOSIS — M542 Cervicalgia: Secondary | ICD-10-CM

## 2017-07-02 DIAGNOSIS — M5090 Cervical disc disorder, unspecified, unspecified cervical region: Secondary | ICD-10-CM

## 2017-07-02 NOTE — Telephone Encounter (Signed)
Patient made aware and is agreeable to neurosurgery referral. Referral faxed to [facility] at [phone] with confirmation received. They will contact the patient to schedule.

## 2017-07-02 NOTE — Telephone Encounter (Signed)
Left message on machine for patient to call back.

## 2017-07-02 NOTE — Telephone Encounter (Signed)
-----   Message from [place], DO sent at 07/02/2017 11:51 AM EDT ----- Let pt know that I have reviewed MRI cervical spine.  At c6-7, there is a disc protrusion that contacts [place] and I think that there are some [place] signal changes.  Let pt know that I would like him to get neurosx cx with Dr. Vertell Limber or Trenton Gammon

## 2017-07-02 NOTE — Telephone Encounter (Signed)
Patient returned your call.  Thanks!

## 2017-07-03 ENCOUNTER — Telehealth: Payer: Self-pay | Admitting: Neurology

## 2017-07-03 LAB — CUP PACEART REMOTE DEVICE CHECK
Implantable Pulse Generator Implant Date: 20180716
MDC IDC SESS DTM: 20180914140752

## 2017-07-03 NOTE — Telephone Encounter (Signed)
Went over MR results with patient again.

## 2017-07-03 NOTE — Telephone Encounter (Signed)
[phone]. He would like to speak with you regarding his MRI. Please Call. Thanks

## 2017-07-05 IMAGING — MR MR MRA NECK WO/W CM
3 series · 41 of 48 positions shown · IV contrast (multihance)
Comparison: Head CT from earlier today

CLINICAL DATA: Dizziness and off balance.  Symptoms since [REDACTED].

EXAM:
MR HEAD WITHOUT CONTRAST
MR CIRCLE OF WILLIS WITHOUT CONTRAST
MRA OF THE NECK WITHOUT AND WITH CONTRAST
TECHNIQUE: Multiplanar, multiecho pulse sequences of the brain and surrounding
structures were obtained according to standard protocol without and
with intravenous contrast; Angiographic images of the Circle of
Willis were obtained using MRA technique without intravenous
contrast.; Multiplanar and multiecho pulse sequences of the neck
were obtained without and with intravenous contrast. Angiographic
images of the neck were obtained using MRA technique without and
with intravenous contast.
CONTRAST:  18mL MULTIHANCE GADOBENATE DIMEGLUMINE 529 MG/ML IV SOLN

[Series 3: (id) · axial · 1.0mm · 0.52mm/px · z∈[-64,+31]mm · 16 of 96 slices shown]
[im 1/96]
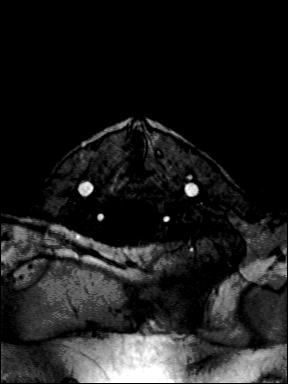
[im 7/96]
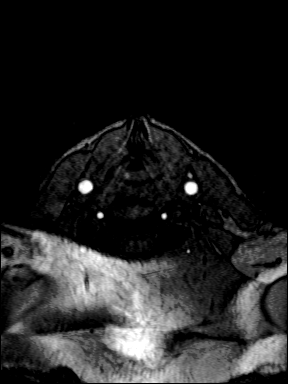
[im 13/96]
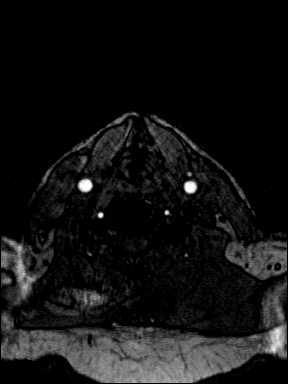
[im 20/96]
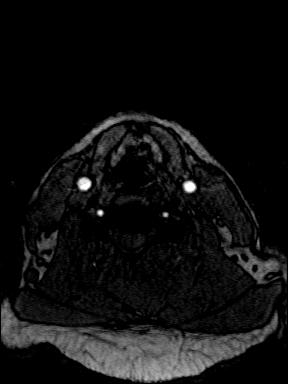
[im 26/96]
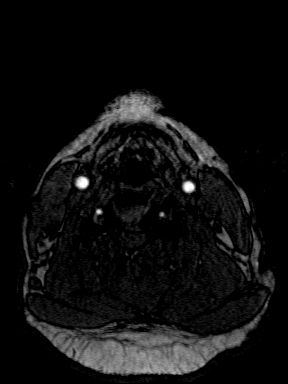
[im 32/96]
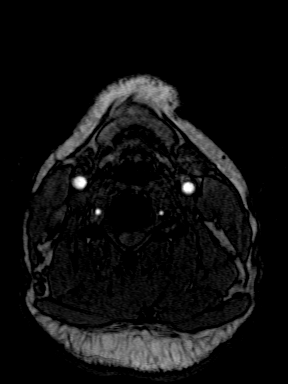
[im 39/96]
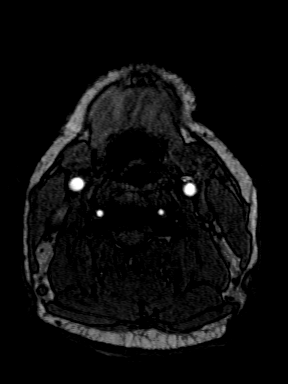
[im 45/96]
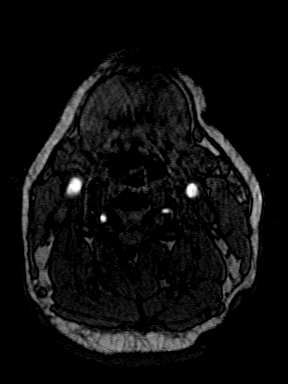
[im 51/96]
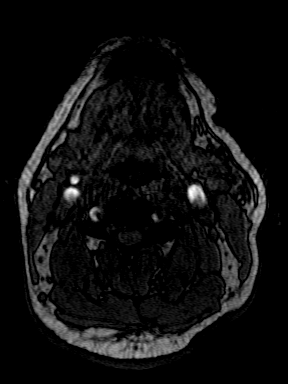
[im 58/96]
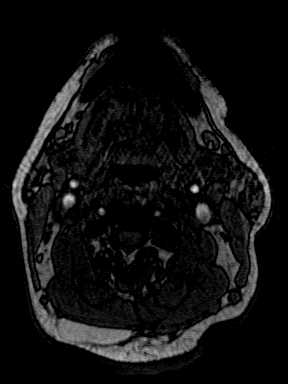
[im 64/96]
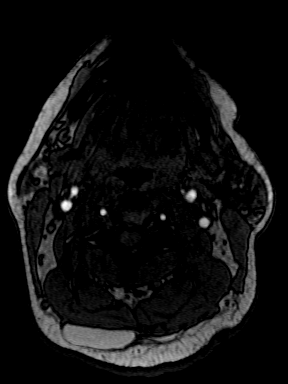
[im 70/96]
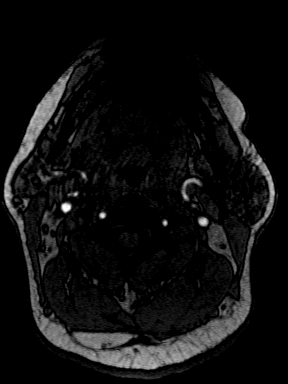
[im 77/96]
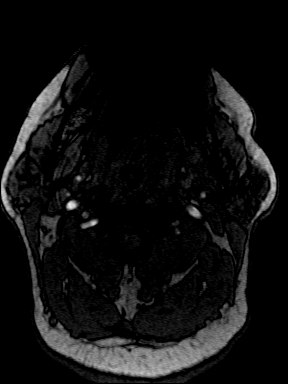
[im 83/96]
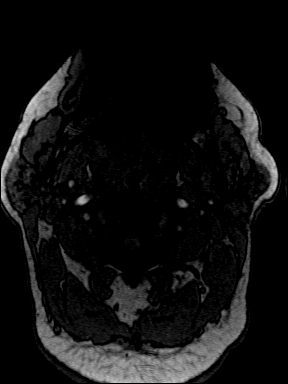
[im 89/96]
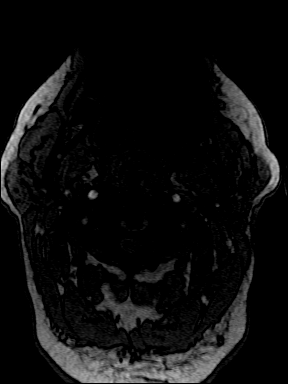
[im 96/96]
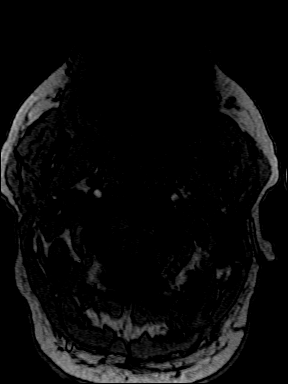

[Series 9: MRA · coronal · 0.7mm · 0.84mm/px · 15 of 103 slices shown (1 of 2)]
[im 1/103]
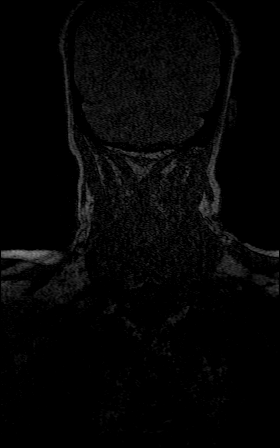
[im 7/103]
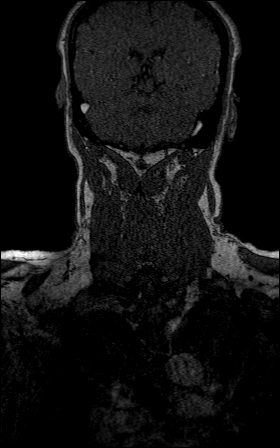
[im 14/103]
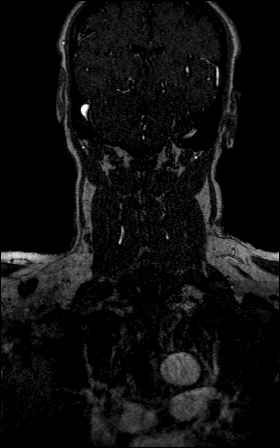
[im 21/103]
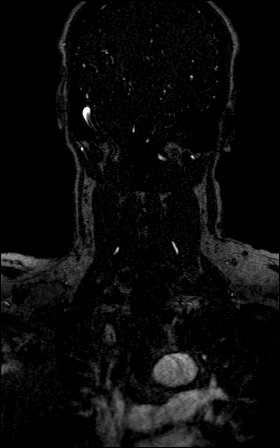
[im 28/103]
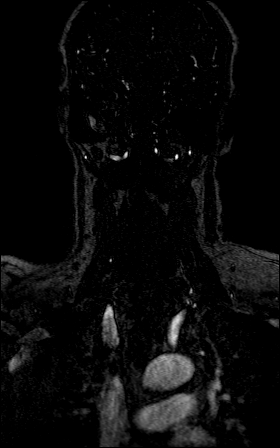
[im 35/103]
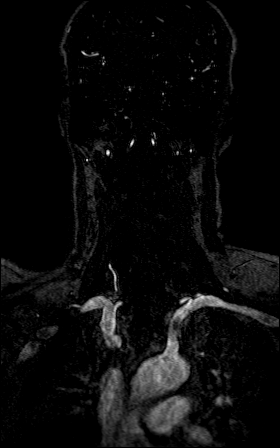
[im 41/103]
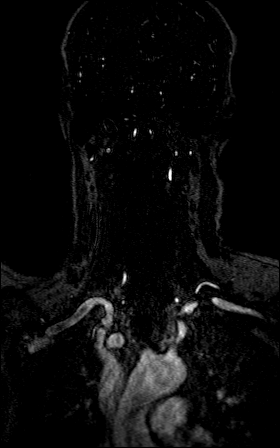
[im 48/103]
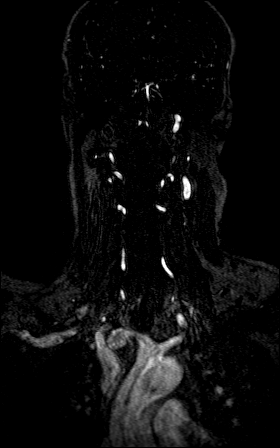
[im 55/103]
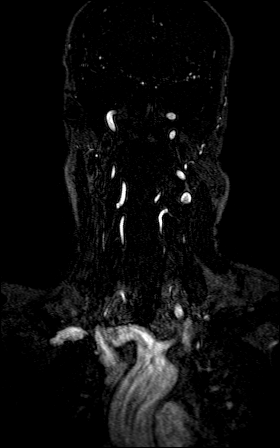
[im 62/103]
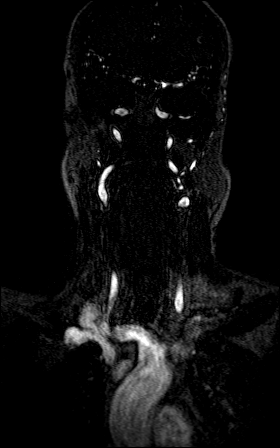
[im 69/103]
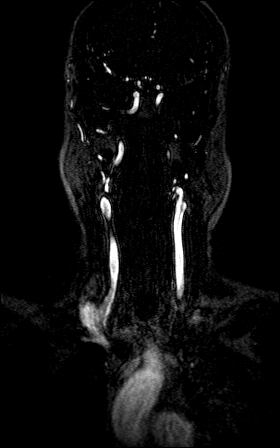
[im 75/103]
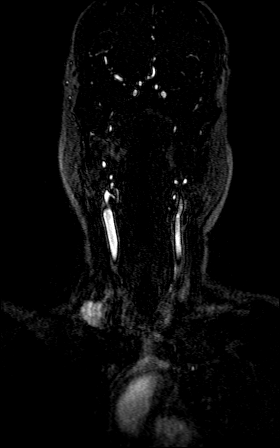
[im 82/103]
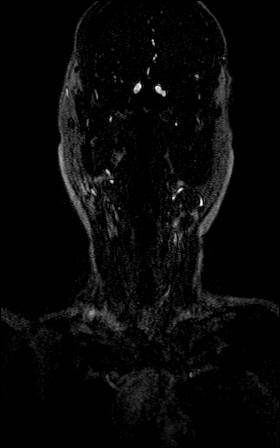
[im 89/103]
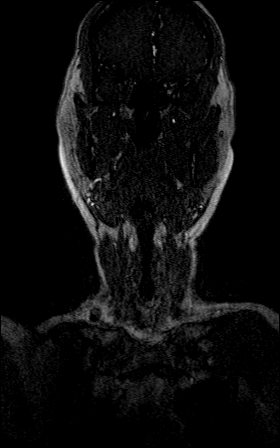
[im 103/103]
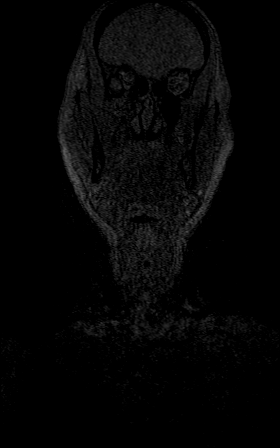

[Series 10: MRA · coronal · 0.7mm · 0.84mm/px · 10 of 102 slices shown (2 of 2)]
[im 7/102]
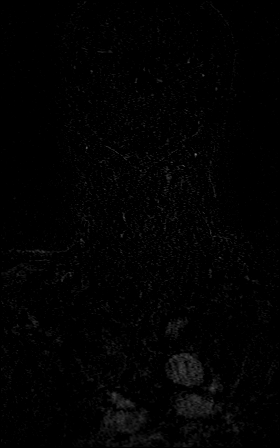
[im 14/102]
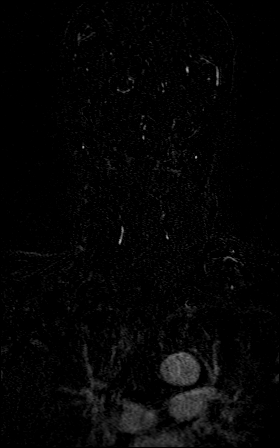
[im 21/102]
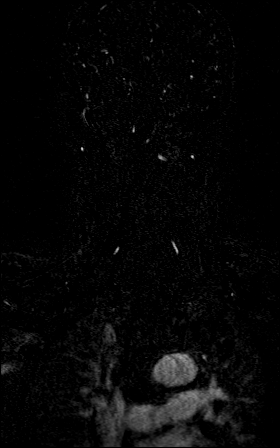
[im 34/102]
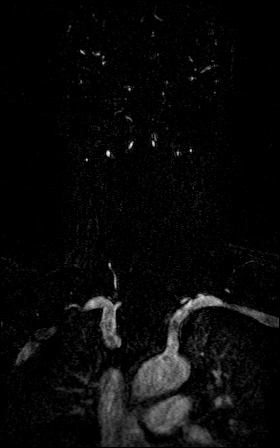
[im 48/102]
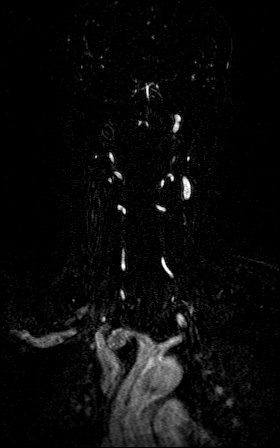
[im 54/102]
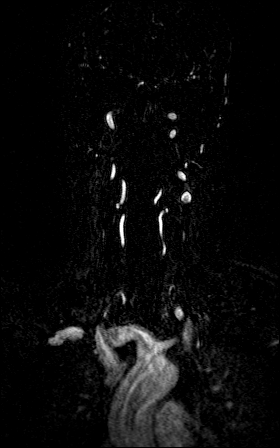
[im 61/102]
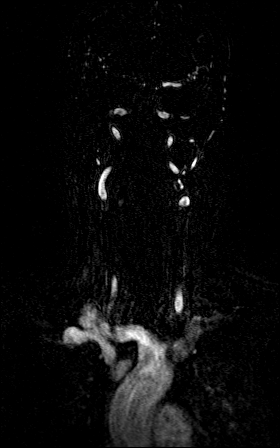
[im 75/102]
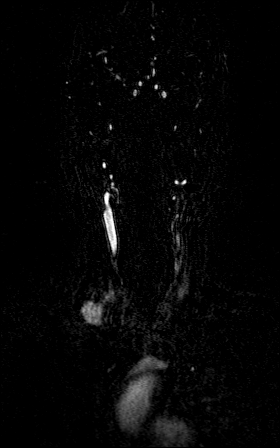
[im 88/102]
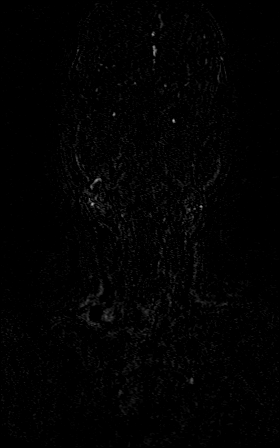
[im 102/102]
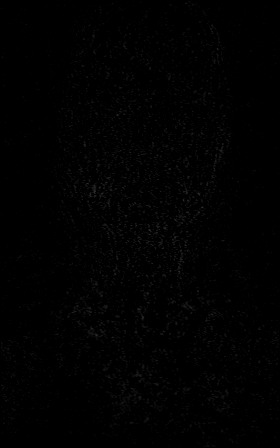

[41 of 48 positions shown; findings below may reference images not displayed]

FINDINGS: MR HEAD FINDINGS

Calvarium and upper cervical spine: No focal marrow signal
abnormality.

Orbits: Gaze to the left.

Sinuses and Mastoids: Mucous retention cysts in the maxillary antra.
Mild mucosal thickening throughout right mastoid air cells.

Brain: No acute infarct, hemorrhage, hydrocephalus, or mass lesion.
No evidence of large vessel occlusion. Remote small vessel infarct
in the left cerebellum. No noted ischemic changes in cerebral white
matter. No atrophy. Symmetric signal in the temporal bones and
internal auditory canals.

MR CIRCLE OF WILLIS FINDINGS

Symmetric vertebral arteries.  Standard vertebrobasilar branching.

Symmetric carotid arteries and carotid branching. Sizable posterior
communicating arteries. Present anterior communicating artery.

No major branch occlusion or flow limiting stenosis. No evidence of
dural fistula. Negative for aneurysm.

MRA NECK FINDINGS

Time-of-flight imaging shows antegrade flow in the major cervical
arteries.

Negative aortic arch. Two vessel branching pattern. Both carotids
are smooth and widely patent. Both vertebrals are smooth and widely
patent.
IMPRESSION: 1. No acute finding, including infarct. Negative cervical and
intracranial MRA.
2. Mild mucosal thickening in right mastoid air cells.

## 2017-07-05 IMAGING — MR MR HEAD WO/W CM
11 series · 42 of 48 positions shown · IV contrast (multihance)
Comparison: Head CT from earlier today

CLINICAL DATA: Dizziness and off balance.  Symptoms since [REDACTED].

EXAM:
MR HEAD WITHOUT CONTRAST
MR CIRCLE OF WILLIS WITHOUT CONTRAST
MRA OF THE NECK WITHOUT AND WITH CONTRAST
TECHNIQUE: Multiplanar, multiecho pulse sequences of the brain and surrounding
structures were obtained according to standard protocol without and
with intravenous contrast; Angiographic images of the Circle of
Willis were obtained using MRA technique without intravenous
contrast.; Multiplanar and multiecho pulse sequences of the neck
were obtained without and with intravenous contrast. Angiographic
images of the neck were obtained using MRA technique without and
with intravenous contast.
CONTRAST:  18mL MULTIHANCE GADOBENATE DIMEGLUMINE 529 MG/ML IV SOLN

[Series 2: T1 · sagittal · 5.0mm · 0.45mm/px · 3 of 25 slices shown (1 of 2)]
[im 1/25]
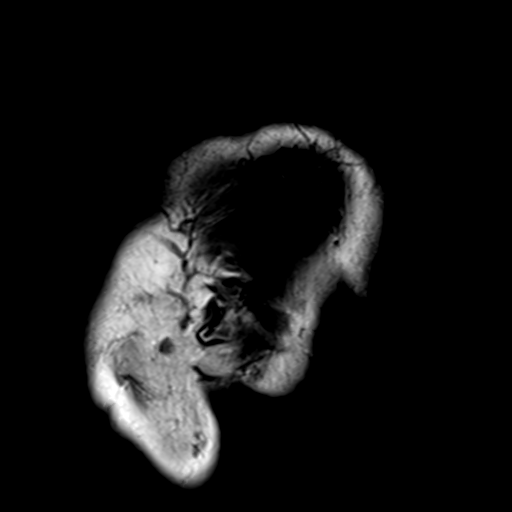
[im 13/25]
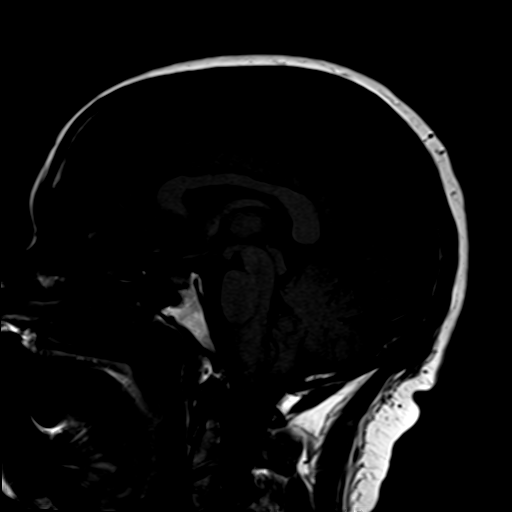
[im 25/25]
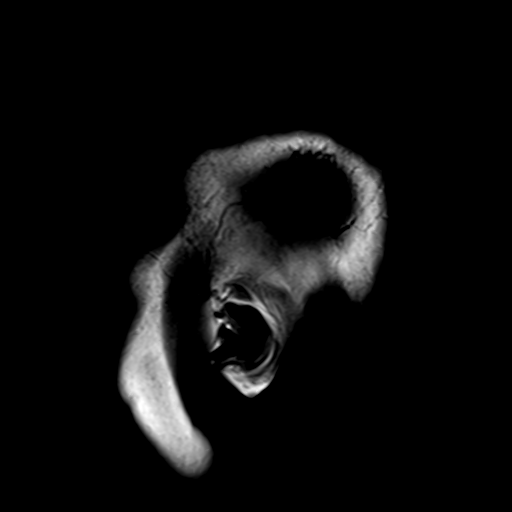

[Series 3: TOF · axial · non-contrast · 0.7mm · 0.37mm/px · z∈[-69,+30]mm · 8 of 143 slices shown]
[im 1/143]
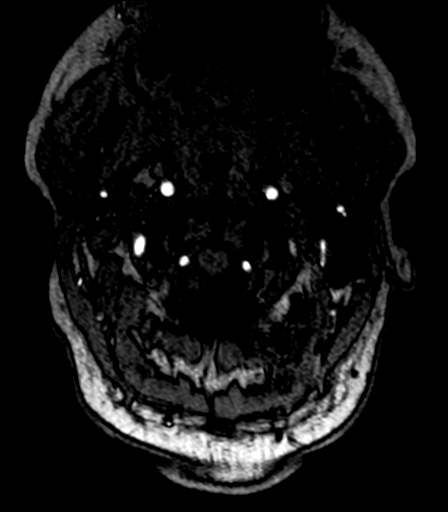
[im 22/143]
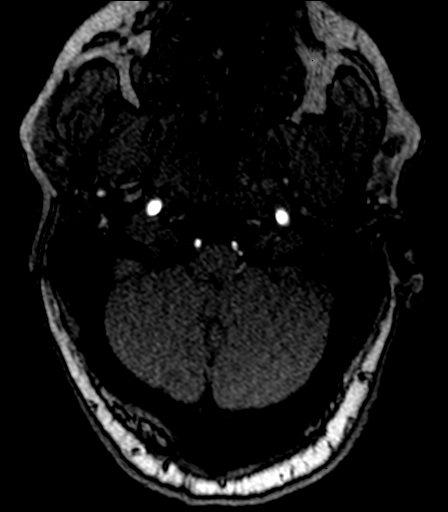
[im 44/143]
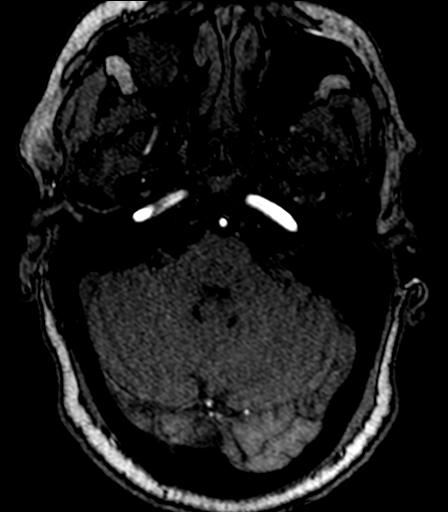
[im 66/143]
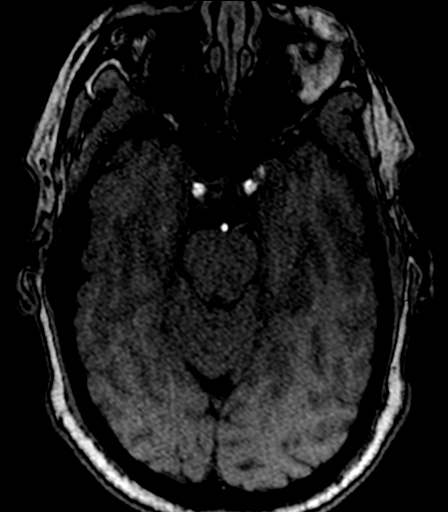
[im 77/143]
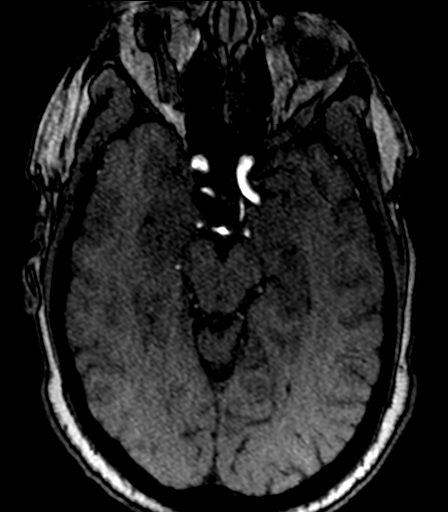
[im 99/143]
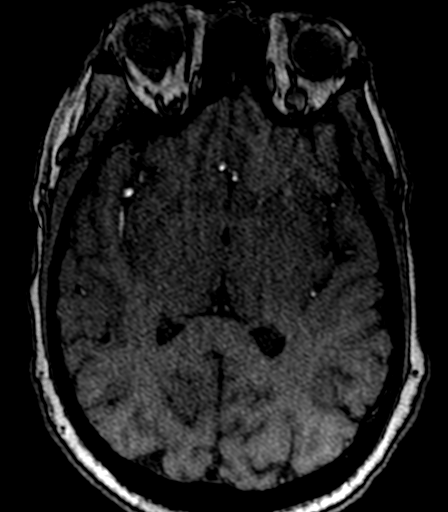
[im 121/143]
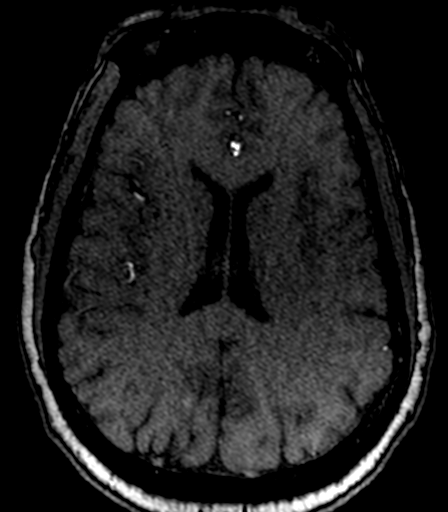
[im 143/143]
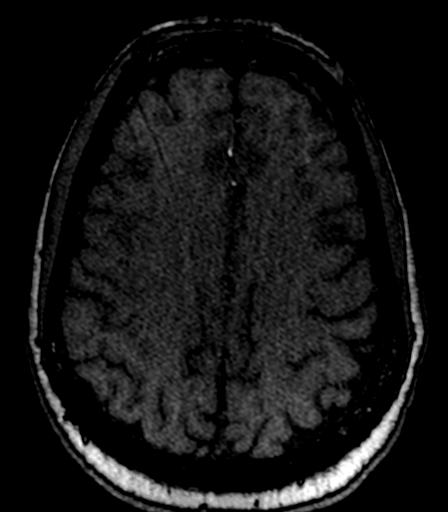

[Series 12: T2 · axial · 5.0mm · 0.60mm/px · z∈[-74,+75]mm · 2 of 24 slices shown (1 of 3)]
[im 1/24]
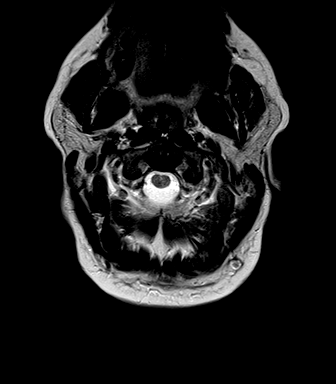
[im 24/24]
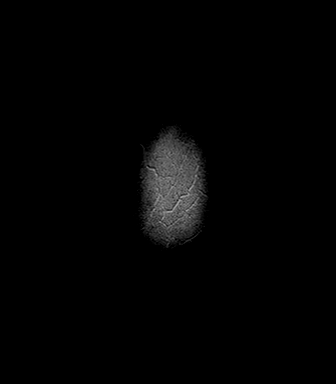

[Series 13: FLAIR · axial · 5.0mm · 0.45mm/px · z∈[-74,+75]mm · 2 of 24 slices shown]
[im 1/24]
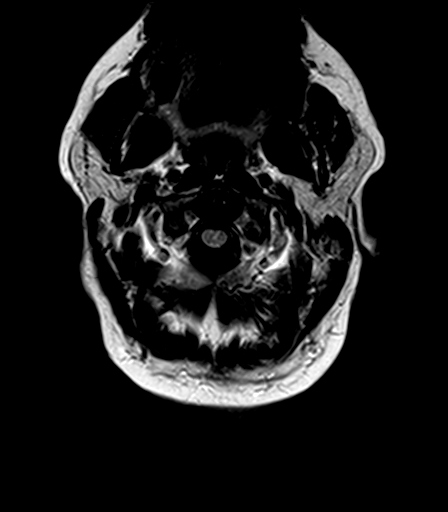
[im 24/24]
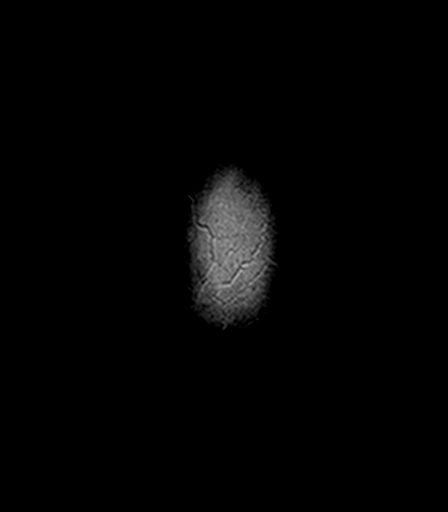

[Series 14: T2 · axial · 5.0mm · 0.45mm/px · z∈[-74,+75]mm · 2 of 24 slices shown (2 of 3)]
[im 1/24]
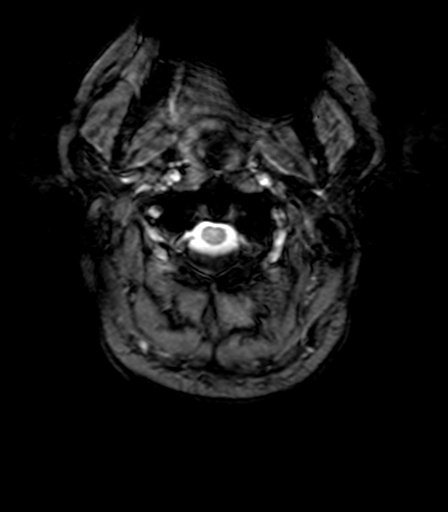
[im 24/24]
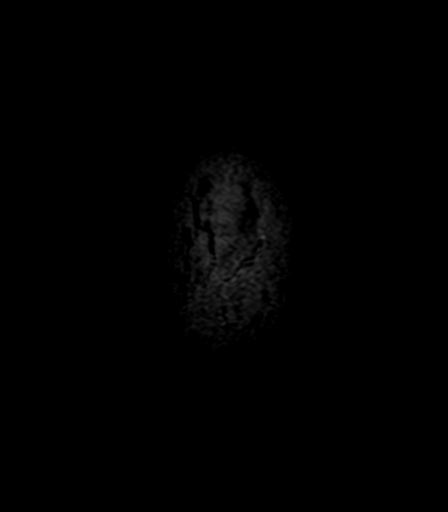

[Series 15: T1 · axial · 3.0mm · 1.00mm/px · z∈[-76,+77]mm · 5 of 52 slices shown (2 of 2)]
[im 1/52]
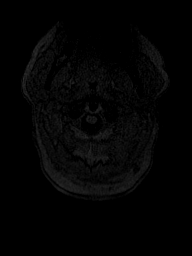
[im 13/52]
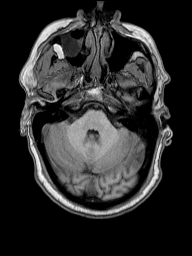
[im 26/52]
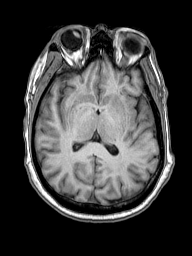
[im 39/52]
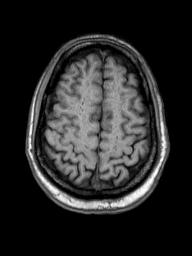
[im 52/52]
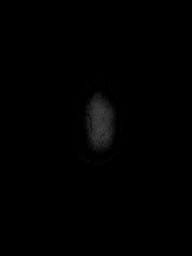

[Series 16: T2 · coronal · 5.0mm · 0.49mm/px · 2 of 27 slices shown (3 of 3)]
[im 1/27]
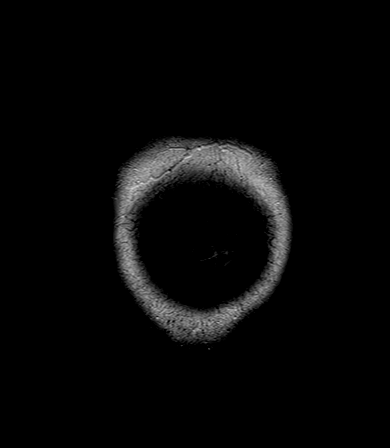
[im 27/27]
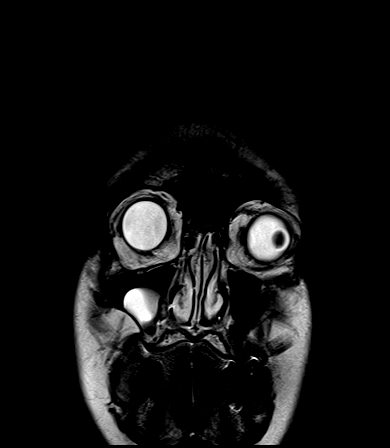

[Series 100: DWI · axial · 3.0mm · 1.80mm/px · z∈[-81,+81]mm · 5 of 55 slices shown (1 of 2)]
[im 1/55]
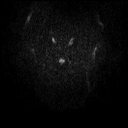
[im 14/55]
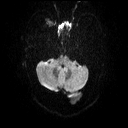
[im 28/55]
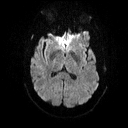
[im 41/55]
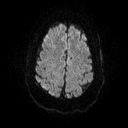
[im 55/55]
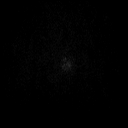

[Series 101: ADC · axial · 3.0mm · 1.80mm/px · z∈[-81,+81]mm · 5 of 50 slices shown (1 of 2)]
[im 1/50]
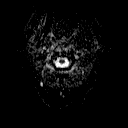
[im 13/50]
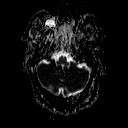
[im 25/50]
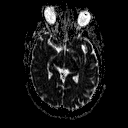
[im 37/50]
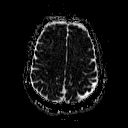
[im 50/50]
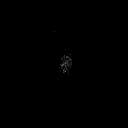

[Series 108: DWI · coronal · 3.0mm · 1.80mm/px · 4 of 47 slices shown (2 of 2)]
[im 1/47]
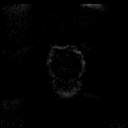
[im 16/47]
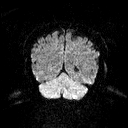
[im 31/47]
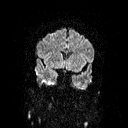
[im 47/47]
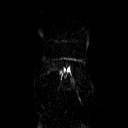

[Series 109: ADC · coronal · 3.0mm · 1.80mm/px · 4 of 48 slices shown (2 of 2)]
[im 1/48]
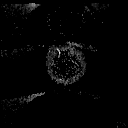
[im 16/48]
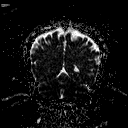
[im 32/48]
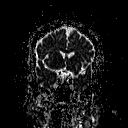
[im 48/48]
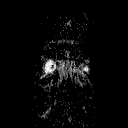

[42 of 48 positions shown; findings below may reference images not displayed]

FINDINGS: MR HEAD FINDINGS

Calvarium and upper cervical spine: No focal marrow signal
abnormality.

Orbits: Gaze to the left.

Sinuses and Mastoids: Mucous retention cysts in the maxillary antra.
Mild mucosal thickening throughout right mastoid air cells.

Brain: No acute infarct, hemorrhage, hydrocephalus, or mass lesion.
No evidence of large vessel occlusion. Remote small vessel infarct
in the left cerebellum. No noted ischemic changes in cerebral white
matter. No atrophy. Symmetric signal in the temporal bones and
internal auditory canals.

MR CIRCLE OF WILLIS FINDINGS

Symmetric vertebral arteries.  Standard vertebrobasilar branching.

Symmetric carotid arteries and carotid branching. Sizable posterior
communicating arteries. Present anterior communicating artery.

No major branch occlusion or flow limiting stenosis. No evidence of
dural fistula. Negative for aneurysm.

MRA NECK FINDINGS

Time-of-flight imaging shows antegrade flow in the major cervical
arteries.

Negative aortic arch. Two vessel branching pattern. Both carotids
are smooth and widely patent. Both vertebrals are smooth and widely
patent.
IMPRESSION: 1. No acute finding, including infarct. Negative cervical and
intracranial MRA.
2. Mild mucosal thickening in right mastoid air cells.

## 2017-07-08 DIAGNOSIS — F410 Panic disorder [episodic paroxysmal anxiety] without agoraphobia: Secondary | ICD-10-CM | POA: Insufficient documentation

## 2017-07-09 ENCOUNTER — Telehealth: Payer: Self-pay | Admitting: Neurology

## 2017-07-09 NOTE — Telephone Encounter (Signed)
Patient has appt with Dr. Trenton Gammon on 07/12/2017 at 2:30 pm.

## 2017-07-16 ENCOUNTER — Telehealth: Payer: Self-pay | Admitting: Neurology

## 2017-07-16 NOTE — Telephone Encounter (Signed)
Pt saw Dr. Annette Stable.  Felt that pt had large c6-7 disc herniation but was asymptommatic so held on recommending sx intervention

## 2017-07-23 ENCOUNTER — Encounter: Payer: Self-pay | Admitting: Internal Medicine

## 2017-07-30 ENCOUNTER — Ambulatory Visit (INDEPENDENT_AMBULATORY_CARE_PROVIDER_SITE_OTHER): Payer: No Typology Code available for payment source | Admitting: *Deleted

## 2017-07-30 DIAGNOSIS — R55 Syncope and collapse: Secondary | ICD-10-CM | POA: Diagnosis not present

## 2017-07-30 NOTE — Progress Notes (Signed)
Carelink Summary Report / Loop Recorder 

## 2017-08-01 LAB — CUP PACEART REMOTE DEVICE CHECK
Date Time Interrogation Session: 20181014143812
MDC IDC PG IMPLANT DT: 20180716

## 2017-08-02 ENCOUNTER — Encounter: Payer: Self-pay | Admitting: Internal Medicine

## 2017-08-02 ENCOUNTER — Ambulatory Visit (INDEPENDENT_AMBULATORY_CARE_PROVIDER_SITE_OTHER): Payer: No Typology Code available for payment source | Admitting: Internal Medicine

## 2017-08-02 VITALS — BP 124/82 | HR 59 | Ht 71.0 in | Wt 193.0 lb

## 2017-08-02 DIAGNOSIS — I422 Other hypertrophic cardiomyopathy: Secondary | ICD-10-CM

## 2017-08-02 DIAGNOSIS — R002 Palpitations: Secondary | ICD-10-CM

## 2017-08-02 LAB — CUP PACEART INCLINIC DEVICE CHECK
MDC IDC PG IMPLANT DT: 20180716
MDC IDC SESS DTM: 20181018145106

## 2017-08-02 NOTE — Patient Instructions (Addendum)
Medication Instructions:  Your physician recommends that you continue on your current medications as directed. Please refer to the Current Medication list given to you today.  -- If you need a refill on your cardiac medications before your next appointment, please call your pharmacy. --  Labwork: None ordered  Testing/Procedures: None ordered  Follow-Up: Your physician wants you to follow-up in:  1 year with Tommye Standard, PA. You will receive a reminder letter in the mail two months in advance. If you don't receive a letter, please call our office to schedule the follow-up appointment.  Thank you for choosing [facility]!!    [phone]  Any Other Special Instructions Will Be Listed Below (If Applicable).

## 2017-08-02 NOTE — Progress Notes (Signed)
Patient Care Team: Delilah Shan, MD as PCP - General (Family Medicine)   HPI  Duane Warren is a 61 y.o. 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 He underwent loop recorder implantation for assessment of the latter.  No interval tachycardia palpitations or syncope. Denies chest pain or shortness of breath.  He is now on an anxiolytic.  Records and Results Reviewed   Date Cr K  6/18 1.21 4.5          Past Medical History:  Diagnosis Date  . Anxiety   . Apical variant hypertrophic cardiomyopathy ([place]) 01/18/2017  . Enlarged heart   . Heart disease   . Heart murmur   . Hyperaldosteronism ([place])   . Hypertension     Past Surgical History:  Procedure Laterality Date  . HERNIA REPAIR    . KNEE SURGERY     unknown which side  . LOOP RECORDER INSERTION N/A 04/30/2017   Procedure: Loop Recorder Insertion;  Surgeon: Deboraha Sprang, MD;  Location: [place] [facility];  Service: Cardiovascular;  Laterality: N/A;  . SKIN GRAFT      Current Outpatient Prescriptions  Medication Sig Dispense Refill  . albuterol (PROVENTIL HFA;VENTOLIN HFA) 108 (90 Base) MCG/ACT inhaler Inhale 2 puffs into the lungs every 6 (six) hours as needed for wheezing or shortness of breath.    [name] amLODipine (NORVASC) 10 MG tablet Take 10 mg by mouth daily.  0  . Cholecalciferol (VITAMIN D) 2000 units tablet Take 2,000 Units by mouth daily.    . colchicine 0.6 MG tablet Take 0.6-1.2 mg by mouth as directed. Take 1.2 mg by mouth at first sign of gout flare, then if symptoms persist take 0.6 mg daily until symptoms subside    . escitalopram (LEXAPRO) 10 MG tablet Take 10 mg by mouth daily.    [name] losartan (COZAAR) 50 MG tablet Take 50 mg by mouth daily.  0  . MAGNESIUM-POTASSIUM PO Take 1 tablet by mouth 4 (four) times a week.    . OLOPATADINE HCL NA Place 2 sprays into the nose daily as needed (congestion).    . Omega-3 Fatty Acids (OMEGA 3 PO)  Take 1,600 mg by mouth 4 (four) times a week.    . rosuvastatin (CRESTOR) 10 MG tablet Take 1 tablet (10 mg total) by mouth daily. 90 tablet 3  . spironolactone (ALDACTONE) 25 MG tablet Take 25 mg by mouth daily.  6  . vitamin C (ASCORBIC ACID) 500 MG tablet Take 500 mg by mouth 4 (four) times a week.     No current facility-administered medications for this visit.     No Known Allergies    Review of Systems negative except from HPI and PMH  Physical Exam BP 124/82   Pulse (!) 59   Ht 5\' 11"  (1.803 m)   Wt 193 lb (87.5 kg)   SpO2 96%   BMI 26.92 kg/m  Well developed and well nourished in no acute distress HENT normal E scleral and icterus clear Neck Supple JVP flat; carotids brisk and full Clear to ausculation RFever egular rate and rhythm, no murmurs gallops or rub Soft with active bowel sounds No clubbing cyanosis  Edema Alert and oriented, grossly normal motor and sensory function Skin Warm and Dry  ECG nsr 59 15/09/44  Assessment and  Plan  Hypertrophic cardiomyopathy- apical  Palpitations  Syncope  Implantable loop recorder  No interval syncope.   No  palpitations.   Functional status stable.   Device interrogation demonstrated no A. fib    Current medicines are reviewed at length with the patient today .  The patient does not have concerns regarding medicines.

## 2017-08-03 ENCOUNTER — Encounter: Payer: Self-pay | Admitting: Internal Medicine

## 2017-08-28 ENCOUNTER — Ambulatory Visit (INDEPENDENT_AMBULATORY_CARE_PROVIDER_SITE_OTHER): Payer: No Typology Code available for payment source | Admitting: *Deleted

## 2017-08-28 DIAGNOSIS — R55 Syncope and collapse: Secondary | ICD-10-CM

## 2017-08-28 NOTE — Progress Notes (Signed)
Carelink Summary Report / Loop Recorder 

## 2017-09-11 LAB — CUP PACEART REMOTE DEVICE CHECK
Date Time Interrogation Session: 20181113151111
MDC IDC PG IMPLANT DT: 20180716

## 2017-09-27 ENCOUNTER — Ambulatory Visit (INDEPENDENT_AMBULATORY_CARE_PROVIDER_SITE_OTHER): Payer: No Typology Code available for payment source | Admitting: *Deleted

## 2017-09-27 DIAGNOSIS — R55 Syncope and collapse: Secondary | ICD-10-CM | POA: Diagnosis not present

## 2017-09-27 NOTE — Progress Notes (Signed)
Carelink Summary Report / Loop Recorder 

## 2017-10-10 LAB — CUP PACEART REMOTE DEVICE CHECK
Implantable Pulse Generator Implant Date: 20180716
MDC IDC SESS DTM: 20181213171025

## 2017-10-29 ENCOUNTER — Ambulatory Visit (INDEPENDENT_AMBULATORY_CARE_PROVIDER_SITE_OTHER): Payer: No Typology Code available for payment source | Admitting: *Deleted

## 2017-10-29 DIAGNOSIS — R55 Syncope and collapse: Secondary | ICD-10-CM | POA: Diagnosis not present

## 2017-10-30 NOTE — Progress Notes (Signed)
Carelink Summary Report / Loop Recorder 

## 2017-11-06 LAB — CUP PACEART REMOTE DEVICE CHECK
Date Time Interrogation Session: 20190112173953
Implantable Pulse Generator Implant Date: 20180716

## 2017-11-26 ENCOUNTER — Ambulatory Visit (INDEPENDENT_AMBULATORY_CARE_PROVIDER_SITE_OTHER): Payer: No Typology Code available for payment source | Admitting: *Deleted

## 2017-11-26 DIAGNOSIS — R55 Syncope and collapse: Secondary | ICD-10-CM | POA: Diagnosis not present

## 2017-11-26 DIAGNOSIS — I2510 Atherosclerotic heart disease of native coronary artery without angina pectoris: Secondary | ICD-10-CM | POA: Insufficient documentation

## 2017-11-26 NOTE — Progress Notes (Deleted)
Cardiology Office Note  Date:  11/26/2017   ID:  Duane Warren, DOB Jan 02, 1956, MRN [id number]  PCP:  Duane Mylar, MD   No chief complaint on file.   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 cardiac workup done in [place] for abnormal EKG. dizzy spells in the past. Workup at that time included treadmill and echocardiogram which by his report were normal. We have been unable to obtain his echocardiogram report. 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   Reports blood pressure relatively well-controlled Denies any new complaints, no shortness of breath, no leg edema  EKG shows no sinus rhythm with rate 64 bpm, diffuse T-wave abnormality to the anterior precordial leads, anterolateral leads, now also inferior leads  other past medical history  previouslyld that he might have depression and was started on a medication for depression. He did not feel well and was weaned off the medication. It was recommended  that he start an alternate medication but he did not start this. He reports that since then, his dizziness has resolved.    he had extensive lab work in March 2014. Total cholesterol 189, HDL 56, LDL 104, lipoprotein (a) 61, CRP 3.4, hemoglobin A1c 5.5, normal LFTs, testosterone 387, Apo B 72 TSH 2.1 Reports that there is mild family history of cancer, hypertension him a CAD Blood pressure at home is typically in the 120-130 range over 60-70   PMH:   has a past medical history of Anxiety, Apical variant hypertrophic cardiomyopathy ([facility]) (01/18/2017), Enlarged heart, Heart disease, Heart murmur, Hyperaldosteronism ([facility]), and Hypertension.  PSH:    Past Surgical History:  Procedure Laterality Date  . HERNIA REPAIR    . KNEE SURGERY     unknown which side  . LOOP RECORDER INSERTION N/A 04/30/2017   Procedure: Loop Recorder Insertion;  Surgeon: Duane Salvia, MD;  Location: [facility];  Service: Cardiovascular;  Laterality: N/A;  . SKIN GRAFT      Current Outpatient Medications  Medication Sig Dispense Refill  . albuterol (PROVENTIL HFA;VENTOLIN HFA) 108 (90 Base) MCG/ACT inhaler Inhale 2 puffs into the lungs every 6 (six) hours as needed for wheezing or shortness of breath.    [name] amLODipine (NORVASC) 10 MG tablet Take 10 mg by mouth daily.  0  . Cholecalciferol (VITAMIN D) 2000 units tablet Take 2,000 Units by mouth daily.    . colchicine 0.6 MG tablet Take 0.6-1.2 mg by mouth as directed. Take 1.2 mg by mouth at first sign of gout flare, then  if symptoms persist take 0.6 mg daily until symptoms subside    . escitalopram (LEXAPRO) 10 MG tablet Take 10 mg by mouth daily.    [name] losartan (COZAAR) 50 MG tablet Take 50 mg by mouth daily.  0  . MAGNESIUM-POTASSIUM PO Take 1 tablet by mouth 4 (four) times a week.    . OLOPATADINE HCL NA Place 2 sprays into the nose daily as needed (congestion).    . Omega-3 Fatty Acids (OMEGA 3 PO) Take 1,600 mg by mouth 4 (four) times a week.    .  rosuvastatin (CRESTOR) 10 MG tablet Take 1 tablet (10 mg total) by mouth daily. 90 tablet 3  . spironolactone (ALDACTONE) 25 MG tablet Take 25 mg by mouth daily.  6  . vitamin C (ASCORBIC ACID) 500 MG tablet Take 500 mg by mouth 4 (four) times a week.     No current facility-administered medications for this visit.      Allergies:   Patient has no known allergies.   Social History:  The patient  reports that  has never smoked. he has never used smokeless tobacco. He reports that he drinks about 0.6 oz of alcohol per week. He reports that he does not use drugs.   Family History:   family history includes Heart attack in his father; Hypertension in his father and mother; Spina bifida in his sister.    Review of Systems: Review of Systems  Constitutional: Negative.   Respiratory: Negative.   Cardiovascular: Negative.   Gastrointestinal: Negative.   Musculoskeletal: Negative.   Neurological: Negative.   Psychiatric/Behavioral: Negative.   All other systems reviewed and are negative.    PHYSICAL EXAM: VS:  There were no vitals taken for this visit. , BMI There is no height or weight on file to calculate BMI. GEN: Well nourished, well developed, in no acute distress  HEENT: normal  Neck: no JVD, carotid bruits, or masses Cardiac: RRR; no murmurs, rubs, or gallops,no edema  Respiratory:  clear to auscultation bilaterally, normal work of breathing GI: soft, nontender, nondistended, + BS MS: no deformity or atrophy  Skin: warm and dry, no rash Neuro:  Strength and sensation are intact Psych: euthymic mood, full affect    Recent Labs: 12/22/2016: ALT 33 01/03/2017: Creatinine, Ser 1.30    Lipid Panel Lab Results  Component Value Date   CHOL 116 12/22/2016   HDL 51 12/22/2016   LDLCALC 53 12/22/2016   TRIG 59 12/22/2016      Wt Readings from Last 3 Encounters:  08/02/17 193 lb (87.5 kg)  06/11/17 188 lb (85.3 kg)  04/30/17 190 lb (86.2 kg)       ASSESSMENT AND  PLAN:  Essential hypertension Blood pressure is well controlled on today's visit. No changes made to the medications.  Atherosclerosis of both carotid arteries We'll discuss with him in follow-up, will likely benefit from carotid ultrasound  Class 2 obesity due to excess calories without serious comorbidity with body mass index (BMI) of 35.0 to 35.9 in adult We have encouraged continued exercise, careful diet management in an effort to lose weight.  Coronary artery disease involving native coronary artery of native heart without angina pectoris Elevated CT coronary calcium score Images reviewed with him in detail today. Stressed importance of aggressive cholesterol control We have ordered a lipid panel  Mixed hyperlipidemia Repeat lipid panel ordered, goal LDL less than 70  Apical thickening Seen on echocardiogram, not seen on CT scan We will order cardiac MRI given  dramatic EKG changes If he does have moderate to severe apical thickening as seen on echo, we'll discuss with EP No significant risk factors for sudden death, reports father died from MI   Total encounter time more than 25 minutes  Greater than 50% was spent in counseling and coordination of care with the patient   Disposition:   F/U  6 months   No orders of the defined types were placed in this encounter.    Signed, Dossie Arbour, M.D., Ph.D. 11/26/2017  [facility] [place], [place] [phone]

## 2017-11-27 ENCOUNTER — Ambulatory Visit: Payer: No Typology Code available for payment source | Admitting: Cardiovascular Disease

## 2017-11-27 ENCOUNTER — Ambulatory Visit: Payer: No Typology Code available for payment source | Admitting: Nurse Practitioner

## 2017-11-27 ENCOUNTER — Encounter: Payer: Self-pay | Admitting: Nurse Practitioner

## 2017-11-27 VITALS — BP 130/68 | HR 64 | Ht 71.0 in | Wt 196.0 lb

## 2017-11-27 DIAGNOSIS — R0609 Other forms of dyspnea: Secondary | ICD-10-CM | POA: Diagnosis not present

## 2017-11-27 DIAGNOSIS — E782 Mixed hyperlipidemia: Secondary | ICD-10-CM

## 2017-11-27 DIAGNOSIS — I422 Other hypertrophic cardiomyopathy: Secondary | ICD-10-CM | POA: Diagnosis not present

## 2017-11-27 DIAGNOSIS — R931 Abnormal findings on diagnostic imaging of heart and coronary circulation: Secondary | ICD-10-CM | POA: Diagnosis not present

## 2017-11-27 DIAGNOSIS — I10 Essential (primary) hypertension: Secondary | ICD-10-CM

## 2017-11-27 NOTE — Progress Notes (Signed)
Office Visit    Patient Name: Duane Warren Date of Encounter: 11/27/2017  Primary Care Provider:  Delilah Shan, MD Primary Cardiologist:  Ida Rogue, MD / EP: Olin Pia, MD   Chief Complaint    62 year old male with a history of apical variant hypertrophic cardiomyopathy, syncope status post implantable loop recorder, elevated coronary calcium, hypertension, dilated aortic root, and hyperaldosteronism, who presents for follow-up related to dyspnea.  Past Medical History    Past Medical History:  Diagnosis Date  . [place]at[place] coronary artery calcium score greater than 400    a. 12/2015 Cardiac CT: Ca2+ score of 726 (91st %'ile).  . Anxiety   . Apical variant hypertrophic cardiomyopathy ([place])    a. 01/2016 Echo: EF 55-60%, no rwma, mild AI, Ao root 72mm, mild MR, mod dil LA, mod TR, nl PASP; b. 12/2016 Cardiac MRI: EF 64%, sev apical hypertrophy up to 64mm in diastole. LGE in apical inf, lateral, and apical walls.  . Dilated aortic root ([place])    a. 01/2016 Ao root 57mm; b. 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  [name] Hyperaldosteronism ([place])   . Hypertension   . Syncope    a. s/p MDT Linq.   Past Surgical History:  Procedure Laterality Date  . HERNIA REPAIR    . KNEE SURGERY     unknown which side  . LOOP RECORDER INSERTION N/A 04/30/2017   Procedure: Loop Recorder Insertion;  Surgeon: Deboraha Sprang, MD;  Location: [facility];  Service: Cardiovascular;  Laterality: N/A;  . SKIN GRAFT      Allergies  No Known Allergies  History of Present Illness    62 year old male with the above complex past medical history including hypertrophic cardiomyopathy-apical variant, dilated aortic root, hypertension, syncope, anxiety, hyperaldosteronism, and elevated coronary calcium score.  Previous echo in April 2017 showed normal LV function with an aortic root of 39 mm.  A cardiac MRI in March 2018 showed normal LV function with severe apical hypertrophy  up to 22 mm in diastole with late gadolinium enhancement and apical inferior, lateral, and apical walls.  He was subsequently referred to electrophysiology and underwent implantable loop recorder placement in the setting of prior history of syncope.  Since an implantable loop recorder placement, he has not suffered any syncope.  Last interrogation in December showed no arrhythmias.  [name] reports that over the past month, he has been having some dyspnea on exertion.  This started shortly after developing upper respiratory infection which required treatment with a Z-Pak and cough medicine.  As he began to improve with therapy, he got a secondary infection, potentially from his wife and was placed on another round of antibiotics.  About 2 weeks ago, he was noticing significant dyspnea on exertion with wheezing when he was walking to and from a basketball game in [place].  This was a little bit better when he went to a game last week.  He thinks that is continued to improve but is not yet back at baseline.  He has not been having any chest pain, PND, orthopnea, dizziness, syncope, edema, palpitations, or early satiety.  Home Medications    Prior to Admission medications   Medication Sig Start Date End Date Taking? Authorizing Provider  albuterol (PROVENTIL HFA;VENTOLIN HFA) 108 (90 Base) MCG/ACT inhaler Inhale 2 puffs into the lungs every 6 (six) hours as needed for wheezing or shortness of breath.   Yes [provider]  amLODipine (NORVASC) 10 MG tablet Take 10 mg by mouth daily. 09/17/14  Yes [provider]  Cholecalciferol (VITAMIN D) 2000 units tablet Take 2,000 Units by mouth daily.   Yes [provider]  colchicine 0.6 MG tablet Take 0.6-1.2 mg by mouth as directed. Take 1.2 mg by mouth at first sign of gout flare, then if symptoms persist take 0.6 mg daily until symptoms subside 12/29/16 12/29/17 Yes [provider]  escitalopram (LEXAPRO) 10 MG tablet  Take 10 mg by mouth daily. 05/23/17  Yes [provider]  losartan (COZAAR) 50 MG tablet Take 50 mg by mouth daily. 09/17/14  Yes [provider]  OLOPATADINE HCL NA Place 2 sprays into the nose daily as needed (congestion).   Yes [provider]  rosuvastatin (CRESTOR) 10 MG tablet Take 1 tablet (10 mg total) by mouth daily. 12/21/16 12/21/17 Yes Gollan, Kathlene November, MD  spironolactone (ALDACTONE) 25 MG tablet Take 25 mg by mouth daily. 09/28/14  Yes [provider]  vitamin C (ASCORBIC ACID) 500 MG tablet Take 500 mg by mouth 4 (four) times a week.   Yes [provider]    Review of Systems    Dyspnea associated with wheezing over the past few weeks as outlined above.  This has improved some.  No chest pain, palpitations, PND, orthopnea, dizziness, syncope, edema, or early satiety.  All other systems reviewed and are otherwise negative except as noted above.  Physical Exam    VS:  BP 130/68 (BP Location: Left Arm, Patient Position: Sitting, Cuff Size: Normal)   Pulse 64   Ht 5\' 11"  (1.803 m)   Wt 196 lb (88.9 kg)   BMI 27.34 kg/m  , BMI Body mass index is 27.34 kg/m. GEN: Well nourished, well developed, in no acute distress.  HEENT: normal.  Neck: Supple, no JVD, carotid bruits, or masses. Cardiac: RRR, no murmurs, rubs, or gallops. No clubbing, cyanosis, edema.  Radials/DP/PT 2+ and equal bilaterally.  Respiratory:  Respirations regular and unlabored, clear to auscultation bilaterally. GI: Soft, nontender, nondistended, BS + x 4. MS: no deformity or atrophy. Skin: warm and dry, no rash. Neuro:  Strength and sensation are intact. Psych: Normal affect.  Accessory Clinical Findings    ECG -regular sinus rhythm, 64, PAC, left atrial enlargement, LVH with repolarization abnormality.  No acute changes.  Assessment & Plan    1.  Dyspnea on exertion: Over the past few weeks to 1 month, patient has had some dyspnea on exertion in the setting of  recurrent upper respiratory infections.  Dyspnea was initially associated with wheezing.  He has had 2 rounds of antibiotics and has been taking cough medicine with some improvement but he has not yet back at baseline.  He has not had any significant weight gain, PND, orthopnea, edema, or chest pain.  He is euvolemic on exam today.  Given his history of hypertrophic cardiomyopathy-apical variant, I will arrange for a follow-up echocardiogram.  I suspect that symptoms may have been largely driven by upper respiratory infections.  2.  Apical variant hypertrophic cardiomyopathy: Previous normal LV function with abnormal cardiac MRI in March 2018.  In light of recent dyspnea, will follow-up echo.  He has not had any palpitations, presyncope, or syncope.  No events on monitoring as of December device transmission.  3.  Essential hypertension: Blood pressure stable at 130/68.  He remains on amlodipine, losartan, and spironolactone.  4.  Hyperlipidemia: LDL was 59 in January with normal LFTs at  that time.  He remains on statin therapy.  5.  Elevated coronary calcium: Coronary calcium score of 726 in March 2017, placing him in the 91st percentile.  He remains on statin therapy with an LDL of 59 in January.  6.  Disposition: Follow-up echo with plan to follow-up with Dr. Rockey Situ or Caryl Comes in 1-2 months or sooner for abnormal echo.   Murray Hodgkins, NP 11/27/2017, 3:26 PM

## 2017-11-27 NOTE — Patient Instructions (Signed)
Medication Instructions:  Your physician recommends that you continue on your current medications as directed. Please refer to the Current Medication list given to you today.   Labwork: none  Testing/Procedures: Your physician has requested that you have an echocardiogram. Echocardiography is a painless test that uses sound waves to create images of your heart. It provides your doctor with information about the size and shape of your heart and how well your heart's chambers and valves are working. This procedure takes approximately one hour. There are no restrictions for this procedure.    Follow-Up: Your physician recommends that you schedule a follow-up appointment in: 3-6 [place].  If you need a refill on your cardiac medications before your next appointment, please call your pharmacy.   Echocardiogram An echocardiogram, or echocardiography, uses sound waves (ultrasound) to produce an image of your heart. The echocardiogram is simple, painless, obtained within a short period of time, and offers valuable information to your health care provider. The images from an echocardiogram can provide information such as:  Evidence of coronary artery disease (CAD).  Heart size.  Heart muscle function.  Heart valve function.  Aneurysm detection.  Evidence of a past heart attack.  Fluid buildup around the heart.  Heart muscle thickening.  Assess heart valve function.  Tell a health care provider about:  Any allergies you have.  All medicines you are taking, including vitamins, herbs, eye drops, creams, and over-the-counter medicines.  Any problems you or family members have had with anesthetic medicines.  Any blood disorders you have.  Any surgeries you have had.  Any medical conditions you have.  Whether you are pregnant or may be pregnant. What happens before the procedure? No special preparation is needed. Eat and drink normally. What happens during the  procedure?  In order to produce an image of your heart, gel will be applied to your chest and a wand-like tool (transducer) will be moved over your chest. The gel will help transmit the sound waves from the transducer. The sound waves will harmlessly bounce off your heart to allow the heart images to be captured in real-time motion. These images will then be recorded.  You may need an IV to receive a medicine that improves the quality of the pictures. What happens after the procedure? You may return to your normal schedule including diet, activities, and medicines, unless your health care provider tells you otherwise. This information is not intended to replace advice given to you by your health care provider. Make sure you discuss any questions you have with your health care provider. Document Released: 09/29/2000 Document Revised: 05/20/2016 Document Reviewed: 06/09/2013 Elsevier Interactive Patient Education  2017 [employer].

## 2017-11-27 NOTE — Progress Notes (Signed)
Carelink Summary Report / Loop Recorder 

## 2017-12-05 ENCOUNTER — Ambulatory Visit: Payer: No Typology Code available for payment source | Admitting: Nurse Practitioner

## 2017-12-18 ENCOUNTER — Other Ambulatory Visit: Payer: No Typology Code available for payment source

## 2017-12-25 LAB — CUP PACEART REMOTE DEVICE CHECK
Date Time Interrogation Session: 20190211220527
Implantable Pulse Generator Implant Date: 20180716

## 2017-12-28 ENCOUNTER — Other Ambulatory Visit: Payer: Self-pay | Admitting: Cardiovascular Disease

## 2017-12-31 ENCOUNTER — Ambulatory Visit (INDEPENDENT_AMBULATORY_CARE_PROVIDER_SITE_OTHER): Payer: No Typology Code available for payment source | Admitting: *Deleted

## 2017-12-31 ENCOUNTER — Other Ambulatory Visit: Payer: No Typology Code available for payment source

## 2017-12-31 DIAGNOSIS — R55 Syncope and collapse: Secondary | ICD-10-CM | POA: Diagnosis not present

## 2017-12-31 NOTE — Progress Notes (Signed)
Carelink Summary Report / Loop Recorder 

## 2018-01-18 ENCOUNTER — Ambulatory Visit (INDEPENDENT_AMBULATORY_CARE_PROVIDER_SITE_OTHER): Payer: No Typology Code available for payment source

## 2018-01-18 ENCOUNTER — Other Ambulatory Visit: Payer: Self-pay

## 2018-01-18 DIAGNOSIS — I422 Other hypertrophic cardiomyopathy: Secondary | ICD-10-CM | POA: Diagnosis not present

## 2018-01-18 DIAGNOSIS — R0609 Other forms of dyspnea: Secondary | ICD-10-CM

## 2018-01-31 ENCOUNTER — Ambulatory Visit (INDEPENDENT_AMBULATORY_CARE_PROVIDER_SITE_OTHER): Payer: No Typology Code available for payment source | Admitting: *Deleted

## 2018-01-31 DIAGNOSIS — R55 Syncope and collapse: Secondary | ICD-10-CM | POA: Diagnosis not present

## 2018-02-01 NOTE — Progress Notes (Signed)
Carelink Summary Report / Loop Recorder 

## 2018-02-05 LAB — CUP PACEART REMOTE DEVICE CHECK
Implantable Pulse Generator Implant Date: 20180716
MDC IDC SESS DTM: 20190316180903

## 2018-03-01 LAB — CUP PACEART REMOTE DEVICE CHECK
MDC IDC PG IMPLANT DT: 20180716
MDC IDC SESS DTM: 20190418180915

## 2018-03-05 ENCOUNTER — Ambulatory Visit (INDEPENDENT_AMBULATORY_CARE_PROVIDER_SITE_OTHER): Payer: No Typology Code available for payment source | Admitting: *Deleted

## 2018-03-05 DIAGNOSIS — R55 Syncope and collapse: Secondary | ICD-10-CM

## 2018-03-06 NOTE — Progress Notes (Signed)
Carelink Summary Report / Loop Recorder 

## 2018-04-01 LAB — CUP PACEART REMOTE DEVICE CHECK
Date Time Interrogation Session: 20190521183718
MDC IDC PG IMPLANT DT: 20180716

## 2018-04-08 ENCOUNTER — Ambulatory Visit (INDEPENDENT_AMBULATORY_CARE_PROVIDER_SITE_OTHER): Payer: No Typology Code available for payment source | Admitting: *Deleted

## 2018-04-08 DIAGNOSIS — R55 Syncope and collapse: Secondary | ICD-10-CM | POA: Diagnosis not present

## 2018-04-08 NOTE — Progress Notes (Signed)
Carelink Summary Report / Loop Recorder 

## 2018-05-10 ENCOUNTER — Ambulatory Visit (INDEPENDENT_AMBULATORY_CARE_PROVIDER_SITE_OTHER): Payer: No Typology Code available for payment source | Admitting: *Deleted

## 2018-05-10 DIAGNOSIS — R55 Syncope and collapse: Secondary | ICD-10-CM | POA: Diagnosis not present

## 2018-05-13 NOTE — Progress Notes (Signed)
Carelink Summary Report / Loop Recorder 

## 2018-05-21 LAB — CUP PACEART REMOTE DEVICE CHECK
Date Time Interrogation Session: 20190623194021
Implantable Pulse Generator Implant Date: 20180716

## 2018-06-12 ENCOUNTER — Ambulatory Visit (INDEPENDENT_AMBULATORY_CARE_PROVIDER_SITE_OTHER): Payer: No Typology Code available for payment source | Admitting: *Deleted

## 2018-06-12 DIAGNOSIS — R55 Syncope and collapse: Secondary | ICD-10-CM | POA: Diagnosis not present

## 2018-06-13 NOTE — Progress Notes (Signed)
Carelink Summary Report / Loop Recorder 

## 2018-06-24 LAB — CUP PACEART REMOTE DEVICE CHECK
MDC IDC PG IMPLANT DT: 20180716
MDC IDC SESS DTM: 20190726203615

## 2018-07-09 LAB — CUP PACEART REMOTE DEVICE CHECK
Date Time Interrogation Session: 20190828213535
MDC IDC PG IMPLANT DT: 20180716

## 2018-07-15 ENCOUNTER — Ambulatory Visit (INDEPENDENT_AMBULATORY_CARE_PROVIDER_SITE_OTHER): Payer: No Typology Code available for payment source | Admitting: *Deleted

## 2018-07-15 DIAGNOSIS — R55 Syncope and collapse: Secondary | ICD-10-CM | POA: Diagnosis not present

## 2018-07-16 NOTE — Progress Notes (Signed)
Carelink Summary Report / Loop Recorder 

## 2018-07-17 ENCOUNTER — Telehealth: Payer: Self-pay | Admitting: *Deleted

## 2018-07-17 LAB — CUP PACEART REMOTE DEVICE CHECK
Date Time Interrogation Session: 20190930220807
MDC IDC PG IMPLANT DT: 20180716

## 2018-07-17 NOTE — Telephone Encounter (Signed)
Spoke with patient regarding tachy episode noted on LINQ from 07/14/18 at 2122, duration 8sec, median V rate 188bpm. ECG appears WCT. Patient denies symptoms with episode. He agrees to call the DC for any presyncopal/syncopal episodes. Advised will review episode with Dr. Caryl Comes when he is back in the office and call if any additional recommendations. Patient appreciative of call and denies questions at this time.

## 2018-07-19 NOTE — Telephone Encounter (Signed)
Called pt back and let him know that I would include this information when episode is reviewed with Dr.Klein pt voiced understanding

## 2018-07-19 NOTE — Telephone Encounter (Signed)
Patient calling back to office to discuss this event.  Patient states he was sick and believes he was vomiting at this time. Please call to discuss.

## 2018-07-23 NOTE — Telephone Encounter (Signed)
Spoke with patient, advised that Dr. Caryl Comes reviewed episode and recommended no changes at this time. Patient is agreeable to scheduling f/u appointment (per recall) on 08/27/18 at the [place] office. He verbalizes understanding and denies questions or concerns at this time.

## 2018-07-25 ENCOUNTER — Encounter: Payer: Self-pay | Admitting: Family Medicine

## 2018-07-25 ENCOUNTER — Ambulatory Visit (INDEPENDENT_AMBULATORY_CARE_PROVIDER_SITE_OTHER): Payer: No Typology Code available for payment source | Admitting: Family Medicine

## 2018-07-25 VITALS — BP 140/89 | HR 71 | Ht 71.0 in | Wt 205.8 lb

## 2018-07-25 DIAGNOSIS — F410 Panic disorder [episodic paroxysmal anxiety] without agoraphobia: Secondary | ICD-10-CM

## 2018-07-25 DIAGNOSIS — D3502 Benign neoplasm of left adrenal gland: Secondary | ICD-10-CM

## 2018-07-25 DIAGNOSIS — I152 Hypertension secondary to endocrine disorders: Secondary | ICD-10-CM

## 2018-07-25 DIAGNOSIS — E269 Hyperaldosteronism, unspecified: Secondary | ICD-10-CM

## 2018-07-25 DIAGNOSIS — I2510 Atherosclerotic heart disease of native coronary artery without angina pectoris: Secondary | ICD-10-CM | POA: Diagnosis not present

## 2018-07-25 DIAGNOSIS — F4323 Adjustment disorder with mixed anxiety and depressed mood: Secondary | ICD-10-CM | POA: Diagnosis not present

## 2018-07-25 DIAGNOSIS — J3089 Other allergic rhinitis: Secondary | ICD-10-CM | POA: Insufficient documentation

## 2018-07-25 DIAGNOSIS — I422 Other hypertrophic cardiomyopathy: Secondary | ICD-10-CM | POA: Diagnosis not present

## 2018-07-25 DIAGNOSIS — Z7689 Persons encountering health services in other specified circumstances: Secondary | ICD-10-CM | POA: Diagnosis not present

## 2018-07-25 DIAGNOSIS — E559 Vitamin D deficiency, unspecified: Secondary | ICD-10-CM

## 2018-07-25 NOTE — Patient Instructions (Signed)
Unfortunately there is very little labs in the computer on you.  You have not had any done for over a year and a half. Please schedule a follow-up office visit with me and come in 3 to 5 days prior for fasting blood work.    How to Take Your Blood Pressure Blood pressure is a measurement of how strongly your blood is pressing against the walls of your arteries. Arteries are blood vessels that carry blood from your heart throughout your body. Your health care provider takes your blood pressure at each office visit. You can also take your own blood pressure at home with a blood pressure machine. You may need to take your own blood pressure:  To confirm a diagnosis of high blood pressure (hypertension).  To monitor your blood pressure over time.  To make sure your blood pressure medicine is working.  Supplies needed: To take your blood pressure, you will need a blood pressure machine. You can buy a blood pressure machine, or blood pressure monitor, at most drugstores or online. There are several types of home blood pressure monitors. When choosing one, consider the following:  Choose a monitor that has an arm cuff.  Choose a monitor that wraps snugly around your upper arm. You should be able to fit only one finger between your arm and the cuff.  Do not choose a monitor that measures your blood pressure from your wrist or finger.  Your health care provider can suggest a reliable monitor that will meet your needs. How to prepare To get the most accurate reading, avoid the following for 30 minutes before you check your blood pressure:  Drinking caffeine.  Drinking alcohol.  Eating.  Smoking.  Exercising.  Five minutes before you check your blood pressure:  Empty your bladder.  Sit quietly without talking in a dining chair, rather than in a soft couch or armchair.  How to take your blood pressure To check your blood pressure, follow the instructions in the manual that came with  your blood pressure monitor. If you have a digital blood pressure monitor, the instructions may be as follows: 1. Sit up straight. 2. Place your feet on the floor. Do not cross your ankles or legs. 3. Rest your left arm at the level of your heart on a table or desk or on the arm of a chair. 4. Pull up your shirt sleeve. 5. Wrap the blood pressure cuff around the upper part of your left arm, 1 inch (2.5 cm) above your elbow. It is best to wrap the cuff around bare skin. 6. Fit the cuff snugly around your arm. You should be able to place only one finger between the cuff and your arm. 7. Position the cord inside the groove of your elbow. 8. Press the power button. 9. Sit quietly while the cuff inflates and deflates. 10. Read the digital reading on the monitor screen and write it down (record it). 11. Wait 2-3 minutes, then repeat the steps, starting at step 1.  What does my blood pressure reading mean? A blood pressure reading consists of a higher number over a lower number. Ideally, your blood pressure should be below 120/80. The first ("top") number is called the systolic pressure. It is a measure of the pressure in your arteries as your heart beats. The second ("bottom") number is called the diastolic pressure. It is a measure of the pressure in your arteries as the heart relaxes. Blood pressure is classified into four stages. The following  are the stages for adults who do not have a short-term serious illness or a chronic condition. Systolic pressure and diastolic pressure are measured in a unit called mm Hg. Normal  Systolic pressure: below 130.  Diastolic pressure: below 80. Elevated  Systolic pressure: 865-784.  Diastolic pressure: below 80. Hypertension stage 1  Systolic pressure: 696-295.  Diastolic pressure: 28-41. Hypertension stage 2  Systolic pressure: 324 or above.  Diastolic pressure: 90 or above. You can have prehypertension or hypertension even if only the systolic  or only the diastolic number in your reading is higher than normal. Follow these instructions at home:  Check your blood pressure as often as recommended by your health care provider.  Take your monitor to the next appointment with your health care provider to make sure: ? That you are using it correctly. ? That it provides accurate readings.  Be sure you understand what your goal blood pressure numbers are.  Tell your health care provider if you are having any side effects from blood pressure medicine. Contact a health care provider if:  Your blood pressure is consistently high. Get help right away if:  Your systolic blood pressure is higher than 180.  Your diastolic blood pressure is higher than 110. This information is not intended to replace advice given to you by your health care provider. Make sure you discuss any questions you have with your health care provider. Document Released: 03/10/2016 Document Revised: 05/23/2016 Document Reviewed: 03/10/2016 Elsevier Interactive Patient Education  [employer].

## 2018-07-25 NOTE — Progress Notes (Signed)
New patient office visit note:   Impression and Recommendations:    1. Encounter to establish care with new doctor   2. Apical variant hypertrophic cardiomyopathy ([name])   3. Coronary artery calcification seen on CAT scan   4. Adjustment disorder with mixed anxiety and depressed mood   5. Panic disorder   6. Adenoma of left adrenal gland   7. Hyperaldosteronism ([name])   8. Hypertension due to endocrine disorder   9. Environmental and seasonal allergies   10. Vitamin D deficiency      1. Apical variant hypertrophic cardiomyopathy ([name])  - Continue to follow up with cardiology for management, as established.   2. Coronary artery calcification seen on CAT scan  - Continue to follow up with cardiology for management.   3. Adjustment disorder with mixed anxiety and depressed mood; Panic Disorder - Continue treatment as prescribed.  See med list below. - Encouraged patient to continue following up with therapist as recommended.   4. Hyperaldosteronism, adenoma of left adrenal gland  - Continue to follow up with endocrinology for management as established.   5. Hypertension due to endocrine disorder - Advised patient to sit quietly, legs uncrossed, feet flat on the ground for 15-20 minutes prior to checking his blood pressure at home.  Avoid prior stimulation with caffeine or emotional stress.   6. Environmental and Seasonal Allergies - Continue treatment as prescribed.  See med list below.   7. Vitamin D Deficiency - Need for lab work for re-evaluation.   8. BMI Counseling Explained to patient what BMI refers to, and what it means medically.    Told patient to think about it as a "medical risk stratification measurement" and how increasing BMI is associated with increasing risk/ or worsening state of various diseases such as hypertension, hyperlipidemia, diabetes, premature OA, depression etc.  American Heart Association guidelines for healthy diet, basically  Mediterranean diet, and exercise guidelines of 30 minutes 5 days per week or more discussed in detail.  Health counseling performed.  All questions answered.   9. Lifestyle & Preventative Health Maintenance - Advised patient to continue working toward exercising to improve overall mental, physical, and emotional health.    - Encouraged patient to engage in daily physical activity, especially a formal exercise routine.  Recommended that the patient eventually strive for at least 150 minutes of moderate cardiovascular activity per week according to guidelines established by the [facility].   - Healthy dietary habits encouraged, including low-carb, and high amounts of lean protein in diet.   - Patient should also consume adequate amounts of water.  - Advised patient that adequate hydration will help to alleviate muscle cramps.   Education and routine counseling performed. Handouts provided.   10. Follow-Up - Encouraged patient to continue to follow up with specialists as established. - Need for lab work today.  Patient states he had last blood work done recently.   - We will re-check fasting lab work as recommended.  - Otherwise, continue to return for CPE and chronic follow-up as scheduled.   - Patient knows to call in if desired to address acute concerns PRN.  Gross side effects, risk and benefits, and alternatives of medications discussed with patient.  Patient is aware that all medications have potential side effects and we are unable to predict every side effect or drug-drug interaction that may occur.  Expresses verbal understanding and consents to current therapy plan and treatment regimen.   Return for Chronic OV (  30) w me 6-8wks & FBW 2-3d prior.   Please see AVS handed out to patient at the end of our visit for further patient instructions/ counseling done pertaining to today's office visit.    Note:  This document was prepared using Dragon voice recognition software and may  include unintentional dictation errors.   This document serves as a record of services personally performed by [name], DO. It was created on her behalf by Toni Amend, a trained medical scribe. The creation of this record is based on the scribe's personal observations and the provider's statements to them.   I have reviewed the above medical documentation for accuracy and completeness and I concur.  [name] 07/26/18 8:54 AM    --------------------------------------------------------------------------------------------------------------------------------    Subjective:    Chief complaint:   Chief Complaint  Patient presents with  . Establish Care    HPI: Duane Warren is a pleasant 62 y.o. male who presents to [place] at [facility] today to review their medical history with me and establish care.   I asked the patient to review their chronic problem list with me to ensure everything was updated and accurate.    All recent office visits with other providers, any medical records that patient brought in etc  - I reviewed today.     We asked pt to get [place] their medical records from [facility] providers/ specialists that they had seen within the past 3-5 years- if they are in private practice and/or do not work for [employer], [facility], [place], [place] or [employer] owned practice.  Told them to call their specialists to clarify this if they are not sure.    Patient is establishing today because his wife is a patient here.  Social History Patient is a Theme park manager. Moved down here 6 years ago.  Sexually active with his wife.  Tobacco Use Never smoker.  EtOH Use Has about 1 to 1.5 glasses of wine per night.  Enjoys red wine, Cabernet.  Recreational Drug Use None.  Exercise Habits Does not exercise regularly.  States sometimes he has foot and knee issues, but knows he should do more to exercise.  He had foot and knee specialists in the past,  but stopped seeing them.  Patient is not satisfied with his weight; he feels he is too heavy.   Notes that his weight has been the same for the last 5-10 years.   Family History Dad had alcoholism.  Died of a heart attack at age 51. Notes that his father had several health conditions.  Sister with spina bifida.   Paternal grandfather had stomach cancer.  No known history of depression or anxiety.  Mother and father both had HTN.   Surgical History Past Surgical History:  Procedure Laterality Date  . HERNIA REPAIR    . KNEE SURGERY     unknown which side  . LOOP RECORDER INSERTION N/A 04/30/2017   Procedure: Loop Recorder Insertion;  Surgeon: Deboraha Sprang, MD;  Location: [place] [facility];  Service: Cardiovascular;  Laterality: N/A;  . SKIN GRAFT      Past Medical History Notes that he got badly sick 6 years ago when he moved down here.  Feels his sickness occurred because he was stressed due to his recent retirement, recent move, the fact that he and his family were having the house built, having troubles with the bank and with the new house, and the fact that he was moving his  mother somewhere else.  - Recent Muscle Cramp in LLE Patient had a bad muscle cramp last week that is lingering in his left lower extremity.  Denies the area being hot, red, or swollen.  - History of Panic Attacks Went to see a therapist for panic attacks about 6 months ago.  Daylene Posey sent him on referral.  States that he was getting overly nervous before events and was having "dizzy attacks" where he felt he was going to faint.  Notes that this is inconvenient as he is a Theme park manager.  - Endocrinology - Hyperaldosteronism Hyperaldosteronism diagnosed about 4 years ago. Follows up with Dr. Posey Pronto.  - Cardiology - Hypertension & Cholesterol First diagnosed with high blood pressure about 20 years ago. Believes he was diagnosed in his 22's. Managed on losartan, amlodipine,  spironolactone.  First diagnosed with high cholesterol via a scan (MRI, CT), but his cholesterol levels "were always ok on labs."  Patient has a cardiologist; sees them for his high blood pressure, cholesterol, and heart disease.  He also has an irregular heartbeat.  Patient states that he has an intracardiac monitor.  He denies having a pacemaker and states that his monitor is not a defibrillator.  Dr. Caryl Comes put the monitor in.  Dr. Rockey Situ is his current cardiologist.  - Gout Flares - Managed only Acutely PRN Patient does not take chronic suppression. First gout flare was about five years ago.  Feels he has variable amounts of gout flares per year.  He believes that it has more to do with his diet than stress.  Hasn't had a major gout flare for about a year.    He experiences flares in his left big toe, but can experience them in either big toe.  - Allergist Takes albuterol and fluticasone for RAD in "the change of seasons."  Denies a history of thyroid problems, denies a history of diabetes.   Never been told he has any issues with his prostate or enlarged prostate, but states that he had prostatitis at one point and had to be put on medication for this.    Wt Readings from Last 3 Encounters:  07/25/18 205 lb 12.8 oz (93.4 kg)  11/27/17 196 lb (88.9 kg)  08/02/17 193 lb (87.5 kg)   BP Readings from Last 3 Encounters:  07/25/18 140/89  11/27/17 130/68  08/02/17 124/82   Pulse Readings from Last 3 Encounters:  07/25/18 71  11/27/17 64  08/02/17 (!) 59   BMI Readings from Last 3 Encounters:  07/25/18 28.70 kg/m  11/27/17 27.34 kg/m  08/02/17 26.92 kg/m    Patient Care Team    Relationship Specialty Notifications Start End  [name], DO PCP - General Family Medicine  07/25/18   [name], MD Consulting Physician Cardiology  07/25/18   [name], MD Referring Physician Endocrinology  07/25/18   Mariel Sleet Physician Assistant  Family Medicine  07/25/18     Patient Active Problem List   Diagnosis Date Noted  . Adjustment disorder with mixed anxiety and depressed mood 05/23/2017    Priority: High  . Apical variant hypertrophic cardiomyopathy ([place]) 01/18/2017    Priority: High  . Adenoma of left adrenal gland 09/19/2016    Priority: High  . Primary aldosteronism ([place]) 07/17/2013    Priority: High  . Coronary artery calcification seen on CAT scan 11/26/2017    Priority: Medium  . Panic disorder 07/08/2017    Priority: Medium  . Carotid atherosclerosis 12/29/2015  Priority: Medium  . Elevated lipoprotein(a) 07/17/2013    Priority: Medium  . Environmental and seasonal allergies 07/25/2018    Priority: Low  . Anxiety 05/23/2017    Priority: Low  . Ventricular tachycardia, non-sustained ([place]) 04/27/2017  . Tremor 03/19/2017  . Peripheral vertigo involving left ear 11/07/2016  . Vitamin D deficiency 10/02/2016  . Left hip pain 08/18/2016  . Psychophysiological insomnia 07/10/2016  . Sensorineural hearing loss (SNHL) of left ear with unrestricted hearing of right ear 05/29/2016  . Tinnitus of both ears 05/29/2016  . Metatarsal deformity 04/06/2016  . 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 Seasonal allergic rhinitis due to pollen 12/29/2015  . Cervical radiculopathy 11/11/2014  . Lipoma of neck 11/11/2014  . Abnormal EKG 07/17/2013  . Dizziness 07/17/2013  . Hypertension 09/12/2011     Past Medical History:  Diagnosis Date  . [place] coronary artery calcium score greater than 400    a. 12/2015 Cardiac CT: Ca2+ score of 726 (91st %'ile).  . Anxiety   . Apical variant hypertrophic cardiomyopathy ([place])    a. 01/2016 Echo: EF 55-60%, no rwma, mild AI, Ao root 72mm, mild MR, mod dil LA, mod TR, nl PASP; b. 12/2016 Cardiac MRI: EF 64%, sev apical hypertrophy up to 52mm in diastole. LGE in apical inf, lateral, and apical walls.  . Dilated aortic root ([place])    a. 01/2016 Ao root 53mm; b. 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  [name] Hyperaldosteronism ([place])   . Hypertension   . Syncope    a. s/p MDT Linq.     Past Surgical History:  Procedure Laterality Date  . HERNIA REPAIR    . KNEE SURGERY     unknown which side  . LOOP RECORDER INSERTION N/A 04/30/2017   Procedure: Loop Recorder Insertion;  Surgeon: Deboraha Sprang, MD;  Location: [place] [facility];  Service: Cardiovascular;  Laterality: N/A;  . SKIN GRAFT       Family History  Problem Relation Age of Onset  . Hypertension Mother   . Heart attack Father   . Hypertension Father   . Spina bifida Sister      Social History   Substance and Sexual Activity  Drug Use No     Social History   Substance and Sexual Activity  Alcohol Use Yes  . Alcohol/week: 1.0 standard drinks  . Types: 1 Glasses of wine per week   Comment: 2 glasses wine a week     Social History   Tobacco Use  Smoking Status Never Smoker  Smokeless Tobacco Never Used     Current Meds  Medication Sig  . albuterol (PROVENTIL HFA;VENTOLIN HFA) 108 (90 Base) MCG/ACT inhaler Inhale 2 puffs into the lungs every 6 (six) hours as needed for wheezing or shortness of breath.  [name] amLODipine (NORVASC) 10 MG tablet Take 10 mg by mouth daily.  . Cholecalciferol (VITAMIN D) 2000 units tablet Take 2,000 Units by mouth daily.  [name] escitalopram (LEXAPRO) 10 MG tablet Take 10 mg by mouth daily.  [name] losartan (COZAAR) 50 MG tablet Take 50 mg by mouth daily.  . OLOPATADINE HCL NA Place 2 sprays into the nose daily as needed (congestion).  . rosuvastatin (CRESTOR) 10 MG tablet TAKE 1 TABLET (10 MG TOTAL) BY MOUTH DAILY.  [name] spironolactone (ALDACTONE) 25 MG tablet Take 25 mg by mouth daily.  . vitamin C (ASCORBIC ACID) 500 MG tablet Take 500 mg by mouth 4 (four) times  a week.    Allergies: Patient has no known allergies.   Review of Systems  Constitutional: Negative for chills, diaphoresis, fever, malaise/fatigue and weight loss.  HENT:  Negative for congestion, sore throat and tinnitus.   Eyes: Negative for blurred vision, double vision and photophobia.  Respiratory: Negative for cough and wheezing.   Cardiovascular: Negative for chest pain and palpitations.  Gastrointestinal: Negative for blood in stool, diarrhea, nausea and vomiting.  Genitourinary: Negative for dysuria, frequency and urgency.       Nighttime urination.  Musculoskeletal: Negative for joint pain and myalgias.  Skin: Negative for itching and rash.  Neurological: Negative for dizziness, focal weakness, weakness and headaches.  Endo/Heme/Allergies: Negative for environmental allergies and polydipsia. Does not bruise/bleed easily.  Psychiatric/Behavioral: Negative for depression and memory loss. The patient is not nervous/anxious and does not have insomnia.      Objective:   Blood pressure 140/89, pulse 71, height 5\' 11"  (1.803 m), weight 205 lb 12.8 oz (93.4 kg), SpO2 99 %. Body mass index is 28.7 kg/m. General: Well Developed, well nourished, and in no acute distress.  Neuro: Alert and oriented x3, extra-ocular muscles intact, sensation grossly intact.  HEENT:[place]/AT, PERRLA, neck supple, No carotid bruits Skin: no gross rashes  Cardiac: Regular rate and rhythm Respiratory: Essentially clear to auscultation bilaterally. Not using accessory muscles, speaking in full sentences.  Abdominal: not grossly distended Musculoskeletal: Ambulates w/o diff, FROM * 4 ext.  Vasc: less 2 sec cap RF, warm and pink  Psych:  No HI/SI, judgement and insight good, Euthymic mood. Full Affect.    Recent Results (from the past 2160 hour(s))  CUP PACEART REMOTE DEVICE CHECK     Status: None   Collection Time: 05/10/18  8:36 PM  Result Value Ref Range   Date Time Interrogation Session [id number]    Pulse Generator Manufacturer MERM    Pulse Gen Model BHA19 Reveal LINQ    Pulse Gen Serial Number [id number]    Clinic Name [facility]    Implantable Pulse  Generator Type ICM/ILR    Implantable Pulse Generator Implant Date [id number]    Eval Rhythm SR   CUP PACEART REMOTE DEVICE CHECK     Status: None   Collection Time: 06/12/18  9:35 PM  Result Value Ref Range   Date Time Interrogation Session [id number]    Pulse Generator Manufacturer MERM    Pulse Gen Model GXQ11 Reveal LINQ    Pulse Gen Serial Number [id number]    Clinic Name [facility]    Implantable Pulse Generator Type ICM/ILR    Implantable Pulse Generator Implant Date [id number]   CUP PACEART REMOTE DEVICE CHECK     Status: None   Collection Time: 07/15/18 10:08 PM  Result Value Ref Range   Date Time Interrogation Session [id number]    Pulse Generator Manufacturer MERM    Pulse Gen Model SHF02 Reveal LINQ    Pulse Gen Serial Number [id number]    Clinic Name [place] [facility]    Implantable Pulse Generator Type ICM/ILR    Implantable Pulse Generator Implant Date [id number]    Eval Rhythm SR

## 2018-07-31 ENCOUNTER — Other Ambulatory Visit: Payer: Self-pay | Admitting: Internal Medicine

## 2018-08-01 ENCOUNTER — Encounter: Payer: Self-pay | Admitting: Family Medicine

## 2018-08-01 ENCOUNTER — Ambulatory Visit (INDEPENDENT_AMBULATORY_CARE_PROVIDER_SITE_OTHER): Payer: No Typology Code available for payment source | Admitting: Family Medicine

## 2018-08-01 VITALS — BP 136/88 | HR 66 | Ht 71.0 in | Wt 206.7 lb

## 2018-08-01 DIAGNOSIS — M62831 Muscle spasm of calf: Secondary | ICD-10-CM

## 2018-08-01 NOTE — Progress Notes (Signed)
Pt here for an acute care OV today   Impression and Recommendations:    1. Muscle spasm of calf     1. Lower Extremity Muscle Spasm - Bilateral - STRONGLY encouraged adequate hydration.  - Advised patient to drink plenty of water, one half of body weight in ounces of water per day.  Educated patient about critical importance of hydration.  Discussed that swelling and cramps will begin to be alleviated by proper hydration.  - Discussed with patient that when the body is inadequately hydrated, swelling and muscle spasms are exacerbated.  - Discussed and demonstrated use of ice cup massage on affected areas with patient today.  - Discussed and demonstrated techniques to stretch calves with patient today.  Advised patient to search for "how to stretch calf muscles" online for further tips.  - If pain worsens, patient knows to return to clinic for evaluation.  Reviewed that upcoming lab work can be obtained earlier than scheduled if symptoms persist, to assess for potential electrolyte abnormalities.    Education and routine counseling performed. Handouts provided  Gross side effects, risk and benefits, and alternatives of medications and treatment plan in general discussed with patient.  Patient is aware that all medications have potential side effects and we are unable to predict every side effect or drug-drug interaction that may occur.   Patient will call with any questions prior to using medication if they have concerns.    Expresses verbal understanding and consents to current therapy and treatment regimen.  No barriers to understanding were identified.  Red flag symptoms and signs discussed in detail.  Patient expressed understanding regarding what to do in case of emergency\urgent symptoms   Please see AVS handed out to patient at the end of our visit for further patient instructions/ counseling done pertaining to today's office visit.   Return if symptoms worsen or fail  to improve, for keep chronic f/up OV's.     Note:  This document was prepared occasionally using Dragon voice recognition software and may include unintentional dictation errors in addition to a scribe.  This document serves as a record of services personally performed by [name], DO. It was created on her behalf by Toni Amend, a trained medical scribe. The creation of this record is based on the scribe's personal observations and the provider's statements to them.   I have reviewed the above medical documentation for accuracy and completeness and I concur.  [name], D.O.     ---------------------------------------------------------------------------------------------------------------    Subjective:    CC:  Chief Complaint  Patient presents with  . Leg Swelling    HPI: Duane Warren is a 62 y.o. male who presents to [place] at [facility] today for issues as discussed below.  Patient with left leg cramp, two weeks ago, "very severe cramp in calf muscle."  States "was very very sore for a week or so."  Still sore, but not as sore.  And then his foot began to swell.  Feels that the top of his left foot is swollen.  Patient is concerned about having a blood clot.  He has never had a blood clot in the past.  Patient has not had any recent surgeries.  Has had cramps before, but not these same symptoms afterward.  Has been having cramps for years.  Doesn't drink enough water.  Intake varies each day.  Believes he probably drinks two bottles of water per day.  Drove  to [place] recently.  No recent travel on an airplane. Had the same cramp in his right leg.   No problems updated.   Wt Readings from Last 3 Encounters:  08/01/18 206 lb 11.2 oz (93.8 kg)  07/25/18 205 lb 12.8 oz (93.4 kg)  11/27/17 196 lb (88.9 kg)   BP Readings from Last 3 Encounters:  08/01/18 136/88  07/25/18 140/89  11/27/17 130/68   BMI Readings from  Last 3 Encounters:  08/01/18 28.83 kg/m  07/25/18 28.70 kg/m  11/27/17 27.34 kg/m     Patient Care Team    Relationship Specialty Notifications Start End  [name], DO PCP - General Family Medicine  07/25/18   [name], MD Consulting Physician Cardiology  07/25/18   [name], MD Referring Physician Endocrinology  07/25/18   Mariel Sleet Physician Assistant Family Medicine  07/25/18      Patient Active Problem List   Diagnosis Date Noted  . Adjustment disorder with mixed anxiety and depressed mood 05/23/2017    Priority: High  . Apical variant hypertrophic cardiomyopathy ([place]) 01/18/2017    Priority: High  . Adenoma of left adrenal gland 09/19/2016    Priority: High  . Primary aldosteronism ([place]) 07/17/2013    Priority: High  . Coronary artery calcification seen on CAT scan 11/26/2017    Priority: Medium  . Panic disorder 07/08/2017    Priority: Medium  . Carotid atherosclerosis 12/29/2015    Priority: Medium  . Elevated lipoprotein(a) 07/17/2013    Priority: Medium  . Environmental and seasonal allergies 07/25/2018    Priority: Low  . Anxiety 05/23/2017    Priority: Low  . Muscle spasm of calf 08/01/2018  . Ventricular tachycardia, non-sustained ([place]) 04/27/2017  . Tremor 03/19/2017  . Peripheral vertigo involving left ear 11/07/2016  . Vitamin D deficiency 10/02/2016  . Left hip pain 08/18/2016  . Psychophysiological insomnia 07/10/2016  . Sensorineural hearing loss (SNHL) of left ear with unrestricted hearing of right ear 05/29/2016  . Tinnitus of both ears 05/29/2016  . Metatarsal deformity 04/06/2016  . 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 Seasonal allergic rhinitis due to pollen 12/29/2015  . Cervical radiculopathy 11/11/2014  . Lipoma of neck 11/11/2014  . Abnormal EKG 07/17/2013  . Dizziness 07/17/2013  . Hypertension 09/12/2011      Past Medical History:  Diagnosis Date  . Agatston coronary artery  calcium score greater than 400    a. 12/2015 Cardiac CT: Ca2+ score of 726 (91st %'ile).  . Anxiety   . Apical variant hypertrophic cardiomyopathy ([place])    a. 01/2016 Echo: EF 55-60%, no rwma, mild AI, Ao root 53mm, mild MR, mod dil LA, mod TR, nl PASP; b. 12/2016 Cardiac MRI: EF 64%, sev apical hypertrophy up to 78mm in diastole. LGE in apical inf, lateral, and apical walls.  . Dilated aortic root ([place])    a. 01/2016 Ao root 17mm; b. 12/2016 Cardiac MRI: Mild aneurysmal dil of Asc Ao - 75mm.  [name] Hyperaldosteronism ([place])   . Hypertension   . Syncope    a. s/p MDT Linq.     Past Surgical History:  Procedure Laterality Date  . HERNIA REPAIR    . KNEE SURGERY     unknown which side  . LOOP RECORDER INSERTION N/A 04/30/2017   Procedure: Loop Recorder Insertion;  Surgeon: Deboraha Sprang, MD;  Location: [facility];  Service: Cardiovascular;  Laterality: N/A;  . SKIN GRAFT  Family History  Problem Relation Age of Onset  . Hypertension Mother   . Heart attack Father   . Hypertension Father   . Spina bifida Sister      Social History   Socioeconomic History  . Marital status: Married    Spouse name: Not on file  . Number of children: Not on file  . Years of education: Not on file  . Highest education level: Not on file  Occupational History  . 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 Financial resource strain: Not on file  . Food insecurity:    Worry: Not on file    Inability: Not on file  . Transportation needs:    Medical: Not on file    Non-medical: Not on file  Tobacco Use  . Smoking status: Never Smoker  . Smokeless tobacco: Never Used  Substance and Sexual Activity  . Alcohol use: Yes    Alcohol/week: 1.0 standard drinks    Types: 1 Glasses of wine per week    Comment: 2 glasses wine a week  . Drug use: No  . Sexual activity: Yes    Birth control/protection: None  Lifestyle  . Physical  activity:    Days per week: Not on file    Minutes per session: Not on file  . Stress: Not on file  Relationships  . Social connections:    Talks on phone: Not on file    Gets together: Not on file    Attends religious service: Not on file    Active member of club or organization: Not on file    Attends meetings of clubs or organizations: Not on file    Relationship status: Not on file  . Intimate partner violence:    Fear of current or ex partner: Not on file    Emotionally abused: Not on file    Physically abused: Not on file    Forced sexual activity: Not on file  Other Topics Concern  . Not on file  Social History Narrative   ** Merged History Encounter **         Current Meds  Medication Sig  . albuterol (PROVENTIL HFA;VENTOLIN HFA) 108 (90 Base) MCG/ACT inhaler Inhale 2 puffs into the lungs every 6 (six) hours as needed for wheezing or shortness of breath.  [name] amLODipine (NORVASC) 10 MG tablet Take 10 mg by mouth daily.  . Cholecalciferol (VITAMIN D) 2000 units tablet Take 2,000 Units by mouth daily.  [name] escitalopram (LEXAPRO) 10 MG tablet Take 10 mg by mouth daily.  [name] losartan (COZAAR) 50 MG tablet Take 50 mg by mouth daily.  . OLOPATADINE HCL NA Place 2 sprays into the nose daily as needed (congestion).  . rosuvastatin (CRESTOR) 10 MG tablet TAKE 1 TABLET (10 MG TOTAL) BY MOUTH DAILY.  [name] spironolactone (ALDACTONE) 25 MG tablet Take 25 mg by mouth daily.  . vitamin C (ASCORBIC ACID) 500 MG tablet Take 500 mg by mouth 4 (four) times a week.    Allergies:  No Known Allergies   Review of Systems: General:   Denies fever, chills, unexplained weight loss.  Optho/Auditory:   Denies visual changes, blurred vision/LOV Respiratory:   Denies wheeze, DOE more than baseline levels.   Cardiovascular:   Denies chest pain, palpitations, new onset peripheral edema  Gastrointestinal:   Denies nausea, vomiting, diarrhea, abd pain.  Genitourinary: Denies dysuria, freq/ urgency, flank  pain or discharge from genitals.  Endocrine:     Denies hot or cold intolerance, polyuria, polydipsia. Musculoskeletal:   Denies unexplained myalgias, joint swelling, unexplained arthralgias, gait problems.  Skin:  Denies new onset rash, suspicious lesions Neurological:     Denies dizziness, unexplained weakness, numbness  Psychiatric/Behavioral:   Denies mood changes, suicidal or homicidal ideations, hallucinations    Objective:   Blood pressure 136/88, pulse 66, height 5\' 11"  (1.803 m), weight 206 lb 11.2 oz (93.8 kg), SpO2 98 %. Body mass index is 28.83 kg/m. General:  Well Developed, well nourished, appropriate for stated age.  Neuro:  Alert and oriented,  extra-ocular muscles intact  HEENT:  Normocephalic, atraumatic, neck supple Skin:  no gross rash, warm, pink. Cardiac:  RRR, S1 S2 Respiratory:  ECTA B/L and A/P, Not using accessory muscles, speaking in full sentences- unlabored. Vascular:  Ext warm, no cyanosis apprec.; cap RF less 2 sec. Psych:  No HI/SI, judgement and insight good, Euthymic mood. Full Affect. Bilateral Lower Extremity:  Right calf in the gastrocnemius region is more enlarged than the left.  Palpable muscle spasms in the medial bellies of the gastrocs bilaterally, but worse on right than left.  No Homan's Sign, no Moses' Sign, capillary refill less than 2 seconds, distal pulses intact and equal bilaterally.

## 2018-08-01 NOTE — Patient Instructions (Signed)
Please realize, EXERCISE IS MEDICINE!  -  American Heart Association [facility]) guidelines for exercise : If you are in good health, without any medical conditions, you should engage in 150-300 minutes of moderate intensity aerobic activity per week.  This means you should be huffing and puffing throughout your workout.   Engaging in regular exercise will improve brain function and memory, as well as improve mood, boost immune system and help with weight management.  As well as the other, more well-known effects of exercise such as decreasing blood sugar levels, decreasing blood pressure,  and decreasing bad cholesterol levels/ increasing good cholesterol levels.    -  The AHA strongly endorses consumption of a diet that contains a variety of foods from all the food categories with an emphasis on fruits and vegetables; fat-free and low-fat dairy products; cereal and grain products; legumes and nuts; and fish, poultry, and/or extra lean meats.    Excessive food intake, especially of foods high in saturated and trans fats, sugar, and salt, should be avoided.    Adequate water intake of roughly 1/2 of your weight in pounds, should equal the ounces of water per day you should drink.  So for instance, if you're 200 pounds, that would be 100 ounces of water per day.      Muscle Cramps and Spasms Muscle cramps and spasms occur when a muscle or muscles tighten and you have no control over this tightening (involuntary muscle contraction). They are a common problem and can develop in any muscle. The most common place is in the calf muscles of the leg. Muscle cramps and muscle spasms are both involuntary muscle contractions, but there are some differences between the two:  Muscle cramps are painful. They come and go and may last a few seconds to 15 minutes. Muscle cramps are often more forceful and last longer than muscle spasms.  Muscle spasms may or may not be painful. They may also last just a few seconds or much  longer.  Certain medical conditions, such as diabetes or Parkinson disease, can make it more likely to develop cramps or spasms. However, cramps or spasms are usually not caused by a serious underlying problem. Common causes include:  Overexertion.  Overuse from repetitive motions, or doing the same thing over and over.  Remaining in a certain position for a long period of time.  Improper preparation, form, or technique while playing a sport or doing an activity.  Dehydration.  Injury.  Side effects of some medicines.  Abnormally low levels of the salts and ions in your blood (electrolytes), especially potassium and calcium. This could happen if you are taking water pills (diuretics) or if you are pregnant.  In many cases, the cause of muscle cramps or spasms is unknown. Follow these instructions at home:  Stay well hydrated. Drink enough fluid to keep your urine clear or pale yellow.  Try massaging, stretching, and relaxing the affected muscle.  If directed, apply heat to tight or tense muscles as often as told by your health care provider. Use the heat source that your health care provider recommends, such as a moist heat pack or a heating pad. ? Place a towel between your skin and the heat source. ? Leave the heat on for 20-30 minutes. ? Remove the heat if your skin turns bright red. This is especially important if you are unable to feel pain, heat, or cold. You may have a greater risk of getting burned.  If directed, put ice  on the affected area. This may help if you are sore or have pain after a cramp or spasm. ? Put ice in a plastic bag. ? Place a towel between your skin and the bag. ? Leavethe ice on for 20 minutes, 2-3 times a day.  Take over-the-counter and prescription medicines only as told by your health care provider.  Pay attention to any changes in your symptoms. Contact a health care provider if:  Your cramps or spasms get more severe or happen more  often.  Your cramps or spasms do not improve over time. This information is not intended to replace advice given to you by your health care provider. Make sure you discuss any questions you have with your health care provider. Document Released: 03/24/2002 Document Revised: 11/03/2015 Document Reviewed: 07/06/2015 Elsevier Interactive Patient Education  2018 [employer].

## 2018-08-19 ENCOUNTER — Ambulatory Visit (INDEPENDENT_AMBULATORY_CARE_PROVIDER_SITE_OTHER): Payer: No Typology Code available for payment source | Admitting: *Deleted

## 2018-08-19 DIAGNOSIS — R55 Syncope and collapse: Secondary | ICD-10-CM

## 2018-08-19 NOTE — Progress Notes (Signed)
Carelink Summary Report / Loop Recorder 

## 2018-08-27 ENCOUNTER — Encounter: Payer: Self-pay | Admitting: Internal Medicine

## 2018-08-27 ENCOUNTER — Ambulatory Visit (INDEPENDENT_AMBULATORY_CARE_PROVIDER_SITE_OTHER): Payer: No Typology Code available for payment source | Admitting: Internal Medicine

## 2018-08-27 VITALS — BP 140/84 | HR 66 | Ht 71.0 in | Wt 204.5 lb

## 2018-08-27 DIAGNOSIS — R55 Syncope and collapse: Secondary | ICD-10-CM

## 2018-08-27 DIAGNOSIS — Z959 Presence of cardiac and vascular implant and graft, unspecified: Secondary | ICD-10-CM | POA: Diagnosis not present

## 2018-08-27 DIAGNOSIS — Z79899 Other long term (current) drug therapy: Secondary | ICD-10-CM

## 2018-08-27 DIAGNOSIS — R002 Palpitations: Secondary | ICD-10-CM

## 2018-08-27 DIAGNOSIS — I422 Other hypertrophic cardiomyopathy: Secondary | ICD-10-CM | POA: Diagnosis not present

## 2018-08-27 NOTE — Patient Instructions (Addendum)
Medication Instructions:  - Your physician recommends that you continue on your current medications as directed. Please refer to the Current Medication list given to you today.  If you need a refill on your cardiac medications before your next appointment, please call your pharmacy.   Lab work: - Your physician recommends that you have lab work today: BMP  If you have labs (blood work) drawn today and your tests are completely normal, you will receive your results only by: [name] MyChart Message (if you have MyChart) OR . A paper copy in the mail If you have any lab test that is abnormal or we need to change your treatment, we will call you to review the results.  Testing/Procedures: - none ordered  Follow-Up: At [facility], you and your health needs are our priority.  As part of our continuing mission to provide you with exceptional heart care, we have created designated Provider Care Teams.  These Care Teams include your primary Cardiologist (physician) and Advanced Practice Providers (APPs -  Physician Assistants and Nurse Practitioners) who all work together to provide you with the care you need, when you need it. . You will need a follow up appointment in 6 months with Dr. Caryl Comes.  Please call our office 2 months in advance to schedule this appointment.   Remote monitoring is used to monitor your Pacemaker of ICD from home. This monitoring reduces the number of office visits required to check your device to one time per year. It allows [place] to keep an eye on the functioning of your device to ensure it is working properly. You are scheduled for a device check from home on 09/19/18. You may send your transmission at any time that day. If you have a wireless device, the transmission will be sent automatically. After your physician reviews your transmission, you will receive a postcard with your next transmission date.  Any Other Special Instructions Will Be Listed Below (If Applicable). -  N/A

## 2018-08-27 NOTE — Progress Notes (Signed)
Patient Care Team: [name], DO as PCP - General (Family Medicine) [name], MD as Consulting Physician (Cardiology) [name], MD as Referring Physician (Endocrinology) [name], [place] as Physician Assistant (Family Medicine)   HPI  Duane Warren is a 62 y.o. 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 He underwent loop recorder implantation for assessment of the latter.  No interval palpitations or syncope.  Nonsustained ventricular tachycardia, one recorded, was unassociated with lightheadedness or palpitations.  Dyspnea with bending over and dyspnea last year walking to a [place] basketball game.  Not otherwise.    He is now on an anxiolytic.  Records and Results Reviewed   Date Cr K  6/18 1.21 4.5          Past Medical History:  Diagnosis Date  . Agatston coronary artery calcium score greater than 400    a. 12/2015 Cardiac CT: Ca2+ score of 726 (91st %'ile).  . Anxiety   . Apical variant hypertrophic cardiomyopathy ([place])    a. 01/2016 Echo: EF 55-60%, no rwma, mild AI, Ao root 58mm, mild MR, mod dil LA, mod TR, nl PASP; b. 12/2016 Cardiac MRI: EF 64%, sev apical hypertrophy up to 11mm in diastole. LGE in apical inf, lateral, and apical walls.  . Dilated aortic root ([place])    a. 01/2016 Ao root 81mm; b. 12/2016 Cardiac MRI: Mild aneurysmal dil of Asc Ao - 66mm.  [name] Hyperaldosteronism ([place])   . Hypertension   . Syncope    a. s/p MDT Linq.    Past Surgical History:  Procedure Laterality Date  . HERNIA REPAIR    . KNEE SURGERY     unknown which side  . LOOP RECORDER INSERTION N/A 04/30/2017   Procedure: Loop Recorder Insertion;  Surgeon: Deboraha Sprang, MD;  Location: [facility];  Service: Cardiovascular;  Laterality: N/A;  . SKIN GRAFT      Current Outpatient Medications  Medication Sig Dispense Refill  . albuterol (PROVENTIL HFA;VENTOLIN HFA) 108 (90 Base)  MCG/ACT inhaler Inhale 2 puffs into the lungs every 6 (six) hours as needed for wheezing or shortness of breath.    [name] amLODipine (NORVASC) 10 MG tablet Take 10 mg by mouth daily.  0  . Cholecalciferol (VITAMIN D) 2000 units tablet Take 2,000 Units by mouth daily.    [name] losartan (COZAAR) 50 MG tablet Take 50 mg by mouth daily.  0  . OLOPATADINE HCL NA Place 2 sprays into the nose daily as needed (congestion).    . rosuvastatin (CRESTOR) 10 MG tablet TAKE 1 TABLET (10 MG TOTAL) BY MOUTH DAILY. 90 tablet 1  . spironolactone (ALDACTONE) 25 MG tablet Take 25 mg by mouth daily.  6  . vitamin C (ASCORBIC ACID) 500 MG tablet Take 500 mg by mouth 4 (four) times a week.     No current facility-administered medications for this visit.     No Known Allergies    Review of Systems negative except from HPI and PMH  Physical Exam BP 140/84 (BP Location: Left Arm, Patient Position: Sitting, Cuff Size: Normal)   Pulse 66   Ht 5\' 11"  (1.803 m)   Wt 204 lb 8 oz (92.8 kg)   BMI 28.52 kg/m  Well developed and nourished in no acute distress HENT normal Neck supple with JVP-flat Clear Regular rate and rhythm, no murmurs or gallops Abd-soft with active BS No Clubbing cyanosis edema Skin-warm and dry  A & Oriented  Grossly normal sensory and motor function    ECG sinus at 66 Intervals 15/09/44 Striking repolarization abnormalities 1 2 L3 V2-V6  Assessment and  Plan  Hypertrophic cardiomyopathy- apical  Palpitations  Dyspnea on exertion  Bendopnea  Hypertension  Syncope  Implantable loop recorder  High Risk Medication Surveillance  VT Nonsustained     The patient had nonsustained ventricular tachycardia.  When he was called from the office, he had no associated symptoms apart from having been vomiting  Dyspnea on exertion is infrequent.  He may benefit from alternative antihypertensive therapy with either a non-dihydropyridine calcium blocker or beta-blocker.  Bendopnea  associated with volume overload; modestly obese.  Encouraged weight loss  Blood pressure well controlled;  Need to check BMET on aldactone   Nonsustained ventricular tachycardia has no significant impact on risk stratification in this gentleman is over 59 years old.  We will have his MRI reviewed to make sure there is no evidence of apical aneurysm but was described as spade shaped

## 2018-09-03 ENCOUNTER — Telehealth: Payer: Self-pay

## 2018-09-03 ENCOUNTER — Other Ambulatory Visit: Payer: Self-pay | Admitting: Cardiovascular Disease

## 2018-09-03 MED ORDER — LOSARTAN POTASSIUM 50 MG PO TABS: 50.0000 mg | ORAL_TABLET | Freq: Every day | ORAL | 1 refills | Status: DC

## 2018-09-03 NOTE — Telephone Encounter (Signed)
RX sent to [address]. At patient's request.

## 2018-09-03 NOTE — Telephone Encounter (Signed)
°*  STAT* If patient is at the pharmacy, call can be transferred to refill team.   1. Which medications need to be refilled? (please list name of each medication and dose if known) losartan 50 MG   2. Which pharmacy/location (including street and city if local pharmacy) is medication to be sent to? [facility] on [address] in [place] - [facility] [address] [facility] on [facility]  3. Do they need a 30 day or 90 day supply?

## 2018-09-07 LAB — CUP PACEART REMOTE DEVICE CHECK
Implantable Pulse Generator Implant Date: 20180716
MDC IDC SESS DTM: 20191102221012

## 2018-09-09 ENCOUNTER — Other Ambulatory Visit: Payer: No Typology Code available for payment source

## 2018-09-09 ENCOUNTER — Telehealth: Payer: Self-pay | Admitting: Cardiovascular Disease

## 2018-09-09 DIAGNOSIS — I10 Essential (primary) hypertension: Secondary | ICD-10-CM

## 2018-09-09 DIAGNOSIS — Z0000 Encounter for general adult medical examination without abnormal findings: Secondary | ICD-10-CM

## 2018-09-09 DIAGNOSIS — E559 Vitamin D deficiency, unspecified: Secondary | ICD-10-CM

## 2018-09-09 MED ORDER — LOSARTAN POTASSIUM 25 MG PO TABS: 50.0000 mg | ORAL_TABLET | Freq: Every day | ORAL | 1 refills | Status: DC

## 2018-09-09 NOTE — Telephone Encounter (Signed)
Call from patient, [facility] due to [facility] back order. [facility], per technician, Losartan available in 25 mg and in 100 mg strength.  Call to patient, he reports that he has been out of medication since Saturday. He denies taking BP at home but reports that he is feeling at baseline with no complaints.Pt preference is to take 2 tablets of the 25 mg strength.  Order changed in mychart to: Losartan 2 tablets (50 mg total) by mouth daily. Pt agreed to take daily blood pressures.  Telephone encounter routed to Dr. Rockey Situ.

## 2018-09-09 NOTE — Telephone Encounter (Signed)
Please call to discuss Losartan. Pt states his pharmacy is unable to get the mg that he needs.

## 2018-09-10 LAB — CBC WITH DIFFERENTIAL/PLATELET
BASOS ABS: 0 10*3/uL (ref 0.0–0.2)
Basos: 1 %
EOS (ABSOLUTE): 0.1 10*3/uL (ref 0.0–0.4)
Eos: 1 %
HEMATOCRIT: 47.9 % (ref 37.5–51.0)
HEMOGLOBIN: 16.8 g/dL (ref 13.0–17.7)
Immature Grans (Abs): 0 10*3/uL (ref 0.0–0.1)
Immature Granulocytes: 0 %
LYMPHS: 24 %
Lymphocytes Absolute: 1.8 10*3/uL (ref 0.7–3.1)
MCH: 29.6 pg (ref 26.6–33.0)
MCHC: 35.1 g/dL (ref 31.5–35.7)
MCV: 84 fL (ref 79–97)
MONOS ABS: 0.8 10*3/uL (ref 0.1–0.9)
Monocytes: 10 %
NEUTROS PCT: 64 %
Neutrophils Absolute: 4.7 10*3/uL (ref 1.4–7.0)
Platelets: 188 10*3/uL (ref 150–450)
RBC: 5.68 x10E6/uL (ref 4.14–5.80)
RDW: 12.7 % (ref 12.3–15.4)
WBC: 7.4 10*3/uL (ref 3.4–10.8)

## 2018-09-10 LAB — COMPREHENSIVE METABOLIC PANEL
ALK PHOS: 73 IU/L (ref 39–117)
ALT: 22 IU/L (ref 0–44)
AST: 20 IU/L (ref 0–40)
Albumin/Globulin Ratio: 2 (ref 1.2–2.2)
Albumin: 3.9 g/dL (ref 3.6–4.8)
BUN/Creatinine Ratio: 11 (ref 10–24)
BUN: 14 mg/dL (ref 8–27)
Bilirubin Total: 0.8 mg/dL (ref 0.0–1.2)
CALCIUM: 9.5 mg/dL (ref 8.6–10.2)
CO2: 24 mmol/L (ref 20–29)
CREATININE: 1.28 mg/dL — AB (ref 0.76–1.27)
Chloride: 106 mmol/L (ref 96–106)
GFR calc Af Amer: 68 mL/min/{1.73_m2} (ref >59)
GFR, EST NON AFRICAN AMERICAN: 59 mL/min/{1.73_m2} — AB (ref >59)
GLOBULIN, TOTAL: 2 g/dL (ref 1.5–4.5)
GLUCOSE: 115 mg/dL — AB (ref 65–99)
Potassium: 4 mmol/L (ref 3.5–5.2)
SODIUM: 146 mmol/L — AB (ref 134–144)
Total Protein: 5.9 g/dL — ABNORMAL LOW (ref 6.0–8.5)

## 2018-09-10 LAB — LIPID PANEL
CHOL/HDL RATIO: 3 ratio (ref 0.0–5.0)
Cholesterol, Total: 148 mg/dL (ref 100–199)
HDL: 50 mg/dL (ref >39)
LDL CALC: 85 mg/dL (ref 0–99)
Triglycerides: 67 mg/dL (ref 0–149)
VLDL Cholesterol Cal: 13 mg/dL (ref 5–40)

## 2018-09-10 LAB — HEMOGLOBIN A1C
ESTIMATED AVERAGE GLUCOSE: 120 mg/dL
HEMOGLOBIN A1C: 5.8 % — AB (ref 4.8–5.6)

## 2018-09-10 LAB — T4, FREE: Free T4: 1.17 ng/dL (ref 0.82–1.77)

## 2018-09-10 LAB — TSH: TSH: 2.7 u[IU]/mL (ref 0.450–4.500)

## 2018-09-10 LAB — VITAMIN D 25 HYDROXY (VIT D DEFICIENCY, FRACTURES): VIT D 25 HYDROXY: 35.9 ng/mL (ref 30.0–100.0)

## 2018-09-16 ENCOUNTER — Encounter: Payer: Self-pay | Admitting: Family Medicine

## 2018-09-16 ENCOUNTER — Ambulatory Visit (INDEPENDENT_AMBULATORY_CARE_PROVIDER_SITE_OTHER): Payer: No Typology Code available for payment source | Admitting: Family Medicine

## 2018-09-16 VITALS — BP 146/86 | HR 58 | Temp 98.3°F | Ht 71.0 in | Wt 201.0 lb

## 2018-09-16 DIAGNOSIS — E785 Hyperlipidemia, unspecified: Secondary | ICD-10-CM

## 2018-09-16 DIAGNOSIS — R7302 Impaired glucose tolerance (oral): Secondary | ICD-10-CM

## 2018-09-16 DIAGNOSIS — I10 Essential (primary) hypertension: Secondary | ICD-10-CM

## 2018-09-16 DIAGNOSIS — I6523 Occlusion and stenosis of bilateral carotid arteries: Secondary | ICD-10-CM | POA: Diagnosis not present

## 2018-09-16 DIAGNOSIS — I2510 Atherosclerotic heart disease of native coronary artery without angina pectoris: Secondary | ICD-10-CM

## 2018-09-16 MED ORDER — ROSUVASTATIN CALCIUM 20 MG PO TABS: 20.0000 mg | ORAL_TABLET | Freq: Every day | ORAL | 1 refills | Status: DC

## 2018-09-16 NOTE — Progress Notes (Signed)
Assessment and plan:  1. Hypertension, unspecified type   2. Glucose intolerance (impaired glucose tolerance)- new onset   3. Hyperlipidemia, unspecified hyperlipidemia type   4. Atherosclerosis of both carotid arteries   5. Coronary artery calcification seen on CAT scan     1. Reviewed recent lab work (09/09/2018) in depth with patient today.  All lab work within normal limits unless otherwise noted.  - Discussed the importance of adequate hydration, physical activity, and controlling blood pressure and blood sugar to preserve kidney and overall organ health.  - Avoid ibuprofen, Advil, and aleve, and other nephrotoxic substances.  Discussed that given patient's liver function, tylenol is preferred for aches and pains.  2. Prediabetes - A1c at 5.8. - Re-check in 4 months. - If A1c is 6.0 or more, medications will be indicated.  - Counseled patient on prevention of disease and discussed dietary and lifestyle modifications as first line.  Importance of low carb/ketogenic diet discussed with patient in addition to regular exercise.   - Educated patient about the dangers of diabetes in a patient with high blood pressure and atherosclerotic disease of the heart and carotids.  - Handout provided today.  3. Vitamin D Insufficiency - Increase from 2000 IU's to 5000 IU's daily.  4. Hyperlipidemia - Lipid Panel, Elevated lipoprotein(a) - Patient currently managed on Crestor. - Reviewed that patient may need a change in medication due to his high ASCVD Risk score.    - Increase Crestor from 10 mg to 20 mg.  See med list below. - Encouraged patient to be consistent with his medications.  - Patient tolerating meds well without complication.  Denies S-E - Discussed importance of adequate hydration to minimize S-E.  LDL = 85 HDL = 50 Triglycerides = 67  - Dietary changes such as low saturated & trans fat and low carb/  ketogenic diets discussed with patient.  Encouraged regular exercise and weight loss when appropriate.   - Extensive education provided and educational handouts given at patient's desire. - Re-check 4 months, liver enzymes and fasting lipid profile.  The 10-year ASCVD risk score Mikey Bussing DC Brooke Bonito., et al., 2013) is: 18.8%   Values used to calculate the score:     Age: 62 years     Sex: Male     Is Non-Hispanic African American: Yes     Diabetic: No     Tobacco smoker: No     Systolic Blood Pressure: 937 mmHg     Is BP treated: Yes     HDL Cholesterol: 50 mg/dL     Total Cholesterol: 148 mg/dL  5. Atherosclerosis of both carotid arteries, coronary artery calcification seen on CAT scan - Reviewed need for patient to continue to follow up with Dr. Rockey Situ of cardiology.  - Reviewed need to control blood pressure and cholesterol to prevent an event from occurring.  6. Hypertension, unspecified type - Discussed goal blood pressure as consistently below 140/90 - Blood pressure elevated on intake today.  - Ambulatory BP monitoring encouraged. Keep log and bring in next OV. - Per patient, plans to check his blood pressure closely while at home.  - Lifestyle changes such as dash diet and engaging in a regular exercise program discussed with patient.  Educational handouts provided.  - Patient will call about his blood pressure measurements at home.  - Discussed that if BP is suboptimally controlled, patient should review his medications with his cardiologist in near future and ask if he  can increase his dose of losartan to 100.   7. BMI Counseling Explained to patient what BMI refers to, and what it means medically.    Told patient to think about it as a "medical risk stratification measurement" and how increasing BMI is associated with increasing risk/ or worsening state of various diseases such as hypertension, hyperlipidemia, diabetes, premature OA, depression etc.  American Heart Association  guidelines for healthy diet, basically Mediterranean diet, and exercise guidelines of 30 minutes 5 days per week or more discussed in detail.  Health counseling performed.  All questions answered.  8. Lifestyle & Preventative Health Maintenance - Advised patient to continue working toward exercising to improve overall mental, physical, and emotional health.    - Reviewed the "spokes of the wheel" of mood and health management.  Stressed the importance of ongoing prudent habits, including regular exercise, appropriate sleep hygiene, healthful dietary habits, and prayer/meditation to relax.  - Encouraged patient to engage in daily physical activity, especially a formal exercise routine.  Recommended that the patient eventually strive for at least 150 minutes of moderate cardiovascular activity per week according to guidelines established by the [facility].   - Healthy dietary habits encouraged, including low-carb, and high amounts of lean protein in diet.   - Patient should also consume adequate amounts of water.   Education and routine counseling performed. Handouts provided.   Meds ordered this encounter  Medications  . rosuvastatin (CRESTOR) 20 MG tablet    Sig: Take 1 tablet (20 mg total) by mouth at bedtime.    Dispense:  90 tablet    Refill:  1    Medications Discontinued During This Encounter  Medication Reason  . vitamin C (ASCORBIC ACID) 500 MG tablet Patient Preference  . rosuvastatin (CRESTOR) 10 MG tablet Reorder      Return needs updated photo, for 2) 52mo FBW then OV with me 3 d later or so.   Anticipatory guidance and routine counseling done re: condition, txmnt options and need for follow up. All questions of patient's were answered.   Gross side effects, risk and benefits, and alternatives of medications discussed with patient.  Patient is aware that all medications have potential side effects and we are unable to predict every sideeffect or drug-drug interaction that  may occur.  Expresses verbal understanding and consents to current therapy plan and treatment regiment.  Please see AVS handed out to patient at the end of our visit for additional patient instructions/ counseling done pertaining to today's office visit.  Note:  This document was prepared using Dragon voice recognition software and may include unintentional dictation errors.   This document serves as a record of services personally performed by DMellody Dance DO. It was created on her behalf by KToni Amend a trained medical scribe. The creation of this record is based on the scribe's personal observations and the provider's statements to them.   I have reviewed the above medical documentation for accuracy and completeness and I concur.  DMellody Dance DO 09/25/2018 6:33 AM      -----------------------------------------------------------------------------------------------------  Subjective:   CC:   Duane LOUXis a 62y.o. male [facility] of recent bloodwork that was done in addition to f/up on chronic conditions we are managing for pt.  1. All recent blood work that we ordered was reviewed with patient today.  Patient was counseled on all abnormalities and we discussed dietary and lifestyle  changes that could help those values (also medications when appropriate).  Extensive health counseling performed and all patient's concerns/ questions were addressed.  See labs below and also plan for more details of these abnormalities  Patient saw Dr. Caryl Comes about his heart monitor.  He was told that his blood pressure was fine at that time.  1. HTN HPI:  -  His blood pressure has been suboptimally controlled at home.  Pt is checking it at home, but not too regularly.  At home, ranges 128-140.  Feels his blood pressure hangs around 140.  Patient admits that he does not do enough cardio exercise on a  daily basis.  - Patient reports good compliance with blood pressure medications  - Denies medication S-E   - Smoking Status noted   - He denies new onset of: chest pain, exercise intolerance, shortness of breath, dizziness, visual changes, headache, lower extremity swelling or claudication.   Last 3 blood pressure readings in our office are as follows: BP Readings from Last 3 Encounters:  09/16/18 (!) 146/86  08/27/18 140/84  08/01/18 136/88    Filed Weights   09/16/18 1048  Weight: 201 lb (91.2 kg)   2. 62 y.o. male here for cholesterol follow-up.   States that he has been missing some doses of his Crestor.  For a while, he didn't take his Crestor, "not purposely."  - Denies medication S-E   - Smoking Status noted   - He denies new onset of: chest pain, exercise intolerance, shortness of breath, dizziness, visual changes, headache, lower extremity swelling or claudication.   Denies myalgias  The cholesterol last visit was:  Lab Results  Component Value Date   CHOL 148 09/09/2018   HDL 50 09/09/2018   LDLCALC 85 09/09/2018   TRIG 67 09/09/2018   CHOLHDL 3.0 09/09/2018    Hepatic Function Latest Ref Rng & Units 09/09/2018 12/22/2016 05/19/2016  Total Protein 6.0 - 8.5 g/dL 5.9(L) 6.6 6.6  Albumin 3.6 - 4.8 g/dL 3.9 4.3 3.9  AST 0 - 40 IU/L 20 30 22   ALT 0 - 44 IU/L 22 33 23  Alk Phosphatase 39 - 117 IU/L 73 62 65  Total Bilirubin 0.0 - 1.2 mg/dL 0.8 0.5 0.6  Bilirubin, Direct 0.00 - 0.40 mg/dL - 0.17 0.1    Wt Readings from Last 3 Encounters:  09/16/18 201 lb (91.2 kg)  08/27/18 204 lb 8 oz (92.8 kg)  08/01/18 206 lb 11.2 oz (93.8 kg)   BP Readings from Last 3 Encounters:  09/16/18 (!) 146/86  08/27/18 140/84  08/01/18 136/88   Pulse Readings from Last 3 Encounters:  09/16/18 (!) 58  08/27/18 66  08/01/18 66   BMI Readings from Last 3 Encounters:  09/16/18 28.03 kg/m  08/27/18 28.52 kg/m  08/01/18 28.83 kg/m     Patient Care Team     Relationship Specialty Notifications Start End  [name], DO PCP - General Family Medicine  07/25/18   [name], MD Consulting Physician Cardiology  07/25/18   [name], MD Referring Physician Endocrinology  07/25/18   Elisabeth Cara, PA-C Physician Assistant Family Medicine  07/25/18     Full medical history updated and reviewed in the office today  Patient Active Problem List   Diagnosis Date Noted  . Adjustment disorder with mixed anxiety and depressed mood 05/23/2017    Priority: High  . Apical variant hypertrophic cardiomyopathy ([place]) 01/18/2017    Priority: High  . Adenoma of left adrenal gland  09/19/2016    Priority: High  . 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 Coronary artery calcification seen on CAT scan 11/26/2017    Priority: Medium  . Panic disorder 07/08/2017    Priority: Medium  . Carotid atherosclerosis 12/29/2015    Priority: Medium  . Elevated lipoprotein(a) 07/17/2013    Priority: Medium  . Environmental and seasonal allergies 07/25/2018    Priority: Low  . Anxiety 05/23/2017    Priority: Low  . Muscle spasm of calf 08/01/2018  . Ventricular tachycardia, non-sustained ([place]) 04/27/2017  . Tremor 03/19/2017  . Peripheral vertigo involving left ear 11/07/2016  . Vitamin D deficiency 10/02/2016  . Left hip pain 08/18/2016  . Psychophysiological insomnia 07/10/2016  . Sensorineural hearing loss (SNHL) of left ear with unrestricted hearing of right ear 05/29/2016  . Tinnitus of both ears 05/29/2016  . Metatarsal deformity 04/06/2016  . 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 Seasonal allergic rhinitis due to pollen 12/29/2015  . Cervical radiculopathy 11/11/2014  . Lipoma of neck 11/11/2014  . Abnormal EKG 07/17/2013  . Dizziness 07/17/2013  . Hypertension 09/12/2011    Past Medical History:  Diagnosis Date  . [place] coronary artery calcium score greater than 400    a. 12/2015 Cardiac CT:  Ca2+ score of 726 (91st %'ile).  . Anxiety   . Apical variant hypertrophic cardiomyopathy ([place])    a. 01/2016 Echo: EF 55-60%, no rwma, mild AI, Ao root 62m, mild MR, mod dil LA, mod TR, nl PASP; b. 12/2016 Cardiac MRI: EF 64%, sev apical hypertrophy up to 235min diastole. LGE in apical inf, lateral, and apical walls.  . Dilated aortic root ([facility]   a. 01/2016 Ao root 3949mb. 12/2016 Cardiac MRI: Mild aneurysmal dil of Asc Ao - 17m11m. HyMarland Kitcheneraldosteronism ([facility])[place]. Hypertension   . Syncope    a. s/p MDT Linq.    Past Surgical History:  Procedure Laterality Date  . HERNIA REPAIR    . KNEE SURGERY     unknown which side  . LOOP RECORDER INSERTION N/A 04/30/2017   Procedure: Loop Recorder Insertion;  Surgeon: KleiDeboraha Sprang;  [facility];  Service: Cardiovascular;  Laterality: N/A;  . SKIN GRAFT      Social History   Tobacco Use  . Smoking status: Never Smoker  . Smokeless tobacco: Never Used  Substance Use Topics  . Alcohol use: Yes    Alcohol/week: 1.0 standard drinks    Types: 1 Glasses of wine per week    Comment: 2 glasses wine a week    Family Hx: Family History  Problem Relation Age of Onset  . Hypertension Mother   . Heart attack Father   . Hypertension Father   . Spina bifida Sister      Medications: Current Outpatient Medications  Medication Sig Dispense Refill  . albuterol (PROVENTIL HFA;VENTOLIN HFA) 108 (90 Base) MCG/ACT inhaler Inhale 2 puffs into the lungs every 6 (six) hours as needed for wheezing or shortness of breath.    . amMarland KitchenODipine (NORVASC) 10 MG tablet Take 10 mg by mouth daily.  0  . Cholecalciferol (VITAMIN D) 2000 units tablet Take 5,000 Units by mouth daily.    . loMarland Kitchenartan (COZAAR) 25 MG tablet Take 2 tablets (50 mg total) by mouth daily. 90 tablet 1  . OLOPATADINE HCL NA Place 2 sprays into the nose daily as needed (congestion).    . rosuvastatin (CRESTOR)  20 MG tablet Take 1 tablet (20 mg total) by mouth at bedtime. 90  tablet 1  . spironolactone (ALDACTONE) 25 MG tablet Take 25 mg by mouth daily.  6   No current facility-administered medications for this visit.     Allergies:  No Known Allergies   Review of Systems: General:   No F/C, wt loss Pulm:   No DIB, SOB, pleuritic chest pain Card:  No CP, palpitations Abd:  No n/v/d or pain Ext:  No inc edema from baseline  Objective:  Blood pressure (!) 146/86, pulse (!) 58, temperature 98.3 F (36.8 C), height 5' 11"  (1.803 m), weight 201 lb (91.2 kg), SpO2 99 %. Body mass index is 28.03 kg/m. Gen:   Well NAD, A and O *3 HEENT:    [place]/AT, EOMI,  MMM Lungs:   Normal work of breathing. CTA B/L, no Wh, rhonchi Heart:   RRR, S1, S2 WNL's, no MRG Abd:   No gross distention Exts:    warm, pink,  Brisk capillary refill, warm and well perfused.  Psych:    No HI/SI, judgement and insight good, Euthymic mood. Full Affect.   Recent Results (from the past 2160 hour(s))  CUP PACEART REMOTE DEVICE CHECK     Status: None   Collection Time: 07/15/18 10:08 PM  Result Value Ref Range   Date Time Interrogation Session [id number]    Pulse Generator Manufacturer MERM    Pulse Gen Model TWS56 Reveal LINQ    Pulse Gen Serial Number [id number]    Clinic Name [facility]    Implantable Pulse Generator Type ICM/ILR    Implantable Pulse Generator Implant Date [id number]    Eval Rhythm SR   CUP PACEART REMOTE DEVICE CHECK     Status: None   Collection Time: 08/19/18 11:10 PM  Result Value Ref Range   Date Time Interrogation Session [id number]    Pulse Generator Manufacturer MERM    Pulse Gen Model PRF16 Reveal LINQ    Pulse Gen Serial Number [id number]    Clinic Name [facility]    Implantable Pulse Generator Type ICM/ILR    Implantable Pulse Generator Implant Date [id number]   VITAMIN D 25 Hydroxy (Vit-D Deficiency, Fractures)     Status: None   Collection Time: 09/09/18  9:32 AM  Result Value Ref Range   Vit D, 25-Hydroxy 35.9 30.0 - 100.0  ng/mL    Comment: Vitamin D deficiency has been defined by the [place] practice guideline as a level of serum 25-OH vitamin D less than 20 ng/mL (1,2). The Endocrine Society went on to further define vitamin D insufficiency as a level between 21 and 29 ng/mL (2). 1. IOM (Institute of Medicine). 2010. Dietary reference    intakes for calcium and D. [place]: The    [employer]. 2. Holick MF, Binkley Grimes, Bischoff-Ferrari HA, et al.    Evaluation, treatment, and prevention of vitamin D    deficiency: an Endocrine Society clinical practice    guideline. JCEM. 2011 Jul; 96(7):1911-30.   TSH     Status: None   Collection Time: 09/09/18  9:32 AM  Result Value Ref Range   TSH 2.700 0.450 - 4.500 uIU/mL  T4, free     Status: None   Collection Time: 09/09/18  9:32 AM  Result Value Ref Range   Free T4 1.17 0.82 - 1.77 ng/dL  Lipid panel     Status: None   Collection Time: 09/09/18  9:32 AM  Result Value Ref Range   Cholesterol, Total 148 100 - 199 mg/dL   Triglycerides 67 0 - 149 mg/dL   HDL 50 >39 mg/dL   VLDL Cholesterol Cal 13 5 - 40 mg/dL   LDL Calculated 85 0 - 99 mg/dL   Chol/HDL Ratio 3.0 0.0 - 5.0 ratio    Comment:                                   T. Chol/HDL Ratio                                             Men  Women                               1/2 Avg.Risk  3.4    3.3                                   Avg.Risk  5.0    4.4                                2X Avg.Risk  9.6    7.1                                3X Avg.Risk 23.4   11.0   CBC with Differential/Platelet     Status: None   Collection Time: 09/09/18  9:32 AM  Result Value Ref Range   WBC 7.4 3.4 - 10.8 x10E3/uL   RBC 5.68 4.14 - 5.80 x10E6/uL   Hemoglobin 16.8 13.0 - 17.7 g/dL   Hematocrit 47.9 37.5 - 51.0 %   MCV 84 79 - 97 fL   MCH 29.6 26.6 - 33.0 pg   MCHC 35.1 31.5 - 35.7 g/dL   RDW 12.7 12.3 - 15.4 %   Platelets 188 150 - 450 x10E3/uL    Neutrophils 64 Not Estab. %   Lymphs 24 Not Estab. %   Monocytes 10 Not Estab. %   Eos 1 Not Estab. %   Basos 1 Not Estab. %   Neutrophils Absolute 4.7 1.4 - 7.0 x10E3/uL   Lymphocytes Absolute 1.8 0.7 - 3.1 x10E3/uL   Monocytes Absolute 0.8 0.1 - 0.9 x10E3/uL   EOS (ABSOLUTE) 0.1 0.0 - 0.4 x10E3/uL   Basophils Absolute 0.0 0.0 - 0.2 x10E3/uL   Immature Granulocytes 0 Not Estab. %   Immature Grans (Abs) 0.0 0.0 - 0.1 x10E3/uL  Comprehensive metabolic panel     Status: Abnormal   Collection Time: 09/09/18  9:32 AM  Result Value Ref Range   Glucose 115 (H) 65 - 99 mg/dL   BUN 14 8 - 27 mg/dL   Creatinine, Ser 1.28 (H) 0.76 - 1.27 mg/dL   GFR calc non Af Amer 59 (L) >59 mL/min/1.73   GFR calc Af Amer 68 >59 mL/min/1.73   BUN/Creatinine Ratio 11 10 - 24   Sodium 146 (H) 134 - 144 mmol/L   Potassium 4.0 3.5 - 5.2 mmol/L   Chloride 106 96 - 106 mmol/L  CO2 24 20 - 29 mmol/L   Calcium 9.5 8.6 - 10.2 mg/dL   Total Protein 5.9 (L) 6.0 - 8.5 g/dL   Albumin 3.9 3.6 - 4.8 g/dL   Globulin, Total 2.0 1.5 - 4.5 g/dL   Albumin/Globulin Ratio 2.0 1.2 - 2.2   Bilirubin Total 0.8 0.0 - 1.2 mg/dL   Alkaline Phosphatase 73 39 - 117 IU/L   AST 20 0 - 40 IU/L   ALT 22 0 - 44 IU/L  Hemoglobin A1c     Status: Abnormal   Collection Time: 09/09/18  9:32 AM  Result Value Ref Range   Hgb A1c MFr Bld 5.8 (H) 4.8 - 5.6 %    Comment:          Prediabetes: 5.7 - 6.4          Diabetes: >6.4          Glycemic control for adults with diabetes: <7.0    Est. average glucose Bld gHb Est-mCnc 120 mg/dL

## 2018-09-16 NOTE — Patient Instructions (Addendum)
Also, in 4 months we will recheck your A1c, vitamin D (since you are increasing from 2000 vitamin D daily to 5000 daily) your fasting lipid profile, and also CMP to recheck your kidney and liver function-just prior to your follow-up office visit with me so please make 2 appointments one for fasting blood work then 3 days or so later for OV with me    Please make sure you are checking your blood pressure at different times per day for the next two weeks, and let me know what your recordings are.  If not consistently less than 140/90, then we will likely need to increase your losartan, in consultation with your cardiologist.      Risk factors for prediabetes and type 2 diabetes  Researchers don't fully understand why some people develop prediabetes and type 2 diabetes and others don't.  It's clear that certain factors increase the risk, however, including:  Weight. The more fatty tissue you have, the more resistant your cells become to insulin.  Inactivity. The less active you are, the greater your risk. Physical activity helps you control your weight, uses up glucose as energy and makes your cells more sensitive to insulin.  Family history. Your risk increases if a parent or sibling has type 2 diabetes.  Race. Although it's unclear why, people of certain races -- including blacks, Hispanics, American Indians and Asian-Americans -- are at higher risk.  Age. Your risk increases as you get older. This may be because you tend to exercise less, lose muscle mass and gain weight as you age. But type 2 diabetes is also increasing dramatically among children, adolescents and younger adults.  Gestational diabetes. If you developed gestational diabetes when you were pregnant, your risk of developing prediabetes and type 2 diabetes later increases. If you gave birth to a baby weighing more than 9 pounds (4 kilograms), you're also at risk of type 2 diabetes.  Polycystic ovary syndrome. For women, having  polycystic ovary syndrome -- a common condition characterized by irregular menstrual periods, excess hair growth and obesity -- increases the risk of diabetes.  High blood pressure. Having blood pressure over 140/90 millimeters of mercury (mm Hg) is linked to an increased risk of type 2 diabetes.  Abnormal cholesterol and triglyceride levels. If you have low levels of high-density lipoprotein (HDL), or "good," cholesterol, your risk of type 2 diabetes is higher. Triglycerides are another type of fat carried in the blood. People with high levels of triglycerides have an increased risk of type 2 diabetes. Your doctor can let you know what your cholesterol and triglyceride levels are.  A good guide to good carbs: The glycemic index ---If you have diabetes, or at risk for diabetes, you know all too well that when you eat carbohydrates, your blood sugar goes up. The total amount of carbs you consume at a meal or in a snack mostly determines what your blood sugar will do. But the food itself also plays a role. A serving of white rice has almost the same effect as eating pure table sugar -- a quick, high spike in blood sugar. A serving of lentils has a slower, smaller effect.  ---Picking good sources of carbs can help you control your blood sugar and your weight. Even if you don't have diabetes, eating healthier carbohydrate-rich foods can help ward off a host of chronic conditions, from heart disease to various cancers to, well, diabetes.  ---One way to choose foods is with the glycemic index (GI). This tool  measures how much a food boosts blood sugar.  The glycemic index rates the effect of a specific amount of a food on blood sugar compared with the same amount of pure glucose. A food with a glycemic index of 28 boosts blood sugar only 28% as much as pure glucose. One with a GI of 95 acts like pure glucose.    High glycemic foods result in a quick spike in insulin and blood sugar (also known as blood  glucose).  Low glycemic foods have a slower, smaller effect- these are healthier for you.   Using the glycemic index Using the glycemic index is easy: choose foods in the low GI category instead of those in the high GI category (see below), and go easy on those in between. Low glycemic index (GI of 55 or less): Most fruits and vegetables, beans, minimally processed grains, pasta, low-fat dairy foods, and nuts.  Moderate glycemic index (GI 56 to 69): White and sweet potatoes, corn, white rice, couscous, breakfast cereals such as Cream of Wheat and Mini Wheats.  High glycemic index (GI of 70 or higher): White bread, rice cakes, most crackers, bagels, cakes, doughnuts, croissants, most packaged breakfast cereals. You can see the values for 100 commons foods and get links to more at www.health.CheapToothpicks.si.  Swaps for lowering glycemic index  Instead of this high-glycemic index food Eat this lower-glycemic index food  White rice Brown rice or converted rice  Instant oatmeal Steel-cut oats  Cornflakes Bran flakes  Baked potato Pasta, bulgur  White bread Whole-grain bread  Corn Peas or leafy greens       Prediabetes Eating Plan  Prediabetes--also called impaired glucose tolerance or impaired fasting glucose--is a condition that causes blood sugar (blood glucose) levels to be higher than normal. Following a healthy diet can help to keep prediabetes under control. It can also help to lower the risk of type 2 diabetes and heart disease, which are increased in people who have prediabetes. Along with regular exercise, a healthy diet:  Promotes weight loss.  Helps to control blood sugar levels.  Helps to improve the way that the body uses insulin.   WHAT DO I NEED TO KNOW ABOUT THIS EATING PLAN?   Use the glycemic index (GI) to plan your meals. The index tells you how quickly a food will raise your blood sugar. Choose low-GI foods. These foods take a longer time to raise blood  sugar.  Pay close attention to the amount of carbohydrates in the food that you eat. Carbohydrates increase blood sugar levels.  Keep track of how many calories you take in. Eating the right amount of calories will help you to achieve a healthy weight. Losing about 7 percent of your starting weight can help to prevent type 2 diabetes.  You may want to follow a Mediterranean diet. This diet includes a lot of vegetables, lean meats or fish, whole grains, fruits, and healthy oils and fats.   WHAT FOODS CAN I EAT?  Grains Whole grains, such as whole-wheat or whole-grain breads, crackers, cereals, and pasta. Unsweetened oatmeal. Bulgur. Barley. Quinoa. Brown rice. Corn or whole-wheat flour tortillas or taco shells. Vegetables Lettuce. Spinach. Peas. Beets. Cauliflower. Cabbage. Broccoli. Carrots. Tomatoes. Squash. Eggplant. Herbs. Peppers. Onions. Cucumbers. Brussels sprouts. Fruits Berries. Bananas. Apples. Oranges. Grapes. Papaya. Mango. Pomegranate. Kiwi. Grapefruit. Cherries. Meats and Other Protein Sources Seafood. Lean meats, such as chicken and [place] or lean cuts of pork and beef. Tofu. Eggs. Nuts. Beans. Dairy Low-fat or fat-free dairy products,  such as yogurt, cottage cheese, and cheese. Beverages Water. Tea. Coffee. Sugar-free or diet soda. Seltzer water. Milk. Milk alternatives, such as soy or almond milk. Condiments Mustard. Relish. Low-fat, low-sugar ketchup. Low-fat, low-sugar barbecue sauce. Low-fat or fat-free mayonnaise. Sweets and Desserts Sugar-free or low-fat pudding. Sugar-free or low-fat ice cream and other frozen treats. Fats and Oils Avocado. Walnuts. Olive oil. The items listed above may not be a complete list of recommended foods or beverages. Contact your dietitian for more options.    WHAT FOODS ARE NOT RECOMMENDED?  Grains Refined white flour and flour products, such as bread, pasta, snack foods, and cereals. Beverages Sweetened drinks, such as sweet iced  tea and soda. Sweets and Desserts Baked goods, such as cake, cupcakes, pastries, cookies, and cheesecake. The items listed above may not be a complete list of foods and beverages to avoid. Contact your dietitian for more information.   This information is not intended to replace advice given to you by your health care provider. Make sure you discuss any questions you have with your health care provider.   Document Released: 02/16/2015 Document Reviewed: 02/16/2015 Elsevier Interactive Patient Education 2016 [place] for a Low Cholesterol, Low Saturated Fat Diet   Fats - Limit total intake of fats and oils. - Avoid butter, stick margarine, shortening, lard, palm and coconut oils. - Limit mayonnaise, salad dressings, gravies and sauces, unless they are homemade with low-fat ingredients. - Limit chocolate. - Choose low-fat and nonfat products, such as low-fat mayonnaise, low-fat or non-hydrogenated peanut butter, low-fat or fat-free salad dressings and nonfat gravy. - Use vegetable oil, such as canola or olive oil. - Look for margarine that does not contain trans fatty acids. - Use nuts in moderate amounts. - Read ingredient labels carefully to determine both amount and type of fat present in foods. Limit saturated and trans fats! - Avoid high-fat processed and convenience foods.  Meats and Meat Alternatives - Choose fish, chicken, [place] and lean meats. - Use dried beans, peas, lentils and tofu. - Limit egg yolks to three to four per week. - If you eat red meat, limit to no more than three servings per week and choose loin or round cuts. - Avoid fatty meats, such as bacon, sausage, franks, luncheon meats and ribs. - Avoid all organ meats, including liver.  Dairy - Choose nonfat or low-fat milk, yogurt and cottage cheese. - Most cheeses are high in fat. Choose cheeses made from non-fat milk, such as mozzarella and ricotta cheese. - Choose light or fat-free cream  cheese and sour cream. - Avoid cream and sauces made with cream.  Fruits and Vegetables - Eat a wide variety of fruits and vegetables. - Use lemon juice, vinegar or "mist" olive oil on vegetables. - Avoid adding sauces, fat or oil to vegetables.  Breads, Cereals and Grains - Choose whole-grain breads, cereals, pastas and rice. - Avoid high-fat snack foods, such as granola, cookies, pies, pastries, doughnuts and croissants.  Cooking Tips - Avoid deep fried foods. - Trim visible fat off meats and remove skin from poultry before cooking. - Bake, broil, boil, poach or roast poultry, fish and lean meats. - Drain and discard fat that drains out of meat as you cook it. - Add little or no fat to foods. - Use vegetable oil sprays to grease pans for cooking or baking. - Steam vegetables. - Use herbs or no-oil marinades to flavor foods.

## 2018-09-19 ENCOUNTER — Ambulatory Visit (INDEPENDENT_AMBULATORY_CARE_PROVIDER_SITE_OTHER): Payer: No Typology Code available for payment source

## 2018-09-19 DIAGNOSIS — R55 Syncope and collapse: Secondary | ICD-10-CM | POA: Diagnosis not present

## 2018-09-20 NOTE — Progress Notes (Signed)
Carelink Summary Report / Loop Recorder 

## 2018-09-25 DIAGNOSIS — R7302 Impaired glucose tolerance (oral): Secondary | ICD-10-CM | POA: Insufficient documentation

## 2018-10-17 NOTE — Progress Notes (Deleted)
Subjective:    Patient ID: Duane Warren, male    DOB: 03-01-1956, 63 y.o.   MRN: [id number]  HPI:  Duane Warren presents with   Patient Care Team    Relationship Specialty Notifications Start End  Thomasene Lot, DO PCP - General Family Medicine  07/25/18   Antonieta Iba, MD Consulting Physician Cardiology  07/25/18   Izell [place], MD Referring Physician Endocrinology  07/25/18   Gillian Scarce Physician Assistant Family Medicine  07/25/18     Patient Active Problem List   Diagnosis Date Noted  . Glucose intolerance (impaired glucose tolerance)- new onset 09/25/2018  . Muscle spasm of calf 08/01/2018  . Environmental and seasonal allergies 07/25/2018  . Coronary artery calcification seen on CAT scan 11/26/2017  . Panic disorder 07/08/2017  . Adjustment disorder with mixed anxiety and depressed mood 05/23/2017  . Anxiety 05/23/2017  . Ventricular tachycardia, non-sustained (HCC) 04/27/2017  . Tremor 03/19/2017  . Apical variant hypertrophic cardiomyopathy (HCC) 01/18/2017  . Peripheral vertigo involving left ear 11/07/2016  . Vitamin D deficiency 10/02/2016  . Adenoma of left adrenal gland 09/19/2016  . Left hip pain 08/18/2016  . Psychophysiological insomnia 07/10/2016  . Sensorineural hearing loss (SNHL) of left ear with unrestricted hearing of right ear 05/29/2016  . Tinnitus of both ears 05/29/2016  . Metatarsal deformity 04/06/2016  . 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 Carotid atherosclerosis 12/29/2015  . Seasonal allergic rhinitis due to pollen 12/29/2015  . Cervical radiculopathy 11/11/2014  . Lipoma of neck 11/11/2014  . Primary aldosteronism (HCC) 07/17/2013  . Abnormal EKG 07/17/2013  . Dizziness 07/17/2013  . 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 Hypertension 09/12/2011     Past Medical History:  Diagnosis Date  . Agatston coronary artery calcium score greater than 400    a. 12/2015 Cardiac CT: Ca2+ score of 726  (91st %'ile).  . Anxiety   . Apical variant hypertrophic cardiomyopathy (HCC)    a. 01/2016 Echo: EF 55-60%, no rwma, mild AI, Ao root 39mm, mild MR, mod dil LA, mod TR, nl PASP; b. 12/2016 Cardiac MRI: EF 64%, sev apical hypertrophy up to 22mm in diastole. LGE in apical inf, lateral, and apical walls.  . Dilated aortic root (HCC)    a. 01/2016 Ao root 39mm; b. 12/2016 Cardiac MRI: Mild aneurysmal dil of Asc Ao - 42mm.  [name] Hyperaldosteronism (HCC)   . Hypertension   . Syncope    a. s/p MDT Linq.     Past Surgical History:  Procedure Laterality Date  . HERNIA REPAIR    . KNEE SURGERY     unknown which side  . LOOP RECORDER INSERTION N/A 04/30/2017   Procedure: Loop Recorder Insertion;  Surgeon: [name], MD;  Location: [facility] INVASIVE CV LAB;  Service: Cardiovascular;  Laterality: N/A;  . SKIN GRAFT       Family History  Problem Relation Age of Onset  . Hypertension Mother   . Heart attack Father   . Hypertension Father   . Spina bifida Sister      Social History   Substance and Sexual Activity  Drug Use No     Social History   Substance and Sexual Activity  Alcohol Use Yes  . Alcohol/week: 1.0 standard drinks  . Types: 1 Glasses of wine per week   Comment: 2 glasses wine a week     Social History   Tobacco Use  Smoking Status Never Smoker  Smokeless Tobacco Never Used  Outpatient Encounter Medications as of 10/18/2018  Medication Sig  . albuterol (PROVENTIL HFA;VENTOLIN HFA) 108 (90 Base) MCG/ACT inhaler Inhale 2 puffs into the lungs every 6 (six) hours as needed for wheezing or shortness of breath.  [name] amLODipine (NORVASC) 10 MG tablet Take 10 mg by mouth daily.  . Cholecalciferol (VITAMIN D) 2000 units tablet Take 5,000 Units by mouth daily.  [name] losartan (COZAAR) 25 MG tablet Take 2 tablets (50 mg total) by mouth daily.  . OLOPATADINE HCL NA Place 2 sprays into the nose daily as needed (congestion).  . rosuvastatin (CRESTOR) 20 MG tablet Take 1 tablet  (20 mg total) by mouth at bedtime.  [name] spironolactone (ALDACTONE) 25 MG tablet Take 25 mg by mouth daily.   No facility-administered encounter medications on file as of 10/18/2018.     Allergies: Patient has no known allergies.  There is no height or weight on file to calculate BMI.  There were no vitals taken for this visit.     Review of Systems     Objective:   Physical Exam        Assessment & Plan:  No diagnosis found.  No problem-specific Assessment & Plan notes found for this encounter.    FOLLOW-UP:  No follow-ups on file.

## 2018-10-18 ENCOUNTER — Ambulatory Visit: Payer: No Typology Code available for payment source | Admitting: Adult Health

## 2018-10-22 ENCOUNTER — Ambulatory Visit (INDEPENDENT_AMBULATORY_CARE_PROVIDER_SITE_OTHER): Payer: No Typology Code available for payment source

## 2018-10-22 DIAGNOSIS — R55 Syncope and collapse: Secondary | ICD-10-CM

## 2018-10-23 LAB — CUP PACEART REMOTE DEVICE CHECK
Date Time Interrogation Session: 20200107230624
Implantable Pulse Generator Implant Date: 20180716

## 2018-10-24 NOTE — Progress Notes (Signed)
Carelink Summary Report / Loop Recorder 

## 2018-11-02 LAB — CUP PACEART REMOTE DEVICE CHECK
Date Time Interrogation Session: 20191205223705
Implantable Pulse Generator Implant Date: 20180716

## 2018-11-13 ENCOUNTER — Encounter: Payer: Self-pay | Admitting: Family Medicine

## 2018-11-13 ENCOUNTER — Ambulatory Visit (INDEPENDENT_AMBULATORY_CARE_PROVIDER_SITE_OTHER): Payer: No Typology Code available for payment source | Admitting: Family Medicine

## 2018-11-13 VITALS — BP 140/85 | HR 73 | Temp 97.5°F | Ht 71.0 in | Wt 204.7 lb

## 2018-11-13 DIAGNOSIS — J069 Acute upper respiratory infection, unspecified: Secondary | ICD-10-CM

## 2018-11-13 DIAGNOSIS — J029 Acute pharyngitis, unspecified: Secondary | ICD-10-CM | POA: Diagnosis not present

## 2018-11-13 DIAGNOSIS — J324 Chronic pansinusitis: Secondary | ICD-10-CM | POA: Diagnosis not present

## 2018-11-13 DIAGNOSIS — R0982 Postnasal drip: Secondary | ICD-10-CM | POA: Diagnosis not present

## 2018-11-13 MED ORDER — PREDNISONE 20 MG PO TABS: ORAL_TABLET | ORAL | 0 refills | Status: DC

## 2018-11-13 NOTE — Progress Notes (Signed)
Acute Care Office visit  Assessment and plan:  1. Chronic pansinusitis   2. PND (post-nasal drip)   3. Upper respiratory tract infection, unspecified type-  recently had ABX   4. Pharyngitis, unspecified etiology      Rhino-sinusitis, post-nasal drip, recent URI:    - Viral vs Allergic vs Bacterial causes for pt's symptoms reveiwed.    - Supportive care and various OTC medications discussed in addition to any prescribed. - Call or RTC if new symptoms, or if no improvement or worse over next several days.   -Discussed that I will prescribe a 5 day prescription of Prednisone for use. Patient has taken Prednisone in the past with great tolerance.  -Advised for the patient to follow up if his symptoms become worse and last more than a week or if he has a fever of 100.5 or more on two separate occasions or one sided facial pain. - Will consider ABX if sx continue past 10 days and worsening if not already given.   Cerumen impaction:  -Advised that the patient would need to use 0.5 rubbing alcohol and 0.5 hydrogen peroxide a couple times a week to aid with cerumen buildup.     Meds ordered this encounter  Medications  . predniSONE (DELTASONE) 20 MG tablet    Sig: Take 3 pills a day for 2 days, 2 pills a day for 2 days, 1 pill a day for 2 days then one half pill a day for 2 days then off    Dispense:  14 tablet    Refill:  0    Gross side effects, risk and benefits, and alternatives of medications discussed with patient.  Patient is aware that all medications have potential side effects and we are unable to predict every sideeffect or drug-drug interaction that may occur.  Expresses verbal understanding and consents to current therapy plan and treatment regiment.   Education and routine counseling performed. Handouts provided.  Anticipatory guidance and routine counseling done re: condition, txmnt options and need for follow up. All questions of patient's were answered.  Return  for F-up of current med issues as previously d/c pt.  Please see AVS handed out to patient at the end of our visit for additional patient instructions/ counseling done pertaining to today's office visit.  Note:  This document was partially repared using Dragon voice recognition software and may include unintentional dictation errors.  This document serves as a record of services personally performed by [name], DO. It was created on her behalf by Steva Colder, a trained medical scribe. The creation of this record is based on the scribe's personal observations and the provider's statements to them.   I have reviewed the above medical documentation for accuracy and completeness and I concur.  [name], DO 11/14/2018 9:02 PM      Subjective:    Chief Complaint  Patient presents with  . Cough    HPI:  Pt presents with Sx for 7 days   C/o: new onset sore throat, dry cough, rhinorrhea, and sinus congestion. When he coughs he feels a soreness to his throat and he notes that he has an abnormal feeling to his throat (soreness) when he isn't coughing. His cough and sore throat isn't worse in the morning or the night.   Denies: trouble swallowing, fever, chills, ear pain, and any other symptoms.     For symptoms patient has tried:  He has used flonase PRN. He hasn't tried any medications  for his symptoms.   Overall getting:  Better with his right ear pain symptoms, however, he is concerned that his symptoms have recurred.  He went to [facility] on 10/17/2018 and was diagnosed with a right ear infection given Amoxil and informed to follow up with his PCP.     Patient Care Team    Relationship Specialty Notifications Start End  [name], DO PCP - General Family Medicine  07/25/18   [name], MD Consulting Physician Cardiology  07/25/18   [name], MD Referring Physician Endocrinology  07/25/18   Elisabeth Cara, PA-C Physician Assistant  Family Medicine  07/25/18     Past medical history, Surgical history, Family history reviewed and noted below, Social history, Allergies, and Medications have been entered into the medical record, reviewed and changed as needed.   No Known Allergies  Review of Systems: - see above HPI for pertinent positives General:   No F/C, wt loss Pulm:   No DIB, pleuritic chest pain Card:  No CP, palpitations Abd:  No n/v/d or pain Ext:  No inc edema from baseline   Objective:   Blood pressure 140/85, pulse 73, temperature (!) 97.5 F (36.4 C), height 5\' 11"  (1.803 m), weight 204 lb 11.2 oz (92.9 kg), SpO2 98 %. Body mass index is 28.55 kg/m. General: Well Developed, well nourished, appropriate for stated age.  Neuro: Alert and oriented x3, extra-ocular muscles intact, sensation grossly intact.  HEENT: Normocephalic, atraumatic, pupils equal round reactive to light, neck supple, no masses, no painful lymphadenopathy, TM's intact B/L, no acute findings. Nares: Erythematous and edematous with clear discharge. OP- clear, erythematous, No TTP sinuses Skin: Warm and dry, no gross rash. Cardiac: RRR, S1 S2,  no murmurs rubs or gallops.  Respiratory: ECTA B/L and A/P, Not using accessory muscles, speaking in full sentences- unlabored. Vascular:  No gross lower ext edema, cap RF less 2 sec. Psych: No HI/SI, judgement and insight good, Euthymic mood. Full Affect.

## 2018-11-13 NOTE — Patient Instructions (Signed)
Please take your Flonase after sinus rinses are done in the morning with 1 spray each nostril and then do the sinus rinse again in the evening with 1 spray Flonase each nostril again.  Please continue this as long as you feel congestion in your sinuses or runny nose sore throat etc.     Pharyngitis   Pharyngitis is redness, pain, and swelling (inflammation) of the throat (pharynx). It is a very common cause of sore throat. Pharyngitis can be caused by a bacteria, but it is usually caused by a virus. Most cases of pharyngitis get better on their own without treatment. What are the causes? This condition may be caused by:  Infection by viruses (viral). Viral pharyngitis spreads from person to person (is contagious) through coughing, sneezing, and sharing of personal items or utensils such as cups, forks, spoons, and toothbrushes.  Infection by bacteria (bacterial). Bacterial pharyngitis may be spread by touching the nose or face after coming in contact with the bacteria, or through more intimate contact, such as kissing.  Allergies. Allergies can cause buildup of mucus in the throat (post-nasal drip), leading to inflammation and irritation. Allergies can also cause blocked nasal passages, forcing breathing through the mouth, which dries and irritates the throat. What increases the risk? You are more likely to develop this condition if:  You are 34-81 years old.  You are exposed to crowded environments such as daycare, school, or dormitory living.  You live in a cold climate.  You have a weakened disease-fighting (immune) system. What are the signs or symptoms? Symptoms of this condition vary by the cause (viral, bacterial, or allergies) and can include:  Sore throat.  Fatigue.  Low-grade fever.  Headache.  Joint pain and muscle aches.  Skin rashes.  Swollen glands in the throat (lymph nodes).  Plaque-like film on the throat or tonsils. This is often a symptom of bacterial  pharyngitis.  Vomiting.  Stuffy nose (nasal congestion).  Cough.  Red, itchy eyes (conjunctivitis).  Loss of appetite. How is this diagnosed? This condition is often diagnosed based on your medical history and a physical exam. Your health care provider will ask you questions about your illness and your symptoms. A swab of your throat may be done to check for bacteria (rapid strep test). Other lab tests may also be done, depending on the suspected cause, but these are rare. How is this treated? This condition usually gets better in 3-4 days without medicine. Bacterial pharyngitis may be treated with antibiotic medicines. Follow these instructions at home:  Take over-the-counter and prescription medicines only as told by your health care provider. ? If you were prescribed an antibiotic medicine, take it as told by your health care provider. Do not stop taking the antibiotic even if you start to feel better. ? Do not give children aspirin because of the association with Reye syndrome.  Drink enough water and fluids to keep your urine clear or pale yellow.  Get a lot of rest.  Gargle with a salt-water mixture 3-4 times a day or as needed. To make a salt-water mixture, completely dissolve -1 tsp of salt in 1 cup of warm water.  If your health care provider approves, you may use throat lozenges or sprays to soothe your throat. Contact a health care provider if:  You have large, tender lumps in your neck.  You have a rash.  You cough up green, yellow-brown, or bloody spit. Get help right away if:  Your neck becomes stiff.  You drool or are unable to swallow liquids.  You cannot drink or take medicines without vomiting.  You have severe pain that does not go away, even after you take medicine.  You have trouble breathing, and it is not caused by a stuffy nose.  You have new pain and swelling in your joints such as the knees, ankles, wrists, or elbows. Summary  Pharyngitis  is redness, pain, and swelling (inflammation) of the throat (pharynx).  While pharyngitis can be caused by a bacteria, the most common causes are viral.  Most cases of pharyngitis get better on their own without treatment.  Bacterial pharyngitis is treated with antibiotic medicines. This information is not intended to replace advice given to you by your health care provider. Make sure you discuss any questions you have with your health care provider. Document Released: 10/02/2005 Document Revised: 11/07/2016 Document Reviewed: 11/07/2016 Elsevier Interactive Patient Education  2019 [employer].

## 2018-11-24 ENCOUNTER — Other Ambulatory Visit: Payer: Self-pay | Admitting: Cardiovascular Disease

## 2018-11-25 ENCOUNTER — Ambulatory Visit (INDEPENDENT_AMBULATORY_CARE_PROVIDER_SITE_OTHER): Payer: No Typology Code available for payment source

## 2018-11-25 DIAGNOSIS — R55 Syncope and collapse: Secondary | ICD-10-CM

## 2018-11-25 LAB — CUP PACEART REMOTE DEVICE CHECK
Date Time Interrogation Session: 20200209230909
Implantable Pulse Generator Implant Date: 20180716

## 2018-11-25 NOTE — Telephone Encounter (Signed)
Refill sent for Losartan 25 mg take 2 tablets by mouth daily sent to [facility] in [place].

## 2018-11-29 ENCOUNTER — Telehealth: Payer: Self-pay

## 2018-11-29 NOTE — Telephone Encounter (Signed)
Left message for patient regarding disconnected monitor.  

## 2018-12-05 NOTE — Progress Notes (Signed)
Carelink Summary Report / Loop Recorder 

## 2018-12-27 ENCOUNTER — Ambulatory Visit (INDEPENDENT_AMBULATORY_CARE_PROVIDER_SITE_OTHER): Payer: No Typology Code available for payment source | Admitting: *Deleted

## 2018-12-27 DIAGNOSIS — R55 Syncope and collapse: Secondary | ICD-10-CM

## 2018-12-28 LAB — CUP PACEART REMOTE DEVICE CHECK
Date Time Interrogation Session: 20200313234023
Implantable Pulse Generator Implant Date: 20180716

## 2019-01-03 NOTE — Progress Notes (Signed)
Carelink Summary Report / Loop Recorder 

## 2019-01-06 ENCOUNTER — Ambulatory Visit (INDEPENDENT_AMBULATORY_CARE_PROVIDER_SITE_OTHER): Payer: No Typology Code available for payment source | Admitting: Adult Health

## 2019-01-06 ENCOUNTER — Other Ambulatory Visit: Payer: Self-pay

## 2019-01-06 ENCOUNTER — Encounter: Payer: Self-pay | Admitting: Adult Health

## 2019-01-06 VITALS — BP 136/69 | HR 63 | Temp 98.6°F | Ht 71.0 in | Wt 203.5 lb

## 2019-01-06 DIAGNOSIS — H6690 Otitis media, unspecified, unspecified ear: Secondary | ICD-10-CM | POA: Diagnosis not present

## 2019-01-06 DIAGNOSIS — D170 Benign lipomatous neoplasm of skin and subcutaneous tissue of head, face and neck: Secondary | ICD-10-CM | POA: Diagnosis not present

## 2019-01-06 MED ORDER — DOXYCYCLINE HYCLATE 100 MG PO TABS: 100.0000 mg | ORAL_TABLET | Freq: Two times a day (BID) | ORAL | 0 refills | Status: DC

## 2019-01-06 NOTE — Patient Instructions (Addendum)
Otitis Media, Adult  Otitis media occurs when there is inflammation and fluid in the middle ear. Your middle ear is a part of the ear that contains bones for hearing as well as air that helps send sounds to your brain. What are the causes? This condition is caused by a blockage in the eustachian tube. This tube drains fluid from the ear to the back of the nose (nasopharynx). A blockage in this tube can be caused by an object or by swelling (edema) in the tube. Problems that can cause a blockage include:  A cold or other upper respiratory infection.  Allergies.  An irritant, such as tobacco smoke.  Enlarged adenoids. The adenoids are areas of soft tissue located high in the back of the throat, behind the nose and the roof of the mouth.  A mass in the nasopharynx.  Damage to the ear caused by pressure changes (barotrauma). What are the signs or symptoms? Symptoms of this condition include:  Ear pain.  A fever.  Decreased hearing.  A headache.  Tiredness (lethargy).  Fluid leaking from the ear.  Ringing in the ear. How is this diagnosed? This condition is diagnosed with a physical exam. During the exam your health care provider will use an instrument called an otoscope to look into your ear and check for redness, swelling, and fluid. He or she will also ask about your symptoms. Your health care provider may also order tests, such as:  A test to check the movement of the eardrum (pneumatic otoscopy). This test is done by squeezing a small amount of air into the ear.  A test that changes air pressure in the middle ear to check how well the eardrum moves and whether the eustachian tube is working (tympanogram). How is this treated? This condition usually goes away on its own within 3-5 days. But if the condition is caused by a bacteria infection and does not go away own its own, or keeps coming back, your health care provider may:  Prescribe antibiotic medicines to treat the  infection.  Prescribe or recommend medicines to control pain. Follow these instructions at home:  Take over-the-counter and prescription medicines only as told by your health care provider.  If you were prescribed an antibiotic medicine, take it as told by your health care provider. Do not stop taking the antibiotic even if you start to feel better.  Keep all follow-up visits as told by your health care provider. This is important. Contact a health care provider if:  You have bleeding from your nose.  There is a lump on your neck.  You are not getting better in 5 days.  You feel worse instead of better. Get help right away if:  You have severe pain that is not controlled with medicine.  You have swelling, redness, or pain around your ear.  You have stiffness in your neck.  A part of your face is paralyzed.  The bone behind your ear (mastoid) is tender when you touch it.  You develop a severe headache. Summary  Otitis media is redness, soreness, and swelling of the middle ear.  This condition usually goes away on its own within 3-5 days.  If the problem does not go away in 3-5 days, your health care provider may prescribe or recommend medicines to treat your symptoms.  If you were prescribed an antibiotic medicine, take it as told by your health care provider. This information is not intended to replace advice given to you by   your health care provider. Make sure you discuss any questions you have with your health care provider. Document Released: 07/07/2004 Document Revised: 09/22/2016 Document Reviewed: 09/22/2016 Elsevier Interactive Patient Education  2019 [employer] (COVID-19) Are you at risk?  Are you at risk for the Coronavirus (COVID-19)?  To be considered HIGH RISK for Coronavirus (COVID-19), you have to meet the following criteria:  . Traveled to [place], [place], [place], [place] or [place]; or in the [place] to [place], [place], [place], or [place]; and have fever, cough, and shortness of breath within the last 2 weeks of travel OR . Been in close contact with a person diagnosed with COVID-19 within the last 2 weeks and have fever, cough, and shortness of breath . IF YOU DO NOT MEET THESE CRITERIA, YOU ARE CONSIDERED LOW RISK FOR COVID-19.  What to do if you are HIGH RISK for COVID-19?  [name] If you are having a medical emergency, call 911. . Seek medical care right away. Before you go to a doctor's office, urgent care or emergency department, call ahead and tell them about your recent travel, contact with someone diagnosed with COVID-19, and your symptoms. You should receive instructions from your physician's office regarding next steps of care.  . When you arrive at healthcare provider, tell the healthcare staff immediately you have returned from visiting [place], [place], [place], [place] or [place]; or traveled in the [place] to [place], [place], [place], or [place]; in the last two weeks or you have been in close contact with a person diagnosed with COVID-19 in the last 2 weeks.   . Tell the health care staff about your symptoms: fever, cough and shortness of breath. . After you have been seen by a medical provider, you will be either: o Tested for (COVID-19) and discharged home on quarantine except to seek medical care if symptoms worsen, and asked to  - Stay home and avoid contact with others until you get your results (4-5 days)  - Avoid travel on public transportation if possible (such as bus, train, or airplane) or o Sent to the Emergency Department by EMS for evaluation, COVID-19 testing, and possible admission depending on your condition and test results.  What to do if you are LOW RISK for COVID-19?  Reduce your risk of any infection by using the same precautions used for avoiding the common cold or flu:  [name] Wash your hands often with soap and warm water for at least 20 seconds.  If soap and water are  not readily available, use an alcohol-based hand sanitizer with at least 60% alcohol.  . If coughing or sneezing, cover your mouth and nose by coughing or sneezing into the elbow areas of your shirt or coat, into a tissue or into your sleeve (not your hands). . Avoid shaking hands with others and consider head nods or verbal greetings only. . Avoid touching your eyes, nose, or mouth with unwashed hands.  . Avoid close contact with people who are sick. . Avoid places or events with large numbers of people in one location, like concerts or sporting events. . Carefully consider travel plans you have or are making. . If you are planning any travel outside or inside the [place], visit the CDC's Travelers' Health webpage for the latest health notices. . If you have some symptoms but not all symptoms, continue to monitor at home and seek medical attention if your symptoms worsen. . If you are  having a medical emergency, call 911.   [place] / e-Visit: [email or web address]         [facility]: [phone]  [facility]: [phone]                   [facility]: [phone]   Please take Doxycycline as directed. Increase fluids, rest, and alternate OTC Acetaminophen and Ibuprofen as needed. If symptoms persist after antibiotic completed, please call clinic. Please all clinic with questions/concerns. FEEL BETTER!

## 2019-01-06 NOTE — Progress Notes (Signed)
Subjective:    Patient ID: Duane Warren, male    DOB: [birth year], 63 y.o.   MRN: [id number]  HPI:  Mr. Trayer presents with intermittent bil ear pain that started >1.5 weeks ago. He also reports non-productive cough, sore throat (3/10), clear nasal drainage, and constant post-nasal gtt. He denies CP/dyspnea/dizziness/decrease in hearing He denies fever/night sweats/chills/N/V/D He denies recent travel outside [employer] He denies know exposure to COVID-19 He denies tobacco/vape use He has not used any OTC remedies, but has been using Flonase sporadically  Of Note- 10/17/2018 he was seen at [facility] -  dx'd with R Otitis Media given Amoxil and informed to follow up with his PCP.   11/13/2018 he was seen by Dr. Sharee Holster- dx'd with chronic pansinusitis, treated with Prednisone taper  Patient Care Team    Relationship Specialty Notifications Start End  Thomasene Lot, DO PCP - General Family Medicine  07/25/18   Antonieta Iba, MD Consulting Physician Cardiology  07/25/18   Izell [place], MD Referring Physician Endocrinology  07/25/18   Gillian Scarce Physician Assistant Family Medicine  07/25/18     Patient Active Problem List   Diagnosis Date Noted  . Acute otitis media 01/06/2019  . Glucose intolerance (impaired glucose tolerance)- new onset 09/25/2018  . Muscle spasm of calf 08/01/2018  . Environmental and seasonal allergies 07/25/2018  . Coronary artery calcification seen on CAT scan 11/26/2017  . Panic disorder 07/08/2017  . Adjustment disorder with mixed anxiety and depressed mood 05/23/2017  . Anxiety 05/23/2017  . Ventricular tachycardia, non-sustained (HCC) 04/27/2017  . Tremor 03/19/2017  . Apical variant hypertrophic cardiomyopathy (HCC) 01/18/2017  . Peripheral vertigo involving left ear 11/07/2016  . Vitamin D deficiency 10/02/2016  . Adenoma of left adrenal gland 09/19/2016  . Left hip pain 08/18/2016  . Psychophysiological  insomnia 07/10/2016  . Sensorineural hearing loss (SNHL) of left ear with unrestricted hearing of right ear 05/29/2016  . Tinnitus of both ears 05/29/2016  . Metatarsal deformity 04/06/2016  . 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 Carotid atherosclerosis 12/29/2015  . Seasonal allergic rhinitis due to pollen 12/29/2015  . Cervical radiculopathy 11/11/2014  . Lipoma of neck 11/11/2014  . Primary aldosteronism (HCC) 07/17/2013  . Abnormal EKG 07/17/2013  . Dizziness 07/17/2013  . 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 Hypertension 09/12/2011     Past Medical History:  Diagnosis Date  . Agatston coronary artery calcium score greater than 400    a. 12/2015 Cardiac CT: Ca2+ score of 726 (91st %'ile).  . Anxiety   . Apical variant hypertrophic cardiomyopathy (HCC)    a. 01/2016 Echo: EF 55-60%, no rwma, mild AI, Ao root 39mm, mild MR, mod dil LA, mod TR, nl PASP; b. 12/2016 Cardiac MRI: EF 64%, sev apical hypertrophy up to 22mm in diastole. LGE in apical inf, lateral, and apical walls.  . Dilated aortic root (HCC)    a. 01/2016 Ao root 39mm; b. 12/2016 Cardiac MRI: Mild aneurysmal dil of Asc Ao - 42mm.  [name] Hyperaldosteronism (HCC)   . Hypertension   . Syncope    a. s/p MDT Linq.     Past Surgical History:  Procedure Laterality Date  . HERNIA REPAIR    . KNEE SURGERY     unknown which side  . LOOP RECORDER INSERTION N/A 04/30/2017   Procedure: Loop Recorder Insertion;  Surgeon: [name], MD;  Location: [facility] INVASIVE CV LAB;  Service: Cardiovascular;  Laterality: N/A;  .  SKIN GRAFT       Family History  Problem Relation Age of Onset  . Hypertension Mother   . Heart attack Father   . Hypertension Father   . Spina bifida Sister      Social History   Substance and Sexual Activity  Drug Use No     Social History   Substance and Sexual Activity  Alcohol Use Yes  . Alcohol/week: 1.0 standard drinks  . Types: 1 Glasses of wine per week   Comment: 2 glasses  wine a week     Social History   Tobacco Use  Smoking Status Never Smoker  Smokeless Tobacco Never Used     Outpatient Encounter Medications as of 01/06/2019  Medication Sig  . albuterol (PROVENTIL HFA;VENTOLIN HFA) 108 (90 Base) MCG/ACT inhaler Inhale 2 puffs into the lungs every 6 (six) hours as needed for wheezing or shortness of breath.  [name] amLODipine (NORVASC) 10 MG tablet Take 10 mg by mouth daily.  . Cholecalciferol (VITAMIN D) 2000 units tablet Take 5,000 Units by mouth daily.  [name] losartan (COZAAR) 25 MG tablet TAKE 2 TABLETS BY MOUTH EVERY DAY  . OLOPATADINE HCL NA Place 2 sprays into the nose daily as needed (congestion).  . rosuvastatin (CRESTOR) 20 MG tablet Take 1 tablet (20 mg total) by mouth at bedtime.  [name] spironolactone (ALDACTONE) 25 MG tablet Take 25 mg by mouth daily.  [name] doxycycline (VIBRA-TABS) 100 MG tablet Take 1 tablet (100 mg total) by mouth 2 (two) times daily.  . [DISCONTINUED] predniSONE (DELTASONE) 20 MG tablet Take 3 pills a day for 2 days, 2 pills a day for 2 days, 1 pill a day for 2 days then one half pill a day for 2 days then off   No facility-administered encounter medications on file as of 01/06/2019.     Allergies: Patient has no known allergies.  Body mass index is 28.38 kg/m.  Blood pressure 136/69, pulse 63, temperature 98.6 F (37 C), temperature source Oral, height 5\' 11"  (1.803 m), weight 203 lb 8 oz (92.3 kg), SpO2 100 %.  Review of Systems  Constitutional: Positive for fatigue. Negative for activity change, appetite change, chills, diaphoresis and fever.  HENT: Positive for congestion, ear pain, postnasal drip, rhinorrhea, sinus pressure and sore throat. Negative for ear discharge, sinus pain, trouble swallowing and voice change.   Eyes: Negative for photophobia, pain, discharge, redness, itching and visual disturbance.  Respiratory: Positive for cough. Negative for chest tightness, shortness of breath, wheezing and stridor.    Cardiovascular: Negative for chest pain, palpitations and leg swelling.  Gastrointestinal: Negative for abdominal distention, abdominal pain, constipation, diarrhea, nausea and vomiting.  Neurological: Negative for dizziness and headaches.  Hematological: Does not bruise/bleed easily.       Objective:   Physical Exam Vitals signs and nursing note reviewed.  Constitutional:      General: He is not in acute distress.    Appearance: Normal appearance. He is not ill-appearing, toxic-appearing or diaphoretic.  HENT:     Head: Normocephalic and atraumatic.     Right Ear: Hearing normal. No decreased hearing noted. There is impacted cerumen. Tympanic membrane is erythematous and bulging.     Left Ear: Hearing normal. No decreased hearing noted. There is no impacted cerumen. Tympanic membrane is erythematous and bulging.     Ears:     Comments: Ears flushed- large amount of cerumen removed from R ear Pt tolerated well      Nose: Mucosal edema,  congestion and rhinorrhea present.     Right Turbinates: Swollen.     Left Turbinates: Swollen.     Right Sinus: No maxillary sinus tenderness or frontal sinus tenderness.     Left Sinus: No maxillary sinus tenderness or frontal sinus tenderness.     Mouth/Throat:     Pharynx: Posterior oropharyngeal erythema present. No oropharyngeal exudate.     Tonsils: 1+ on the right. 1+ on the left.  Eyes:     Extraocular Movements: Extraocular movements intact.     Conjunctiva/sclera: Conjunctivae normal.     Pupils: Pupils are equal, round, and reactive to light.  Neck:     Musculoskeletal: Normal range of motion.      Comments: Large lipoma posterior cervical neck- right side Non tender when palpated  Cardiovascular:     Rate and Rhythm: Normal rate.     Pulses: Normal pulses.     Heart sounds: Normal heart sounds. No murmur. No friction rub. No gallop.   Pulmonary:     Effort: Pulmonary effort is normal. No respiratory distress.     Breath sounds:  Normal breath sounds. No stridor. No wheezing, rhonchi or rales.  Skin:    General: Skin is warm and dry.     Capillary Refill: Capillary refill takes more than 3 seconds.  Neurological:     Mental Status: He is alert and oriented to person, place, and time.  Psychiatric:        Mood and Affect: Mood normal.        Behavior: Behavior normal.        Thought Content: Thought content normal.        Judgment: Judgment normal.       Assessment & Plan:   1. Acute otitis media, unspecified otitis media type   2. Lipoma of neck     Lipoma of neck Large lipoma posterior cervical neck- right side Non tender when palpated   Acute otitis media He was treated with Amoxil early Jan 2020 for R Otitis Media Treated with Prednisone taper at end of Jan 2020 Ears flushed- large amount of cerumen removed from R ear- pt tolerated well Today given course of Doxycycline Increase fluids, rest, and alternate OTC Acetaminophen and Ibuprofen as needed. If symptoms persist after antibiotic completed, please call clinic. Please all clinic with questions/concerns.    FOLLOW-UP:  Return if symptoms worsen or fail to improve.

## 2019-01-06 NOTE — Assessment & Plan Note (Addendum)
Large lipoma posterior cervical neck- right side Non tender when palpated

## 2019-01-06 NOTE — Assessment & Plan Note (Addendum)
He was treated with Amoxil early Jan 2020 for R Otitis Media Treated with Prednisone taper at end of Jan 2020 Ears flushed- large amount of cerumen removed from R ear- pt tolerated well Today given course of Doxycycline Increase fluids, rest, and alternate OTC Acetaminophen and Ibuprofen as needed. If symptoms persist after antibiotic completed, please call clinic. Please all clinic with questions/concerns.

## 2019-01-20 ENCOUNTER — Other Ambulatory Visit: Payer: Self-pay

## 2019-01-20 ENCOUNTER — Other Ambulatory Visit: Payer: No Typology Code available for payment source

## 2019-01-20 DIAGNOSIS — Z79899 Other long term (current) drug therapy: Secondary | ICD-10-CM

## 2019-01-20 DIAGNOSIS — E785 Hyperlipidemia, unspecified: Secondary | ICD-10-CM

## 2019-01-21 LAB — LIPID PANEL
Chol/HDL Ratio: 2.4 ratio (ref 0.0–5.0)
Cholesterol, Total: 114 mg/dL (ref 100–199)
HDL: 47 mg/dL (ref >39)
LDL Calculated: 56 mg/dL (ref 0–99)
Triglycerides: 57 mg/dL (ref 0–149)
VLDL Cholesterol Cal: 11 mg/dL (ref 5–40)

## 2019-01-21 LAB — HEPATIC FUNCTION PANEL
ALT: 27 IU/L (ref 0–44)
AST: 20 IU/L (ref 0–40)
Albumin: 4 g/dL (ref 3.8–4.8)
Alkaline Phosphatase: 70 IU/L (ref 39–117)
Bilirubin Total: 0.6 mg/dL (ref 0.0–1.2)
Bilirubin, Direct: 0.2 mg/dL (ref 0.00–0.40)
Total Protein: 5.9 g/dL — ABNORMAL LOW (ref 6.0–8.5)

## 2019-01-22 ENCOUNTER — Telehealth: Payer: Self-pay | Admitting: Family Medicine

## 2019-01-22 NOTE — Telephone Encounter (Signed)
Patient called stating he saw labs on MyChart and thought Dr. Jenetta Downer also wanted a Vit D too, if that is the case can we please add this to the previously collected specimen?

## 2019-01-23 ENCOUNTER — Other Ambulatory Visit: Payer: Self-pay

## 2019-01-23 ENCOUNTER — Ambulatory Visit (INDEPENDENT_AMBULATORY_CARE_PROVIDER_SITE_OTHER): Payer: No Typology Code available for payment source | Admitting: Family Medicine

## 2019-01-23 ENCOUNTER — Encounter: Payer: Self-pay | Admitting: Family Medicine

## 2019-01-23 VITALS — BP 122/68 | Ht 71.0 in | Wt 195.0 lb

## 2019-01-23 DIAGNOSIS — F4323 Adjustment disorder with mixed anxiety and depressed mood: Secondary | ICD-10-CM

## 2019-01-23 DIAGNOSIS — E7841 Elevated Lipoprotein(a): Secondary | ICD-10-CM

## 2019-01-23 DIAGNOSIS — E785 Hyperlipidemia, unspecified: Secondary | ICD-10-CM | POA: Insufficient documentation

## 2019-01-23 DIAGNOSIS — R7302 Impaired glucose tolerance (oral): Secondary | ICD-10-CM

## 2019-01-23 DIAGNOSIS — I422 Other hypertrophic cardiomyopathy: Secondary | ICD-10-CM

## 2019-01-23 DIAGNOSIS — R55 Syncope and collapse: Secondary | ICD-10-CM

## 2019-01-23 DIAGNOSIS — I6523 Occlusion and stenosis of bilateral carotid arteries: Secondary | ICD-10-CM

## 2019-01-23 DIAGNOSIS — R931 Abnormal findings on diagnostic imaging of heart and coronary circulation: Secondary | ICD-10-CM

## 2019-01-23 DIAGNOSIS — I10 Essential (primary) hypertension: Secondary | ICD-10-CM | POA: Diagnosis not present

## 2019-01-23 DIAGNOSIS — I77810 Thoracic aortic ectasia: Secondary | ICD-10-CM | POA: Insufficient documentation

## 2019-01-23 DIAGNOSIS — Z79899 Other long term (current) drug therapy: Secondary | ICD-10-CM

## 2019-01-23 DIAGNOSIS — E559 Vitamin D deficiency, unspecified: Secondary | ICD-10-CM

## 2019-01-23 NOTE — Progress Notes (Signed)
Virtual Visit via Telephone Note for [employer], D.O- Primary Care Physician at [facility]   I connected with current patient today by telephone and verified that I am speaking with the correct person using two identifiers.   Because of federal recommendations of social distancing due to the current novel COVID-19 outbreak, an audio/video telehealth visit is felt to be most appropriate for this patient at this time.  My staff members also discussed with the patient that there may be a patient charge related to this service.   The patient expressed understanding, and agreed to proceed.    History of Present Illness:   Last chronic office visit with me was 09/16/2018.    HTN:   At that time blood pressure was 146/86.   Patient declined to make a change in his blood pressure medicines at that time and said he would check his blood pressure closely at home and call us about home blood pressure measurements.  - BP's at home 120's/ 60's   HLD: Also at that time we increase his Crestor from 10 to 20 due to ASCVD risk of 18.8%.  He is here to discuss his repeat cholesterol panel as well as go over liver enzymes since increase in dose.   Vit D- was low when last checked- pt went from 2k iU daily up    Patient sees cardiology for his hypertrophic cardiomyopathy and hypertension, as well as for his carotid atherosclerotic disease and coronary artery atherosclerosis seen on CT scan.   Seen by Dr. Caryl Comes  ( also sees [place]) .  Last office visit with a cardiologist was October 27, 2017.  Patient is long overdue. Told him to make appt -  yrly-       -->  Patient symptoms are stable, he has no complaints today.   Sees endocrinology Dr. 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 Sx stable     Wt Readings from Last 3 Encounters:  01/23/19 195 lb (88.5 kg)  01/06/19 203 lb 8 oz (92.3 kg)  11/13/18 204 lb 11.2 oz (92.9 kg)    BP Readings from Last 3 Encounters:   01/23/19 122/68  01/06/19 136/69  11/13/18 140/85    Pulse Readings from Last 3 Encounters:  01/06/19 63  11/13/18 73  09/16/18 (!) 58    BMI Readings from Last 3 Encounters:  01/23/19 27.20 kg/m  01/06/19 28.38 kg/m  11/13/18 28.55 kg/m      -Vitals obtained; Medications, allergies reconciled;  personal medical, social, Sx etc. etc. histories were updated by Lanier Prude the medical assistant today and are reflected in below chart   Patient Care Team    Relationship Specialty Notifications Start End  [name], DO PCP - General Family Medicine  07/25/18   [name], MD Consulting Physician Cardiology  07/25/18   [name], MD Referring Physician Endocrinology  07/25/18   Mariel Sleet Physician Assistant Family Medicine  07/25/18      Patient Active Problem List   Diagnosis Date Noted  . Hyperlipidemia 01/23/2019    Priority: High  . Adjustment disorder with mixed anxiety and depressed mood 05/23/2017    Priority: High  . Apical variant hypertrophic cardiomyopathy ([place]) 01/18/2017    Priority: High  . Adenoma of left adrenal gland 09/19/2016    Priority: High  . 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 Hypertension 09/12/2011    Priority: High  .  Coronary artery calcification seen on CAT scan 11/26/2017    Priority: Medium  . Panic disorder 07/08/2017    Priority: Medium  . Psychophysiological insomnia 07/10/2016    Priority: Medium  . Carotid atherosclerosis 12/29/2015    Priority: Medium  . Elevated lipoprotein(a) 07/17/2013    Priority: Medium  . Environmental and seasonal allergies 07/25/2018    Priority: Low  . Anxiety 05/23/2017    Priority: Low  . Vitamin D deficiency 10/02/2016    Priority: Low  . Agatston coronary artery calcium score greater than 400 01/23/2019  . Dilated aortic root ([place]) 01/23/2019  . Syncope 01/23/2019  . Acute otitis media 01/06/2019  . Glucose intolerance  (impaired glucose tolerance)- new onset 09/25/2018  . Muscle spasm of calf 08/01/2018  . Ventricular tachycardia, non-sustained ([place]) 04/27/2017  . Tremor 03/19/2017  . Peripheral vertigo involving left ear 11/07/2016  . Left hip pain 08/18/2016  . Sensorineural hearing loss (SNHL) of left ear with unrestricted hearing of right ear 05/29/2016  . Tinnitus of both ears 05/29/2016  . Metatarsal deformity 04/06/2016  . 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 Seasonal allergic rhinitis due to pollen 12/29/2015  . Cervical radiculopathy 11/11/2014  . Lipoma of neck 11/11/2014  . Abnormal EKG 07/17/2013  . Dizziness 07/17/2013     Current Meds  Medication Sig  . albuterol (PROVENTIL HFA;VENTOLIN HFA) 108 (90 Base) MCG/ACT inhaler Inhale 2 puffs into the lungs every 6 (six) hours as needed for wheezing or shortness of breath.  [name] amLODipine (NORVASC) 10 MG tablet Take 10 mg by mouth daily.  . Cholecalciferol (VITAMIN D) 2000 units tablet Take 4,000 Units by mouth daily.   [name] losartan (COZAAR) 25 MG tablet TAKE 2 TABLETS BY MOUTH EVERY DAY  . OLOPATADINE HCL NA Place 2 sprays into the nose daily as needed (congestion).  . rosuvastatin (CRESTOR) 20 MG tablet Take 1 tablet (20 mg total) by mouth at bedtime.  [name] spironolactone (ALDACTONE) 25 MG tablet Take 25 mg by mouth daily.     Allergies:  No Known Allergies   ROS:  See above HPI for pertinent positives and negatives   Objective:   Blood pressure 122/68, height 5\' 11"  (1.803 m), weight 195 lb (88.5 kg). (if some vitals are omitted, this means that patient was UNABLE to obtain them even though asked to get them prior to OV today) General: sounds in no acute distress.  Skin: Pt confirms warm and dry  extremities and pink fingertips Respiratory: speaking in full sentences, no conversational dyspnea Psych: A and O *3, appears insight good, mood- full      Impression and Recommendations:      ICD-10-CM   1. Hyperlipidemia,  unspecified hyperlipidemia type E78.5   2. Hypertension I10   3. Apical variant hypertrophic cardiomyopathy (HCC) I42.2   4. Adjustment disorder with mixed anxiety and depressed mood F43.23   5. Glucose intolerance (impaired glucose tolerance)- new onset R73.02   6. Vitamin D deficiency E55.9   7. Atherosclerosis of both carotid arteries I65.23   8. Agatston coronary artery calcium score greater than 400 R93.1   9. Dilated aortic root (HCC) I77.810   10. Syncope, unspecified syncope type R55      #1  -LDL at goal of under 70 now at 56.  Continue higher dose Crestor.  LFTs normal.  In addition to medications I discussed dietary lifestyle changes with patient.  #2 hypertension: Blood pressure is at goal.  Patient is without any orthostatic hypotensive  type symptoms thus, being in the 120s over 60s-70s is okay.  We will continue current medications  - told patient to please follow-up with his cardiologist for his hypertrophic cardiomyopathy as he has not been seen since January 2019 and I recommend he goes yearly.  #4  mood-stable  - Novel Covid -19 counseling done; all questions were answered.   - Current CDC and federal guidelines reviewed with patient  - Reminded pt of extreme importance of social distancing; minimizing contacts with others, avoiding ALL but emergency appts etc. - Told patient to be prepared, not scared; and be smart for the sake of others - told to call with any concerns  #5 Will need A1c in 4 to 6 months as I would like to check it yearly.  Last done 5.8.  #6 patient increase his vitamin D approximately 4 months ago from 2000 to 4000 IUs for vitamin D in the low 30s.  We have added on this lab as unfortunately was not drawn recently and, will adjust medicines if needed based on results.   --> In 4 months we will obtain CMP and A1c then OV couple days later  As part of my medical decision making, I reviewed the following data within the [place]  History obtained from pt/family, CMA notes reviewed and incorporated, Labs reviewed, Radiograph/ tests reviewed if applicable and OV notes from prior OV's with me, as well as other specialists he has seen since seeing me last, were all reviewed and used in my medical decision making process today. Additionally, discussion had with patient regarding txmnt plan, their biases about that plan etc were used in my medical decision making today.  I discussed the assessment and treatment plan with the patient. The patient was provided an opportunity to ask questions and all were answered.  The patient agreed with the plan and demonstrated an understanding of the instructions.   No barriers to understanding were identified.  Red flag symptoms and signs discussed in detail.  Patient expressed understanding regarding what to do in case of emergency\urgent symptoms   The patient was advised to call back or seek an in-person evaluation if the symptoms worsen or if the condition fails to improve as anticipated.   Return for CMP and A1c in 57mo w OV 3-5 d later.  (CMA was asked to please place these orders via epic)    Medications Discontinued During This Encounter  Medication Reason  . doxycycline (VIBRA-TABS) 100 MG tablet Completed Course      **Gross side effects, risk and benefits, and alternatives of medications and treatment plan in general discussed with patient.  Patient is aware that all medications have potential side effects and we are unable to predict every side effect or drug-drug interaction that may occur.   Patient was strongly encouraged to call with any questions or concerns they may have concerns.     I provided 16+ minutes of  non-face-to-face time during this encounter.   [name], DO

## 2019-01-23 NOTE — Assessment & Plan Note (Signed)
Atherosclerosis of both carotid arteries, AND coronary artery calcification seen on CAT scan - Reviewed need for patient to continue to follow up with Dr. Rockey Situ of cardiology.  - Reviewed need to control blood pressure and cholesterol to prevent an event from occurring.

## 2019-01-23 NOTE — Telephone Encounter (Signed)
Labs have been added. MPulliam, CMA/RT(R)

## 2019-01-24 LAB — COMPREHENSIVE METABOLIC PANEL
ALT: 27 IU/L (ref 0–44)
AST: 21 IU/L (ref 0–40)
Albumin/Globulin Ratio: 2.3 — ABNORMAL HIGH (ref 1.2–2.2)
Albumin: 4.1 g/dL (ref 3.8–4.8)
Alkaline Phosphatase: 70 IU/L (ref 39–117)
BUN/Creatinine Ratio: 13 (ref 10–24)
BUN: 16 mg/dL (ref 8–27)
Bilirubin Total: 0.5 mg/dL (ref 0.0–1.2)
CO2: 20 mmol/L (ref 20–29)
Calcium: 9.4 mg/dL (ref 8.6–10.2)
Chloride: 105 mmol/L (ref 96–106)
Creatinine, Ser: 1.21 mg/dL (ref 0.76–1.27)
GFR calc Af Amer: 73 mL/min/{1.73_m2} (ref >59)
GFR calc non Af Amer: 63 mL/min/{1.73_m2} (ref >59)
Globulin, Total: 1.8 g/dL (ref 1.5–4.5)
Glucose: 107 mg/dL — ABNORMAL HIGH (ref 65–99)
Potassium: 4.2 mmol/L (ref 3.5–5.2)
Sodium: 144 mmol/L (ref 134–144)
Total Protein: 5.9 g/dL — ABNORMAL LOW (ref 6.0–8.5)

## 2019-01-24 LAB — SPECIMEN STATUS REPORT

## 2019-01-24 LAB — VITAMIN D 25 HYDROXY (VIT D DEFICIENCY, FRACTURES): Vit D, 25-Hydroxy: 62.9 ng/mL (ref 30.0–100.0)

## 2019-01-29 ENCOUNTER — Ambulatory Visit (INDEPENDENT_AMBULATORY_CARE_PROVIDER_SITE_OTHER): Payer: No Typology Code available for payment source | Admitting: *Deleted

## 2019-01-29 ENCOUNTER — Other Ambulatory Visit: Payer: Self-pay

## 2019-01-29 DIAGNOSIS — R55 Syncope and collapse: Secondary | ICD-10-CM | POA: Diagnosis not present

## 2019-01-29 DIAGNOSIS — I422 Other hypertrophic cardiomyopathy: Secondary | ICD-10-CM

## 2019-01-30 LAB — CUP PACEART REMOTE DEVICE CHECK
Date Time Interrogation Session: 20200416000858
Implantable Pulse Generator Implant Date: 20180716

## 2019-02-04 NOTE — Progress Notes (Signed)
Carelink Summary Report / Loop Recorder 

## 2019-02-11 ENCOUNTER — Telehealth: Payer: No Typology Code available for payment source | Admitting: Physician Assistant

## 2019-02-11 DIAGNOSIS — R21 Rash and other nonspecific skin eruption: Secondary | ICD-10-CM

## 2019-02-11 NOTE — Progress Notes (Signed)
Based on what you shared with me, I feel your condition warrants further evaluation and I recommend that you be seen for a face to face office visit.     NOTE: If you entered your credit card information for this eVisit, you will not be charged. You may see a "hold" on your card for the $35 but that hold will drop off and you will not have a charge processed.  If you are having a true medical emergency please call 911.  If you need an urgent face to face visit, [place] has four urgent care centers for your convenience.    PLEASE NOTE: THE [facility] LOCATIONS AND URGENT CARE CLINICS DO NOT HAVE THE TESTING FOR CORONAVIRUS COVID19 AVAILABLE.  IF YOU FEEL YOU NEED THIS TEST YOU MUST GO TO A TRIAGE LOCATION AT [facility] ?  [email or web address] to reserve your spot online an avoid wait times  [facility] [address] [place], [place] [address] Modified hours of operation: Monday-Friday, 10 AM to 6 PM  Saturday & Sunday 10 AM to 4 PM *Across the street from [place] (New Address!) [address] [place], [place] [address] *Just off [address], across the road from [place] hours of operation: Monday-Friday, 10 AM to 5 PM  Closed Saturday & Sunday   The following sites will take your insurance:  [facility] a Provider at this Location  [place], [place] [address] 10 am to 8 pm Monday-Friday 12 pm to 8 pm [facility] [place] a Provider at this Location  [place], [place] [address] [place], [place] [address] 8 am to 8 pm Monday-Friday 9 am to 6 pm Saturday 11 am to 6 pm Sunday   [facility]  [phone] Get Driving Directions  [address] [place], [place] [address] 8 am to 8 pm Monday-Friday 8 am to 4 pm Saturday-Sunday   Your e-visit answers were reviewed by a board certified advanced clinical practitioner to complete your personal care plan.  Thank you for using e-Visits.  ===View-only below this line===   ----- Message -----    From: Tawana Scale    Sent: 02/11/2019 11:01 AM EDT      To: E-Visit Mailing List Subject: E-Visit Submission: Unknown Jim  E-Visit Submission: Unknown Jim --------------------------------  Question: Where is your rash located?  Answer:   Other  Question: Location of rash comments Answer:   pubic area  Question: Have you had a similar rash in the past? Answer:   No  Question: Does your rash itch? Answer:   Yes  Question: Describe the rash:  Answer:   Scaling, or flaking  Question: Do you have any drainage or pus from the rash? Answer:   No  Question: When did your rash start? Answer:   More than 2 weeks ago  Question: Have you been spending time at the gym, using public showers, or a public pool? Answer:   Yes  Question: Have you tried any over-the-counter medications? Answer:   Yes  Question: Please list over-the-counter medications you have tried: Answer:   Healing lotion, Antibiotic Ointment  Question: Did any of the medications help? Answer:   No  Question: Do you have any of the following conditions?  Answer:  None of the above  Question: Please list any additional comments  Answer:   Looks like contact contact dermatitis, I think I have it on my arm from my watch. Also I have been mixing bath products  Question: Are you able to attach a photo? Please do if possible Answer:   No  Question: Please list your medication allergies that you may have ? (If 'none' , please list as 'none') Answer:   None that I know of

## 2019-02-12 ENCOUNTER — Other Ambulatory Visit: Payer: Self-pay

## 2019-02-12 ENCOUNTER — Ambulatory Visit (INDEPENDENT_AMBULATORY_CARE_PROVIDER_SITE_OTHER): Payer: No Typology Code available for payment source | Admitting: Family Medicine

## 2019-02-12 ENCOUNTER — Encounter: Payer: Self-pay | Admitting: Family Medicine

## 2019-02-12 VITALS — BP 129/76 | HR 85 | Ht 71.0 in | Wt 197.0 lb

## 2019-02-12 DIAGNOSIS — B356 Tinea cruris: Secondary | ICD-10-CM | POA: Diagnosis not present

## 2019-02-12 NOTE — Progress Notes (Signed)
Virtual / live video office visit note for [employer], D.O- Primary Care Physician at [facility]   I connected with current patient today and beyond visually recognizing the correct individual, I verified that I am speaking with the correct person using two identifiers.  . Location of the patient: Home . Location of the provider: Office Only the patient (+/- their family members at pt's discretion) and myself were participating in the encounter    - This visit type was conducted due to national recommendations for restrictions regarding the COVID-19 Pandemic (e.g. social distancing) in an effort to limit this patient's exposure and mitigate transmission in our community.  This format is felt to be most appropriate for this patient at this time.   - The patient did have access to video technology today     - No physical exam could be performed with this format, beyond that communicated to [place] by the patient/ family members as noted.   - Additionally my office staff/ schedulers discussed with the patient that there may be a monetary charge related to this service, depending on patient's medical insurance.   The patient expressed understanding, and agreed to proceed.      History of Present Illness:  Patient has had a rash in his bilateral groin area where his leg meets his body in the upper inner region.  It is itchy at times, sometimes can be scaly and other times can be red and irritated feeling.  He denies any fever chills, denies redness around the areas and does not think this is an infection.  It has been there for many weeks it comes and goes and typically he gets this as the weather warms and he sweats more.  He wears tight fitted underpants.  He has not used anything on it    Impression and Recommendations:    1. Tinea cruris Advised patient to use over-the-counter clotrimazole- Lotrimin cream for "jock itch ".  Told him to also keep the area dry and open to air as much as  possible.  We discussed over-the-counter medicines such as anti-monkey butt powder and /or zinc ointments for diaper rash etc. that can keep the area protected when it is red and feels irritated. -Patient will let us know if it does not clear with these practices and we will call in Diflucan for him at 100 mg every other day x 5 doses; totaling 10 days  - As part of my medical decision making, I reviewed the following data within the [place] History obtained from pt /family, CMA notes reviewed and incorporated if applicable, Labs reviewed, Radiograph/ tests reviewed if applicable and OV notes from prior OV's with me, as well as other specialists she/he has seen since seeing me last, were all reviewed and used in my medical decision making process today.   - Additionally, discussion had with patient regarding txmnt plan, their biases about that plan etc were used in my medical decision making today.   - The patient agreed with the plan and demonstrated an understanding of the instructions.   No barriers to understanding were identified.   - Red flag symptoms and signs discussed in detail.  Patient expressed understanding regarding what to do in case of emergency\ urgent symptoms.  The patient was advised to call back or seek an in-person evaluation if the symptoms worsen or if the condition fails to improve as anticipated.   Return if symptoms worsen or fail  to improve, for Also told patient to follow-up for chronic conditions as previously discussed.    I provided 13+ minutes of non-face-to-face time during this encounter,with over 50% of the time in direct counseling on patients medical conditions/ medical concerns.  Additional time was spent with charting and coordination of care after the actual visit commenced.   Note:  This note was prepared with assistance of Dragon voice recognition software. Occasional wrong-word or sound-a-like substitutions may have occurred due to the  inherent limitations of voice recognition software.  [name], DO     Patient Care Team    Relationship Specialty Notifications Start End  [name], DO PCP - General Family Medicine  07/25/18   [name], MD Consulting Physician Cardiology  07/25/18   [name], MD Referring Physician Endocrinology  07/25/18   Elisabeth Cara, PA-C Physician Assistant Family Medicine  07/25/18     -Vitals obtained; medications/ allergies reconciled;  personal medical, social, Sx etc.histories were updated by CMA, reviewed by me and are reflected in chart  Patient Active Problem List   Diagnosis Date Noted  . Hyperlipidemia 01/23/2019    Priority: High  . Adjustment disorder with mixed anxiety and depressed mood 05/23/2017    Priority: High  . Apical variant hypertrophic cardiomyopathy ([place]) 01/18/2017    Priority: High  . Adenoma of left adrenal gland 09/19/2016    Priority: High  . Primary aldosteronism ([place]) 07/17/2013    Priority: High  . Hypertension 09/12/2011    Priority: High  . Coronary artery calcification seen on CAT scan 11/26/2017    Priority: Medium  . Panic disorder 07/08/2017    Priority: Medium  . Psychophysiological insomnia 07/10/2016    Priority: Medium  . Carotid atherosclerosis 12/29/2015    Priority: Medium  . Elevated lipoprotein(a) 07/17/2013    Priority: Medium  . Environmental and seasonal allergies 07/25/2018    Priority: Low  . Anxiety 05/23/2017    Priority: Low  . Vitamin D deficiency 10/02/2016    Priority: Low  . Tinea cruris 02/12/2019  . Agatston coronary artery calcium score greater than 400 01/23/2019  . Dilated aortic root ([place]) 01/23/2019  . Syncope 01/23/2019  . Acute otitis media 01/06/2019  . Glucose intolerance (impaired glucose tolerance)- new onset 09/25/2018  . Muscle spasm of calf 08/01/2018  . Ventricular tachycardia, non-sustained ([place]) 04/27/2017  . Tremor 03/19/2017  . Peripheral vertigo  involving left ear 11/07/2016  . Left hip pain 08/18/2016  . Sensorineural hearing loss (SNHL) of left ear with unrestricted hearing of right ear 05/29/2016  . Tinnitus of both ears 05/29/2016  . Metatarsal deformity 04/06/2016  . 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 Seasonal allergic rhinitis due to pollen 12/29/2015  . Cervical radiculopathy 11/11/2014  . Lipoma of neck 11/11/2014  . Abnormal EKG 07/17/2013  . Dizziness 07/17/2013     Current Meds  Medication Sig  . albuterol (PROVENTIL HFA;VENTOLIN HFA) 108 (90 Base) MCG/ACT inhaler Inhale 2 puffs into the lungs every 6 (six) hours as needed for wheezing or shortness of breath.  [name] amLODipine (NORVASC) 10 MG tablet Take 10 mg by mouth daily.  . Cholecalciferol (VITAMIN D) 2000 units tablet Take 4,000 Units by mouth daily.   . OLOPATADINE HCL NA Place 2 sprays into the nose daily as needed (congestion).  . rosuvastatin (CRESTOR) 20 MG tablet Take 1 tablet (20 mg total) by mouth at bedtime.  [name] spironolactone (ALDACTONE) 25 MG tablet Take 25 mg by mouth daily.  . [  DISCONTINUED] losartan (COZAAR) 25 MG tablet TAKE 2 TABLETS BY MOUTH EVERY DAY     No Known Allergies   ROS:  See above HPI for pertinent positives and negatives   Objective:   Blood pressure 129/76, pulse 85, height 5\' 11"  (1.803 m), weight 197 lb (89.4 kg).  (if some vitals are omitted, this means that patient was UNABLE to obtain them even though they were asked to get them prior to OV today.  They were asked to call us at their earliest convenience with these once obtained.)  General: A & O * 3; visually in no acute distress; in usual state of health.  Skin: Visible skin appears normal and pt's usual skin color, upper inner thighs/groin in the crease of his skin with mild erythema but surrounding skin areas with small patches of scaly silver-ish and erythematous raised skin lesions consistent with tinea HEENT:  EOMI, head is normocephalic and atraumatic.   Sclera are anicteric. Neck has a good range of motion.  Lips are noncyanotic Chest: normal chest excursion and movement Respiratory: speaking in full sentences, no conversational dyspnea; no use of accessory muscles Psych: insight good, mood- appears full

## 2019-02-24 ENCOUNTER — Other Ambulatory Visit: Payer: Self-pay | Admitting: Cardiovascular Disease

## 2019-03-03 ENCOUNTER — Ambulatory Visit (INDEPENDENT_AMBULATORY_CARE_PROVIDER_SITE_OTHER): Payer: No Typology Code available for payment source | Admitting: *Deleted

## 2019-03-03 ENCOUNTER — Other Ambulatory Visit: Payer: Self-pay

## 2019-03-03 DIAGNOSIS — R55 Syncope and collapse: Secondary | ICD-10-CM

## 2019-03-04 LAB — CUP PACEART REMOTE DEVICE CHECK
Date Time Interrogation Session: 20200519003951
Implantable Pulse Generator Implant Date: 20180716

## 2019-03-11 NOTE — Progress Notes (Signed)
Carelink Summary Report / Loop Recorder 

## 2019-03-19 ENCOUNTER — Encounter: Payer: Self-pay | Admitting: Family Medicine

## 2019-03-19 ENCOUNTER — Telehealth: Payer: Self-pay | Admitting: Family Medicine

## 2019-03-19 DIAGNOSIS — F410 Panic disorder [episodic paroxysmal anxiety] without agoraphobia: Secondary | ICD-10-CM

## 2019-03-19 DIAGNOSIS — R21 Rash and other nonspecific skin eruption: Secondary | ICD-10-CM

## 2019-03-19 MED ORDER — FLUCONAZOLE 100 MG PO TABS: 100.0000 mg | ORAL_TABLET | ORAL | 0 refills | Status: DC

## 2019-03-19 NOTE — Telephone Encounter (Signed)
Sent in medication per note in the chart per Dr. Raliegh Scarlet.  Patient notified. MPulliam, CMA/RT(R)

## 2019-03-19 NOTE — Telephone Encounter (Signed)
Patient called to inquire if Rx was ever sent to pharmacy, rash issue unresolved. (See msg below frm pt's chart)  -Patient will let us know if it does not clear with these practices and we will call in Diflucan for him at 100 mg every other day x 5 doses; totaling 10 days  --Forwarding msg to medical assistant for review-- advised pt to send msg via Mychart to provider as well.  --glh

## 2019-04-02 ENCOUNTER — Other Ambulatory Visit: Payer: Self-pay | Admitting: Family Medicine

## 2019-04-02 DIAGNOSIS — I2510 Atherosclerotic heart disease of native coronary artery without angina pectoris: Secondary | ICD-10-CM

## 2019-04-02 DIAGNOSIS — I6523 Occlusion and stenosis of bilateral carotid arteries: Secondary | ICD-10-CM

## 2019-04-02 DIAGNOSIS — E785 Hyperlipidemia, unspecified: Secondary | ICD-10-CM

## 2019-04-07 ENCOUNTER — Ambulatory Visit (INDEPENDENT_AMBULATORY_CARE_PROVIDER_SITE_OTHER): Payer: No Typology Code available for payment source | Admitting: *Deleted

## 2019-04-07 DIAGNOSIS — I4729 Other ventricular tachycardia: Secondary | ICD-10-CM

## 2019-04-07 DIAGNOSIS — R55 Syncope and collapse: Secondary | ICD-10-CM

## 2019-04-07 DIAGNOSIS — I472 Ventricular tachycardia: Secondary | ICD-10-CM

## 2019-04-07 LAB — CUP PACEART REMOTE DEVICE CHECK
Date Time Interrogation Session: 20200621013621
Implantable Pulse Generator Implant Date: 20180716

## 2019-04-14 NOTE — Progress Notes (Signed)
Carelink Summary Report / Loop Recorder 

## 2019-05-08 ENCOUNTER — Ambulatory Visit (INDEPENDENT_AMBULATORY_CARE_PROVIDER_SITE_OTHER): Payer: No Typology Code available for payment source | Admitting: *Deleted

## 2019-05-08 DIAGNOSIS — R55 Syncope and collapse: Secondary | ICD-10-CM

## 2019-05-08 DIAGNOSIS — I472 Ventricular tachycardia: Secondary | ICD-10-CM

## 2019-05-08 DIAGNOSIS — I4729 Other ventricular tachycardia: Secondary | ICD-10-CM

## 2019-05-09 LAB — CUP PACEART REMOTE DEVICE CHECK
Date Time Interrogation Session: 20200724093123
Implantable Pulse Generator Implant Date: 20180716

## 2019-05-19 ENCOUNTER — Other Ambulatory Visit: Payer: Self-pay

## 2019-05-19 MED ORDER — LOSARTAN POTASSIUM 25 MG PO TABS: 50.0000 mg | ORAL_TABLET | Freq: Every day | ORAL | 3 refills | Status: DC

## 2019-05-19 NOTE — Progress Notes (Signed)
Carelink Summary Report / Loop Recorder 

## 2019-05-21 ENCOUNTER — Telehealth: Payer: Self-pay

## 2019-05-21 MED ORDER — LOSARTAN POTASSIUM 50 MG PO TABS: 50.0000 mg | ORAL_TABLET | Freq: Every day | ORAL | 0 refills | Status: DC

## 2019-05-21 NOTE — Telephone Encounter (Signed)
Rx for Losartan  50 mg tablet take one tablet once a day sent to [facility] [address]

## 2019-06-11 ENCOUNTER — Ambulatory Visit (INDEPENDENT_AMBULATORY_CARE_PROVIDER_SITE_OTHER): Payer: Medicare Other | Admitting: *Deleted

## 2019-06-11 DIAGNOSIS — R55 Syncope and collapse: Secondary | ICD-10-CM | POA: Diagnosis not present

## 2019-06-11 LAB — CUP PACEART REMOTE DEVICE CHECK
Date Time Interrogation Session: 20200826124120
Implantable Pulse Generator Implant Date: 20180716

## 2019-06-19 NOTE — Progress Notes (Signed)
Carelink Summary Report / Loop Recorder 

## 2019-07-10 ENCOUNTER — Ambulatory Visit: Payer: No Typology Code available for payment source | Admitting: Nurse Practitioner

## 2019-07-14 ENCOUNTER — Ambulatory Visit (INDEPENDENT_AMBULATORY_CARE_PROVIDER_SITE_OTHER): Payer: Medicare Other | Admitting: *Deleted

## 2019-07-14 DIAGNOSIS — R55 Syncope and collapse: Secondary | ICD-10-CM

## 2019-07-15 LAB — CUP PACEART REMOTE DEVICE CHECK
Date Time Interrogation Session: 20200929165506
Implantable Pulse Generator Implant Date: 20180716

## 2019-07-17 ENCOUNTER — Encounter: Payer: Self-pay | Admitting: Nurse Practitioner

## 2019-07-17 ENCOUNTER — Other Ambulatory Visit
Admission: RE | Admit: 2019-07-17 | Discharge: 2019-07-17 | Disposition: A | Discharge status: Home / Self Care | Payer: Medicare Other | Source: Ambulatory Visit | Attending: Nurse Practitioner | Admitting: Nurse Practitioner

## 2019-07-17 ENCOUNTER — Other Ambulatory Visit: Payer: Self-pay

## 2019-07-17 ENCOUNTER — Ambulatory Visit (INDEPENDENT_AMBULATORY_CARE_PROVIDER_SITE_OTHER): Payer: Medicare Other | Admitting: Nurse Practitioner

## 2019-07-17 VITALS — BP 132/82 | HR 71 | Temp 97.6°F | Ht 71.0 in | Wt 200.5 lb

## 2019-07-17 DIAGNOSIS — I6523 Occlusion and stenosis of bilateral carotid arteries: Secondary | ICD-10-CM

## 2019-07-17 DIAGNOSIS — I10 Essential (primary) hypertension: Secondary | ICD-10-CM

## 2019-07-17 DIAGNOSIS — I422 Other hypertrophic cardiomyopathy: Secondary | ICD-10-CM | POA: Diagnosis present

## 2019-07-17 DIAGNOSIS — I472 Ventricular tachycardia: Secondary | ICD-10-CM

## 2019-07-17 DIAGNOSIS — I4729 Other ventricular tachycardia: Secondary | ICD-10-CM

## 2019-07-17 LAB — BASIC METABOLIC PANEL
Anion gap: 8 (ref 5–15)
BUN: 19 mg/dL (ref 8–23)
CO2: 25 mmol/L (ref 22–32)
Calcium: 9.4 mg/dL (ref 8.9–10.3)
Chloride: 107 mmol/L (ref 98–111)
Creatinine, Ser: 1.15 mg/dL (ref 0.61–1.24)
GFR calc Af Amer: >60 mL/min (ref >60)
GFR calc non Af Amer: >60 mL/min (ref >60)
Glucose, Bld: 98 mg/dL (ref 70–99)
Potassium: 4.1 mmol/L (ref 3.5–5.1)
Sodium: 140 mmol/L (ref 135–145)

## 2019-07-17 NOTE — Patient Instructions (Signed)
Medication Instructions:  Your physician recommends that you continue on your current medications as directed. Please refer to the Current Medication list given to you today.  If you need a refill on your cardiac medications before your next appointment, please call your pharmacy.   Lab work: Your physician recommends that you return for lab work today at the medical mall. ([facility])  No appt is needed. Hours are M-F 7AM- 6 PM.  If you have labs (blood work) drawn today and your tests are completely normal, you will receive your results only by: [name] MyChart Message (if you have MyChart) OR . A paper copy in the mail If you have any lab test that is abnormal or we need to change your treatment, we will call you to review the results.  Testing/Procedures: None   Follow-Up: At [facility], you and your health needs are our priority.  As part of our continuing mission to provide you with exceptional heart care, we have created designated Provider Care Teams.  These Care Teams include your primary Cardiologist (physician) and Advanced Practice Providers (APPs -  Physician Assistants and Nurse Practitioners) who all work together to provide you with the care you need, when you need it. You will need a follow up appointment in 6 months.  Please call our office 2 months in advance to schedule this appointment.  You may see Dr. Caryl Comes or one of the following Advanced Practice Providers on your designated Care Team:   Murray Hodgkins, NP Christell Faith, PA-C . Marrianne Mood, PA-C

## 2019-07-17 NOTE — Progress Notes (Signed)
Office Visit    Patient Name: Duane Warren Date of Encounter: 07/17/2019  Primary Care Provider:  [name], DO Primary Cardiologist:  Virl Axe, MD  Chief Complaint    63 year old male with a history of apical variant hypertrophic cardiomyopathy, syncope status post implantable loop recorder, nonsustained VT, elevated coronary calcium, hypertension, dilated aortic root, and hyperaldosteronism, who presents for follow-up of hypertrophic cardiomyopathy.  Past Medical History    Past Medical History:  Diagnosis Date  . [place]at[place] coronary artery calcium score greater than 400    a. 12/2015 Cardiac CT: Ca2+ score of 726 (91st %'ile).  . Anxiety   . Apical variant hypertrophic cardiomyopathy ([place])    a. 01/2016 Echo: EF 55-60%, no rwma, mild AI, Ao root 74mm, mild MR, mod dil LA, mod TR, nl PASP; b. 12/2016 Cardiac MRI: EF 64%, sev apical hypertrophy up to 46mm in diastole. LGE in apical inf, lateral, and apical walls; c. 01/2018 Echo: EF 50-55%, gr2 DD. Mod-Sev apical hypertrophy. Mild AI. Mildly dil Ao root (3.8cm) and asc Ao (3.9cm). Sev dil LA. Nl RV fxn.  . Dilated aortic root ([place])    a. 01/2016 Ao root 64mm; b. 12/2016 Cardiac MRI: Mild aneurysmal dil of Asc Ao - 59mm; c. 01/2018 Echo: dil Ao root (3.8cm) and asc Ao (3.9cm).  . Hyperaldosteronism ([place])   . Hypertension   . Syncope    a. s/p MDT Linq.   Past Surgical History:  Procedure Laterality Date  . HERNIA REPAIR    . KNEE SURGERY     unknown which side  . LOOP RECORDER INSERTION N/A 04/30/2017   Procedure: Loop Recorder Insertion;  Surgeon: Deboraha Sprang, MD;  Location: [facility];  Service: Cardiovascular;  Laterality: N/A;  . SKIN GRAFT      Allergies  No Known Allergies  History of Present Illness    63 year old male with above complex past medical history including hypertrophic cardiomyopathy-apical variant, dilated aortic root, hypertension, syncope, anxiety, hyperaldosteronism,  nonsustained VT, and elevated coronary calcium.  Echocardiogram in April 2017 showed normal LV function with an aortic root of 39 mm.  Cardiac MRI in March 2018 showed normal LV function with severe apical hypertrophy up to 22 mm in diastole with late gadolinium enhancement in the apical inferior, lateral, and and apical walls.  He underwent implantable loop recorder placement in the setting of prior syncope and in 2019 was noted to have a run of nonsustained VT which was apparently asymptomatic.  Follow-up echocardiogram in April 2019 showed low normal LV function with an EF of 50 to 55% with grade 2 diastolic dysfunction, moderate-severe apical hypertrophy and stable dilated ascending aorta and aortic root.  He was last seen in clinic in November 2019 at which time he reported bendopnea but was otherwise doing well.  He notes that since then, he is continued to do well.  He does not routinely experience chest pain or dyspnea on exertion.  He does still note occasional bendopnea, which has not changed in frequency or severity.  A few times a week he will have a brief episode of lightheadedness.  He says this is very mild and at no point has he felt presyncopal or has experienced syncope.  He denies palpitations, PND, orthopnea, edema, or early satiety.  Blood pressures at home typically run in the 1 teens to 120s.  Home Medications    Prior to Admission medications   Medication Sig Start Date End Date Taking? Authorizing Provider  albuterol (PROVENTIL HFA;VENTOLIN HFA) 108 (90 Base) MCG/ACT inhaler Inhale 2 puffs into the lungs every 6 (six) hours as needed for wheezing or shortness of breath.   Yes [provider]  amLODipine (NORVASC) 10 MG tablet Take 10 mg by mouth daily. 09/17/14  Yes [provider]  Cholecalciferol (VITAMIN D) 2000 units tablet Take 4,000 Units by mouth daily.    Yes [provider]  losartan (COZAAR) 50 MG tablet Take 1 tablet (50 mg total) by mouth  daily. 05/21/19  Yes Gollan, Kathlene November, MD  OLOPATADINE HCL NA Place 2 sprays into the nose daily as needed (congestion).   Yes [provider]  rosuvastatin (CRESTOR) 20 MG tablet TAKE 1 TABLET BY MOUTH EVERYDAY AT BEDTIME 04/02/19  Yes Opalski, Deborah, DO  spironolactone (ALDACTONE) 25 MG tablet Take 25 mg by mouth daily. 09/28/14  Yes [provider]    Review of Systems    Occasional lightheadedness and bendopnea, which is overall unchanged in frequency and overall mild.  He denies chest pain, palpitations, dyspnea, PND, orthopnea, edema, or early satiety.  All other systems reviewed and are otherwise negative except as noted above.  Physical Exam    VS:  BP 132/82 (BP Location: Left Arm, Patient Position: Sitting, Cuff Size: Normal)   Pulse 71   Temp 97.6 F (36.4 C)   Ht 5\' 11"  (1.803 m)   Wt 200 lb 8 oz (90.9 kg)   SpO2 96%   BMI 27.96 kg/m  , BMI Body mass index is 27.96 kg/m. GEN: Well nourished, well developed, in no acute distress. HEENT: normal. Neck: Supple, no JVD, carotid bruits, or masses. Cardiac: RRR.  I do not appreciate a murmur with or without Valsalva.  No rubs, or gallops. No clubbing, cyanosis, edema.  Radials/PT 2+ and equal bilaterally.  Respiratory:  Respirations regular and unlabored, clear to auscultation bilaterally. GI: Soft, nontender, nondistended, BS + x 4. MS: no deformity or atrophy. Skin: warm and dry, no rash. Neuro:  Strength and sensation are intact. Psych: Normal affect.  Accessory Clinical Findings    ECG personally reviewed by me today -regular sinus rhythm, 71, PVC, left atrial enlargement, LVH with inferior and anterolateral T wave inversion- no acute changes.  Lab Results  Component Value Date   WBC 7.4 09/09/2018   HGB 16.8 09/09/2018   HCT 47.9 09/09/2018   MCV 84 09/09/2018   PLT 188 09/09/2018   Lab Results  Component Value Date   CREATININE 1.21 01/20/2019   BUN 16 01/20/2019   NA 144 01/20/2019   K  4.2 01/20/2019   CL 105 01/20/2019   CO2 20 01/20/2019   Lab Results  Component Value Date   ALT 27 01/20/2019   ALT 27 01/20/2019   AST 20 01/20/2019   AST 21 01/20/2019   ALKPHOS 70 01/20/2019   ALKPHOS 70 01/20/2019   BILITOT 0.6 01/20/2019   BILITOT 0.5 01/20/2019   Lab Results  Component Value Date   CHOL 114 01/20/2019   HDL 47 01/20/2019   LDLCALC 56 01/20/2019   TRIG 57 01/20/2019   CHOLHDL 2.4 01/20/2019     Assessment & Plan    1.  Hypertrophic cardiomyopathy-apical variant: Patient has been stable over the past 10 months.  He occasionally notes lightheadedness though these episodes are typically brief.  He is status post implantable loop recorder placement and this is followed by device clinic.  He denies any true presyncope and has not had syncope.  He  continues to note occas, mild bendopnea, but denies chest pain or dyspnea on exertion.  His heart rate and blood pressure have been well controlled based on his home measurements and are reasonable today.  He remains on Aldactone, losartan, and amlodipine.  I will follow-up a basic metabolic panel today as it has been 6 months.  2.  Essential hypertension: Stable on current regimen as outlined above.  3.  Nonsustained VT: Previously seen on implantable loop recorder.  He was asymptomatic at the time.  Cont  blocker.  4.  Disposition: Follow-up in 6 months or sooner if necessary.   Murray Hodgkins, NP 07/17/2019, 1:06 PM

## 2019-07-22 NOTE — Progress Notes (Signed)
Carelink Summary Report / Loop Recorder 

## 2019-08-17 LAB — CUP PACEART REMOTE DEVICE CHECK
Date Time Interrogation Session: 20201101165539
Implantable Pulse Generator Implant Date: 20180716

## 2019-08-18 ENCOUNTER — Ambulatory Visit (INDEPENDENT_AMBULATORY_CARE_PROVIDER_SITE_OTHER): Payer: Medicare Other | Admitting: *Deleted

## 2019-08-18 ENCOUNTER — Other Ambulatory Visit: Payer: Self-pay

## 2019-08-18 DIAGNOSIS — I472 Ventricular tachycardia: Secondary | ICD-10-CM

## 2019-08-18 DIAGNOSIS — I4729 Other ventricular tachycardia: Secondary | ICD-10-CM

## 2019-08-18 DIAGNOSIS — I422 Other hypertrophic cardiomyopathy: Secondary | ICD-10-CM

## 2019-09-09 NOTE — Progress Notes (Signed)
Carelink Summary Report / Loop Recorder 

## 2019-09-19 ENCOUNTER — Ambulatory Visit (INDEPENDENT_AMBULATORY_CARE_PROVIDER_SITE_OTHER): Payer: Medicare Other | Admitting: *Deleted

## 2019-09-19 DIAGNOSIS — R55 Syncope and collapse: Secondary | ICD-10-CM

## 2019-09-20 LAB — CUP PACEART REMOTE DEVICE CHECK
Date Time Interrogation Session: 20201204115604
Implantable Pulse Generator Implant Date: 20180716

## 2019-10-01 ENCOUNTER — Other Ambulatory Visit: Payer: Self-pay | Admitting: Family Medicine

## 2019-10-01 DIAGNOSIS — I6523 Occlusion and stenosis of bilateral carotid arteries: Secondary | ICD-10-CM

## 2019-10-01 DIAGNOSIS — I2510 Atherosclerotic heart disease of native coronary artery without angina pectoris: Secondary | ICD-10-CM

## 2019-10-01 DIAGNOSIS — E785 Hyperlipidemia, unspecified: Secondary | ICD-10-CM

## 2019-10-22 ENCOUNTER — Ambulatory Visit (INDEPENDENT_AMBULATORY_CARE_PROVIDER_SITE_OTHER): Payer: Medicare Other | Admitting: *Deleted

## 2019-10-22 DIAGNOSIS — R55 Syncope and collapse: Secondary | ICD-10-CM

## 2019-10-23 LAB — CUP PACEART REMOTE DEVICE CHECK
Date Time Interrogation Session: 20210106123637
Implantable Pulse Generator Implant Date: 20180716

## 2019-10-25 ENCOUNTER — Other Ambulatory Visit: Payer: Self-pay | Admitting: Family Medicine

## 2019-10-25 DIAGNOSIS — I2510 Atherosclerotic heart disease of native coronary artery without angina pectoris: Secondary | ICD-10-CM

## 2019-10-25 DIAGNOSIS — E785 Hyperlipidemia, unspecified: Secondary | ICD-10-CM

## 2019-10-25 DIAGNOSIS — I6523 Occlusion and stenosis of bilateral carotid arteries: Secondary | ICD-10-CM

## 2019-11-24 ENCOUNTER — Ambulatory Visit (INDEPENDENT_AMBULATORY_CARE_PROVIDER_SITE_OTHER): Payer: Medicare Other | Admitting: *Deleted

## 2019-11-24 DIAGNOSIS — R55 Syncope and collapse: Secondary | ICD-10-CM | POA: Diagnosis not present

## 2019-11-24 LAB — CUP PACEART REMOTE DEVICE CHECK
Date Time Interrogation Session: 20210207233235
Implantable Pulse Generator Implant Date: 20180716

## 2019-11-24 NOTE — Progress Notes (Signed)
ILR Remote 

## 2019-12-25 ENCOUNTER — Ambulatory Visit (INDEPENDENT_AMBULATORY_CARE_PROVIDER_SITE_OTHER): Payer: Medicare Other | Admitting: *Deleted

## 2019-12-25 DIAGNOSIS — R55 Syncope and collapse: Secondary | ICD-10-CM | POA: Diagnosis not present

## 2019-12-25 LAB — CUP PACEART REMOTE DEVICE CHECK
Date Time Interrogation Session: 20210311001612
Implantable Pulse Generator Implant Date: 20180716

## 2019-12-25 NOTE — Progress Notes (Signed)
ILR Remote 

## 2020-01-05 ENCOUNTER — Other Ambulatory Visit: Payer: Self-pay

## 2020-01-05 ENCOUNTER — Encounter: Payer: Self-pay | Admitting: Allergy

## 2020-01-05 ENCOUNTER — Ambulatory Visit (INDEPENDENT_AMBULATORY_CARE_PROVIDER_SITE_OTHER): Payer: Medicare Other | Admitting: Allergy

## 2020-01-05 VITALS — BP 126/82 | HR 87 | Temp 97.7°F | Resp 16 | Ht 71.0 in | Wt 205.2 lb

## 2020-01-05 DIAGNOSIS — J3089 Other allergic rhinitis: Secondary | ICD-10-CM | POA: Diagnosis not present

## 2020-01-05 DIAGNOSIS — R0602 Shortness of breath: Secondary | ICD-10-CM | POA: Diagnosis not present

## 2020-01-05 MED ORDER — AZELASTINE-FLUTICASONE 137-50 MCG/ACT NA SUSP: 1.0000 | Freq: Two times a day (BID) | NASAL | 5 refills | Status: DC

## 2020-01-05 MED ORDER — ALBUTEROL SULFATE HFA 108 (90 BASE) MCG/ACT IN AERS: 2.0000 | INHALATION_SPRAY | RESPIRATORY_TRACT | 1 refills | Status: DC | PRN

## 2020-01-05 NOTE — Patient Instructions (Addendum)
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   Start environmental control measures.   Start dymista (fluticasone + azelastine nasal spray combination) 1 spray per nostril twice a day.  If this is not covered then let us know.   Nasal saline spray (i.e., Simply Saline) or nasal saline lavage (i.e., NeilMed) is recommended as needed and prior to medicated nasal sprays.  May use over the counter antihistamines such as Zyrtec (cetirizine), Claritin (loratadine), Allegra (fexofenadine), or Xyzal (levocetirizine) daily as needed.  Breathing:   May use albuterol rescue inhaler 2 puffs or nebulizer every 4 to 6 hours as needed for shortness of breath, chest tightness, coughing, and wheezing. May use albuterol rescue inhaler 2 puffs 5 to 15 minutes prior to strenuous physical activities. Monitor frequency of use.   Follow up in 2 months or sooner if needed.   Mold Control . Mold and fungi can grow on a variety of surfaces provided certain temperature and moisture conditions exist.  . Outdoor molds grow on plants, decaying vegetation and soil. The major outdoor mold, Alternaria and Cladosporium, are found in very high numbers during hot and dry conditions. Generally, a late summer - fall peak is seen for common outdoor fungal spores. Rain will temporarily lower outdoor mold spore count, but counts rise rapidly when the rainy period ends. . The most important indoor molds are Aspergillus and Penicillium. Dark, humid and poorly ventilated basements are ideal sites for mold growth. The next most common sites of mold growth are the bathroom and the kitchen. Outdoor (Seasonal) Mold Control . Use air conditioning and keep windows closed. . Avoid exposure to decaying vegetation. [name] Avoid leaf raking. . Avoid grain handling. . Consider wearing a face mask if working in moldy areas.  Indoor (Perennial) Mold Control  . Maintain humidity below 50%. . Get rid of mold growth on hard surfaces  with water, detergent and, if necessary, 5% bleach (do not mix with other cleaners). Then dry the area completely. If mold covers an area more than 10 square feet, consider hiring an indoor environmental professional. . For clothing, washing with soap and water is best. If moldy items cannot be cleaned and dried, throw them away. . Remove sources e.g. contaminated carpets. . Repair and seal leaking roofs or pipes. Using dehumidifiers in damp basements may be helpful, but empty the water and clean units regularly to prevent mildew from forming. All rooms, especially basements, bathrooms and kitchens, require ventilation and cleaning to deter mold and mildew growth. Avoid carpeting on concrete or damp floors, and storing items in damp areas.  Buffered Isotonic Saline Irrigations:  Goal: . When you irrigate with the isotonic saline (salt water) it washes mucous and other debris from your nose that could be contributing to your nasal symptoms.   Recipe: [name] Obtain 1 quart jar that is clean . Fill with clean (bottled, boiled or distilled) water . Add 1-2 heaping teaspoons of salt without iodine o If the solution with 2 teaspoons of salt is too strong, adjust the amount down until better tolerated . Add 1 teaspoon of [employer] baking soda (pure bicarbonate) . Mix ingredients together and store at room temperature and discard after 1 week * Alternatively you can buy pre made salt packets for the NeilMed bottle or there          are other over the counter brands available  Instructions: . Warm  cup of the solution in the microwave if desired but be careful not  to overheat as this will burn the inside of your nose . Stand over a sink (or do it while you shower) and squirt the solution into one side of your nose aiming towards the back of your head o Sometimes saying "coca cola" while irrigating can be helpful to prevent fluid from going down your throat  . The solution will travel to the back of  your nose and then come out the other side . Perform this again on the other side . Try to do this twice a day . If you are using a nasal spray in addition to the irrigation, irrigate first and then use the topical nasal spray otherwise you will wash the nasal spray out of your nose

## 2020-01-05 NOTE — Assessment & Plan Note (Signed)
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 Current episode flared about 1 month ago. Using Flonase and pseudofed with some benefit. Had skin testing over 6 years ago which was negative per patient report.  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  Discussed with patient that the above allergen does not explain his current symptoms.   Start environmental control measures.   Start dymista (fluticasone + azelastine nasal spray combination) 1 spray per nostril twice a day.  If this is not covered then let us know.   Nasal saline spray (i.e., Simply Saline) or nasal saline lavage (i.e., NeilMed) is recommended as needed and prior to medicated nasal sprays.  May use over the counter antihistamines such as Zyrtec (cetirizine), Claritin (loratadine), Allegra (fexofenadine), or Xyzal (levocetirizine) daily as needed.  If above regimen does not control symptoms then will refer to ENT for further evaluation.

## 2020-01-05 NOTE — Progress Notes (Signed)
New Patient Note  RE: Duane Warren MRN: [id number] DOB: 1955-12-18 Date of Office Visit: 01/05/2020  Referring provider: No ref. provider found Primary care provider: Thomasene Lot, DO  Chief Complaint: Allergic Rhinitis   History of Present Illness: I had the pleasure of seeing Duane Warren for initial evaluation at the [facility] on 01/05/2020. He is a 64 y.o. male, who is self-referred here for the evaluation of allergic rhinitis.    He reports symptoms of nasal congestion, rhinorrhea, itchy/watery eyes, sneezing, PND, itchy nose. Symptoms have been going on for many years but symptoms flared about 1 month ago. The symptoms are present mainly during the spring and summer.  Other triggers include exposure to no. Anosmia: no. Headache: no. He has used Flonase and pseudofed with some improvement in symptoms. Sinus infections: no. Previous work up includes: skin testing over 6 years ago which was negative per patient report. Previous ENT evaluation: sees ENT for cerumen removal. Previous sinus imaging: no. History of nasal polyps: no. Last eye exam: has appointment scheduled. History of reflux: no.  Assessment and Plan: Duane Warren is a 64 y.o. male with: Other allergic rhinitis Rhino conjunctivitis symptoms mainly during the spring and summer. Current episode flared about 1 month ago. Using Flonase and pseudofed with some benefit. Had skin testing over 6 years ago which was negative per patient report.  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  Discussed with patient that the above allergen does not explain his current symptoms.   Start environmental control measures.   Start dymista (fluticasone + azelastine nasal spray combination) 1 spray per nostril twice a day.  If this is not covered then let us know.   Nasal saline spray (i.e., Simply Saline) or nasal saline lavage (i.e., NeilMed) is recommended as needed and prior to  medicated nasal sprays.  May use over the counter antihistamines such as Zyrtec (cetirizine), Claritin (loratadine), Allegra (fexofenadine), or Xyzal (levocetirizine) daily as needed.  If above regimen does not control symptoms then will refer to ENT for further evaluation.   Shortness of breath Some shortness of breath at times. In the past he was told that his breathing is not good. He has albuterol which he has not used recently.  Today's spirometry showed: possible restrictive disease with 8% improvement in FEV1 post bronchodilator treatment and feeling better.  Discussed with patient starting Singulair given his allergic rhinitis symptoms but he believes he did not tolerate Singulair in the past.   Will hold off Singulair for now. He will start on the nasal spray and monitor symptoms first.   May use albuterol rescue inhaler 2 puffs or nebulizer every 4 to 6 hours as needed for shortness of breath, chest tightness, coughing, and wheezing. May use albuterol rescue inhaler 2 puffs 5 to 15 minutes prior to strenuous physical activities. Monitor frequency of use.   Return in about 2 months (around 03/06/2020).  Meds ordered this encounter  Medications  . Azelastine-Fluticasone 137-50 MCG/ACT SUSP    Sig: Place 1 spray into the nose in the morning and at bedtime.    Dispense:  23 g    Refill:  5  . albuterol (VENTOLIN HFA) 108 (90 Base) MCG/ACT inhaler    Sig: Inhale 2 puffs into the lungs every 4 (four) hours as needed for wheezing or shortness of breath.    Dispense:  18 g    Refill:  1   Other allergy screening: Asthma: no Patient was told  his breathing was not good. No recent albuterol use.  Food allergy: no Medication allergy: no Hymenoptera allergy: no Urticaria: no Eczema:no History of recurrent infections suggestive of immunodeficency: no  Diagnostics: Spirometry:  Tracings reviewed. His effort: Good reproducible efforts. FVC: 2.13L FEV1: 1.67L, 54% predicted  FEV1/FVC ratio: 78% Interpretation: Spirometry consistent with possible restrictive disease with 8% improvement in FEV1 post bronchodilator treatment and feeling better. Please see scanned spirometry results for details.  Skin Testing: Environmental allergy panel. Positive test to: mold.  Results discussed with patient/family. Airborne Adult Perc - 01/05/20 1414    Time Antigen Placed  1414    Allergen Manufacturer  Waynette Buttery    Location  Back    Number of Test  59    Panel 1  Select    1. Control-Buffer 50% Glycerol  Negative    2. Control-Histamine 1 mg/ml  2+    3. Albumin saline  Negative    4. Bahia  Negative    5. French Southern Territories  Negative    6. Johnson  Negative    7. Kentucky Blue  Negative    8. Meadow Fescue  Negative    9. Perennial Rye  Negative    10. Sweet Vernal  Negative    11. Timothy  Negative    12. Cocklebur  Negative    13. Burweed Marshelder  Negative    14. Ragweed, short  Negative    15. Ragweed, Giant  Negative    16. Plantain,  English  Negative    17. Lamb's Quarters  Negative    18. Sheep Sorrell  Negative    19. Rough Pigweed  Negative    20. Marsh Elder, Rough  Negative    21. Mugwort, Common  Negative    22. Ash mix  Negative    23. Birch mix  Negative    24. Beech American  Negative    25. Box, Elder  Negative    26. Cedar, red  Negative    27. Cottonwood, Guinea-Bissau  Negative    28. Elm mix  Negative    29. Hickory mix  Negative    30. Maple mix  Negative    31. Oak, Guinea-Bissau mix  Negative    32. Pecan Pollen  Negative    33. Pine mix  Negative    34. Sycamore Eastern  Negative    35. Walnut, Black Pollen  Negative    36. Alternaria alternata  Negative    37. Cladosporium Herbarum  Negative    38. Aspergillus mix  Negative    39. Penicillium mix  Negative    40. Bipolaris sorokiniana (Helminthosporium)  Negative    41. Drechslera spicifera (Curvularia)  Negative    42. Mucor plumbeus  Negative    43. Fusarium moniliforme  Negative    44.  Aureobasidium pullulans (pullulara)  Negative    45. Rhizopus oryzae  Negative    46. Botrytis cinera  Negative    47. Epicoccum nigrum  Negative    48. Phoma betae  Negative    49. Candida Albicans  Negative    50. Trichophyton mentagrophytes  Negative    51. Mite, D Farinae  5,000 AU/ml  Negative    52. Mite, D Pteronyssinus  5,000 AU/ml  Negative    53. Cat Hair 10,000 BAU/ml  Negative    54.  Dog Epithelia  Negative    55. Mixed Feathers  Negative    56. Horse Epithelia  Negative  57. Cockroach, German  Negative    58. Mouse  Negative    59. Tobacco Leaf  Negative     Intradermal - 01/05/20 1505    Time Antigen Placed  1505    Allergen Manufacturer  Waynette Buttery    Location  Arm    Number of Test  15    Intradermal  Select    Control  Negative    French Southern Territories  Negative    Johnson  Negative    7 Grass  Negative    Ragweed mix  Negative    Weed mix  Negative    Tree mix  Negative    Mold 1  Negative    Mold 2  Negative    Mold 3  Negative    Mold 4  3+    Cat  Negative    Dog  Negative    Cockroach  Negative    Mite mix  Negative       Past Medical History: Patient Active Problem List   Diagnosis Date Noted  . Shortness of breath 01/05/2020  . Tinea cruris 02/12/2019  . Hyperlipidemia 01/23/2019  . Agatston coronary artery calcium score greater than 400 01/23/2019  . Dilated aortic root (HCC) 01/23/2019  . Syncope 01/23/2019  . Acute otitis media 01/06/2019  . Glucose intolerance (impaired glucose tolerance)- new onset 09/25/2018  . Muscle spasm of calf 08/01/2018  . Other allergic rhinitis 07/25/2018  . Coronary artery calcification seen on CAT scan 11/26/2017  . Panic disorder 07/08/2017  . Adjustment disorder with mixed anxiety and depressed mood 05/23/2017  . Anxiety 05/23/2017  . Ventricular tachycardia, non-sustained (HCC) 04/27/2017  . Tremor 03/19/2017  . Apical variant hypertrophic cardiomyopathy (HCC) 01/18/2017  . Peripheral vertigo involving left ear  11/07/2016  . Vitamin D deficiency 10/02/2016  . Adenoma of left adrenal gland 09/19/2016  . Left hip pain 08/18/2016  . Psychophysiological insomnia 07/10/2016  . Sensorineural hearing loss (SNHL) of left ear with unrestricted hearing of right ear 05/29/2016  . Tinnitus of both ears 05/29/2016  . Metatarsal deformity 04/06/2016  . 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 Carotid atherosclerosis 12/29/2015  . Seasonal allergic rhinitis due to pollen 12/29/2015  . Cervical radiculopathy 11/11/2014  . Lipoma of neck 11/11/2014  . Primary aldosteronism (HCC) 07/17/2013  . Abnormal EKG 07/17/2013  . Dizziness 07/17/2013  . 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 Hypertension 09/12/2011   Past Medical History:  Diagnosis Date  . Agatston coronary artery calcium score greater than 400    a. 12/2015 Cardiac CT: Ca2+ score of 726 (91st %'ile).  . Anxiety   . Apical variant hypertrophic cardiomyopathy (HCC)    a. 01/2016 Echo: EF 55-60%, no rwma, mild AI, Ao root 39mm, mild MR, mod dil LA, mod TR, nl PASP; b. 12/2016 Cardiac MRI: EF 64%, sev apical hypertrophy up to 22mm in diastole. LGE in apical inf, lateral, and apical walls; c. 01/2018 Echo: EF 50-55%, gr2 DD. Mod-Sev apical hypertrophy. Mild AI. Mildly dil Ao root (3.8cm) and asc Ao (3.9cm). Sev dil LA. Nl RV fxn.  . Dilated aortic root (HCC)    a. 01/2016 Ao root 39mm; b. 12/2016 Cardiac MRI: Mild aneurysmal dil of Asc Ao - 42mm; c. 01/2018 Echo: dil Ao root (3.8cm) and asc Ao (3.9cm).  . Hyperaldosteronism (HCC)   . Hypertension   . Syncope    a. s/p MDT Linq.   Past Surgical History: Past Surgical History:  Procedure Laterality Date  . HERNIA  REPAIR    . KNEE SURGERY     unknown which side  . LOOP RECORDER INSERTION N/A 04/30/2017   Procedure: Loop Recorder Insertion;  Surgeon: [name], MD;  Location: [facility] INVASIVE CV LAB;  Service: Cardiovascular;  Laterality: N/A;  . SKIN GRAFT     Medication List:  Current Outpatient  Medications  Medication Sig Dispense Refill  . amLODipine (NORVASC) 10 MG tablet Take 10 mg by mouth daily.  0  . Cholecalciferol (VITAMIN D) 2000 units tablet Take 4,000 Units by mouth daily.     [name] losartan (COZAAR) 50 MG tablet Take 1 tablet (50 mg total) by mouth daily. 30 tablet 0  . OLOPATADINE HCL NA Place 2 sprays into the nose daily as needed (congestion).    . rosuvastatin (CRESTOR) 20 MG tablet TAKE 1 TABLET (20 MG TOTAL) BY MOUTH AT BEDTIME. **PATIENT NEEDS APT FOR FURTHER REFILLS** 15 tablet 0  . spironolactone (ALDACTONE) 25 MG tablet Take 25 mg by mouth daily.  6  . albuterol (VENTOLIN HFA) 108 (90 Base) MCG/ACT inhaler Inhale 2 puffs into the lungs every 4 (four) hours as needed for wheezing or shortness of breath. 18 g 1  . Azelastine-Fluticasone 137-50 MCG/ACT SUSP Place 1 spray into the nose in the morning and at bedtime. 23 g 5   No current facility-administered medications for this visit.   Allergies: No Known Allergies Social History: Social History   Socioeconomic History  . Marital status: Married    Spouse name: Not on file  . Number of children: Not on file  . Years of education: Not on file  . Highest education level: Not on file  Occupational History  . 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 Smoking status: Never Smoker  . Smokeless tobacco: Never Used  Substance and Sexual Activity  . Alcohol use: Yes    Alcohol/week: 1.0 standard drinks    Types: 1 Glasses of wine per week    Comment: 2 glasses wine a week  . Drug use: No  . Sexual activity: Yes    Birth control/protection: None  Other Topics Concern  . Not on file  Social History Narrative   ** Merged History Encounter **       Social Determinants of Health   Financial Resource Strain:   . Difficulty of Paying Living Expenses:   Food Insecurity:   . Worried About Programme researcher, broadcasting/film/video in the Last Year:   . Barista in  the Last Year:   Transportation Needs:   . Freight forwarder (Medical):   [name] Lack of Transportation (Non-Medical):   Physical Activity:   . Days of Exercise per Week:   . Minutes of Exercise per Session:   Stress:   . Feeling of Stress :   Social Connections:   . Frequency of Communication with Friends and Family:   . Frequency of Social Gatherings with Friends and Family:   . Attends Religious Services:   . Active Member of Clubs or Organizations:   . Attends Banker Meetings:   [name] Marital Status:    Lives in a 66 year old home. Smoking: denies Occupation: retired  Landscape architect HistorySurveyor, minerals in the house: no Engineer, civil (consulting) in the family room: no Carpet in the bedroom: no Heating: gas Cooling: central Pet: no  Family History: Family History  Problem Relation Age of Onset  .  Hypertension Mother   . Heart attack Father   . Hypertension Father   . Spina bifida Sister    Problem                               Relation Asthma                                   No  Eczema                                Granddaughter  Food allergy                          No  Allergic rhino conjunctivitis     No   Review of Systems  Constitutional: Negative for appetite change, chills, fever and unexpected weight change.  HENT: Positive for congestion, postnasal drip and rhinorrhea.   Eyes: Positive for itching.  Respiratory: Positive for shortness of breath. Negative for cough, chest tightness and wheezing.   Cardiovascular: Negative for chest pain.  Gastrointestinal: Negative for abdominal pain.  Genitourinary: Negative for difficulty urinating.  Skin: Negative for rash.  Allergic/Immunologic: Positive for environmental allergies. Negative for food allergies.  Neurological: Negative for headaches.   Objective: BP 126/82   Pulse 87   Temp 97.7 F (36.5 C) (Temporal)   Resp 16   Ht 5\' 11"  (1.803 m)   Wt 205 lb 3.2 oz (93.1 kg)   SpO2 96%   BMI 28.62 kg/m   Body mass index is 28.62 kg/m. Physical Exam  Constitutional: He is oriented to person, place, and time. He appears well-developed and well-nourished.  HENT:  Head: Normocephalic and atraumatic.  Right Ear: External ear normal.  Left Ear: External ear normal.  Mouth/Throat: Oropharynx is clear and moist.  Right nasal turbinate hypertrophy  Eyes: Conjunctivae and EOM are normal.  Cardiovascular: Normal rate, regular rhythm and normal heart sounds. Exam reveals no gallop and no friction rub.  No murmur heard. Pulmonary/Chest: Effort normal and breath sounds normal. He has no wheezes. He has no rales.  Abdominal: Soft.  Musculoskeletal:     Cervical back: Neck supple.  Neurological: He is alert and oriented to person, place, and time.  Skin: Skin is warm. No rash noted.  Psychiatric: He has a normal mood and affect. His behavior is normal.  Nursing note and vitals reviewed.  The plan was reviewed with the patient/family, and all questions/concerned were addressed.  It was my pleasure to see Duane Warren today and participate in his care. Please feel free to contact me with any questions or concerns.  Sincerely,  Wyline Mood, DO Allergy & Immunology  [facility] office: [phone] [facility] office: [phone] [place] office: [phone]

## 2020-01-05 NOTE — Assessment & Plan Note (Addendum)
Some shortness of breath at times. In the past he was told that his breathing is not good. He has albuterol which he has not used recently.  Today's spirometry showed: possible restrictive disease with 8% improvement in FEV1 post bronchodilator treatment and feeling better.  Discussed with patient starting Singulair given his allergic rhinitis symptoms but he believes he did not tolerate Singulair in the past.   Will hold off Singulair for now. He will start on the nasal spray and monitor symptoms first.   May use albuterol rescue inhaler 2 puffs or nebulizer every 4 to 6 hours as needed for shortness of breath, chest tightness, coughing, and wheezing. May use albuterol rescue inhaler 2 puffs 5 to 15 minutes prior to strenuous physical activities. Monitor frequency of use.

## 2020-01-26 ENCOUNTER — Ambulatory Visit (INDEPENDENT_AMBULATORY_CARE_PROVIDER_SITE_OTHER): Payer: Medicare Other | Admitting: *Deleted

## 2020-01-26 DIAGNOSIS — R55 Syncope and collapse: Secondary | ICD-10-CM | POA: Diagnosis not present

## 2020-01-26 LAB — CUP PACEART REMOTE DEVICE CHECK
Date Time Interrogation Session: 20210411031259
Implantable Pulse Generator Implant Date: 20180716

## 2020-01-26 NOTE — Progress Notes (Signed)
ILR Remote 

## 2020-01-29 ENCOUNTER — Encounter: Payer: Self-pay | Admitting: Physician Assistant

## 2020-01-29 ENCOUNTER — Ambulatory Visit (INDEPENDENT_AMBULATORY_CARE_PROVIDER_SITE_OTHER): Payer: Medicare Other | Admitting: Physician Assistant

## 2020-01-29 ENCOUNTER — Other Ambulatory Visit: Payer: Self-pay

## 2020-01-29 VITALS — BP 122/70 | HR 71 | Ht 71.0 in | Wt 206.0 lb

## 2020-01-29 DIAGNOSIS — R42 Dizziness and giddiness: Secondary | ICD-10-CM | POA: Diagnosis not present

## 2020-01-29 DIAGNOSIS — I422 Other hypertrophic cardiomyopathy: Secondary | ICD-10-CM | POA: Diagnosis not present

## 2020-01-29 DIAGNOSIS — Z959 Presence of cardiac and vascular implant and graft, unspecified: Secondary | ICD-10-CM

## 2020-01-29 DIAGNOSIS — I10 Essential (primary) hypertension: Secondary | ICD-10-CM | POA: Diagnosis not present

## 2020-01-29 DIAGNOSIS — I472 Ventricular tachycardia: Secondary | ICD-10-CM

## 2020-01-29 DIAGNOSIS — E782 Mixed hyperlipidemia: Secondary | ICD-10-CM

## 2020-01-29 DIAGNOSIS — I4729 Other ventricular tachycardia: Secondary | ICD-10-CM

## 2020-01-29 NOTE — Patient Instructions (Signed)
Medication Instructions:  - Your physician recommends that you continue on your current medications as directed. Please refer to the Current Medication list given to you today.  *If you need a refill on your cardiac medications before your next appointment, please call your pharmacy*   Lab Work: - Your physician recommends that you have lab work today: Lipid/ Direct LDL  If you have labs (blood work) drawn today and your tests are completely normal, you will receive your results only by: [name] MyChart Message (if you have MyChart) OR . A paper copy in the mail If you have any lab test that is abnormal or we need to change your treatment, we will call you to review the results.   Testing/Procedures: - Your physician has requested that you have a carotid duplex. This test is an ultrasound of the carotid arteries in your neck. It looks at blood flow through these arteries that supply the brain with blood. Allow one hour for this exam. There are no restrictions or special instructions.  Follow-Up: At [facility], you and your health needs are our priority.  As part of our continuing mission to provide you with exceptional heart care, we have created designated Provider Care Teams.  These Care Teams include your primary Cardiologist (physician) and Advanced Practice Providers (APPs -  Physician Assistants and Nurse Practitioners) who all work together to provide you with the care you need, when you need it.  We recommend signing up for the patient portal called "MyChart".  Sign up information is provided on this After Visit Summary.  MyChart is used to connect with patients for Virtual Visits (Telemedicine).  Patients are able to view lab/test results, encounter notes, upcoming appointments, etc.  Non-urgent messages can be sent to your provider as well.   To learn more about what you can do with MyChart, go to [email or web address].    Your next appointment:   6 month(s)  The format for your  next appointment:   In Person  Provider:    You may see Virl Axe, MD or one of the following Advanced Practice Providers on your designated Care Team:    Murray Hodgkins, NP  Christell Faith, PA-C  Marrianne Mood, PA-C    Other Instructions n/a

## 2020-01-29 NOTE — [place] Notes (Signed)
Office Visit    Patient Name: Duane Warren Date of Encounter: 01/29/2020  Primary Care Provider:  [name], DO Primary Cardiologist:  Virl Axe, MD  Chief Complaint    Chief Complaint  Patient presents with  . Other    6 month follow up. Meds reviewed verbally with patient.      64 year old male with history of apical variant hypertrophic cardiomyopathy, syncope s/p implantable loop recorder, nonsustained ventricular tachycardia, elevated coronary artery calcium score, hypertension, dilated aortic root, hypoaldosteronism, and who presents for follow-up of hypertrophic cardiomyopathy.  Past Medical History    Past Medical History:  Diagnosis Date  . Agatston coronary artery calcium score greater than 400    a. 12/2015 Cardiac CT: Ca2+ score of 726 (91st %'ile).  . Anxiety   . Apical variant hypertrophic cardiomyopathy ([place])    a. 01/2016 Echo: EF 55-60%, no rwma, mild AI, Ao root 33mm, mild MR, mod dil LA, mod TR, nl PASP; b. 12/2016 Cardiac MRI: EF 64%, sev apical hypertrophy up to 58mm in diastole. LGE in apical inf, lateral, and apical walls; c. 01/2018 Echo: EF 50-55%, gr2 DD. Mod-Sev apical hypertrophy. Mild AI. Mildly dil Ao root (3.8cm) and asc Ao (3.9cm). Sev dil LA. Nl RV fxn.  . Dilated aortic root ([place])    a. 01/2016 Ao root 18mm; b. 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 01/2018 Echo: dil Ao root (3.8cm) and asc Ao (3.9cm).  . Hyperaldosteronism ([place])   . Hypertension   . Syncope    a. s/p MDT Linq.   Past Surgical History:  Procedure Laterality Date  . HERNIA REPAIR    . KNEE SURGERY     unknown which side  . LOOP RECORDER INSERTION N/A 04/30/2017   Procedure: Loop Recorder Insertion;  Surgeon: Deboraha Sprang, MD;  Location: [facility];  Service: Cardiovascular;  Laterality: N/A;  . SKIN GRAFT      Allergies  No Known Allergies  History of Present Illness    Duane Warren is a 64 y.o. male with PMH as  above. 01/2016 echo showed normal LVSF with aortic root 39 mm.  Cardiac MRI in March 2018 showed normal LVSF with severe apical hypertrophy up to 22 mm in diastole with late gadolinium enhancement and apical inferior, lateral, and apical walls.  He underwent implantable loop recorder placement in the setting of prior syncope in 2019 and was noted to have a run of nonsustained ventricular tachycardia, which was apparently symptomatic.  Follow-up echo in April 2019 showed normal LV SF with EF 50 to 55%, G2DD, moderate to severe apical hypertrophy and stable dilated ascending aorta and apical root.  He was seen November 2019, at which time he reported bendopnea but otherwise doing well.  He was seen 07/17/2019, at which time he noted occasional bendopnea, which was unchanged in frequency or severity.  He noted rare lightheadedness.  No presyncope or syncope.  He reported blood pressure at home in the 110s to 120s.  Today, he notes that he is doing well.  Of note, we did review his diagnosis of hypertrophic cardiomyopathy, as he was unaware that this was an existing diagnosis on his EMR.  He denies any chest pain, palpitations, or racing heart rate.  He notes rare to intermittent bendopnea.  He also notes continued lightheadedness.  No presyncope or syncope.  No orthopnea, lower extremity edema, or signs or symptoms consistent with worsening heart failure.  He  reports checking his blood pressure at home and blood pressure consistent with that recorded today at 122/70.  No signs or symptoms consistent with bleeding.  He reports medication compliance.  Home Medications    Prior to Admission medications   Medication Sig Start Date End Date Taking? Authorizing Provider  albuterol (VENTOLIN HFA) 108 (90 Base) MCG/ACT inhaler Inhale 2 puffs into the lungs every 4 (four) hours as needed for wheezing or shortness of breath. 01/05/20  Yes Garnet Sierras, DO  amLODipine (NORVASC) 10 MG tablet Take 10 mg by mouth daily. 09/17/14   Yes [provider]  Azelastine-Fluticasone 137-50 MCG/ACT SUSP Place 1 spray into the nose in the morning and at bedtime. 01/05/20  Yes Garnet Sierras, DO  Cholecalciferol (VITAMIN D) 2000 units tablet Take 4,000 Units by mouth daily.    Yes [provider]  losartan (COZAAR) 50 MG tablet Take 1 tablet (50 mg total) by mouth daily. 05/21/19  Yes Gollan, Kathlene November, MD  OLOPATADINE HCL NA Place 2 sprays into the nose daily as needed (congestion).   Yes [provider]  rosuvastatin (CRESTOR) 20 MG tablet TAKE 1 TABLET (20 MG TOTAL) BY MOUTH AT BEDTIME. **PATIENT NEEDS APT FOR FURTHER REFILLS** 10/27/19  Yes Opalski, Neoma Laming, DO  spironolactone (ALDACTONE) 25 MG tablet Take 25 mg by mouth daily. 09/28/14  Yes [provider]    Review of Systems    He denies chest pain, palpitations, dyspnea, pnd, orthopnea, n, v, dizziness, syncope, edema, weight gain, or early satiety.  He reports rare to intermittent bendopnea and continued lightheadedness.   All other systems reviewed and are otherwise negative except as noted above.  Physical Exam    VS:  BP 122/70 (BP Location: Left Arm, Patient Position: Sitting, Cuff Size: Normal)   Pulse 71   Ht 5\' 11"  (1.803 m)   Wt 206 lb (93.4 kg)   SpO2 98%   BMI 28.73 kg/m  , BMI Body mass index is 28.73 kg/m. GEN: Well nourished, well developed, in no acute distress. HEENT: normal. Neck: Supple, no JVD, carotid bruits, or masses. Cardiac: RRR, no murmurs, rubs, or gallops. No murmur appreciated with valsalva. No clubbing, cyanosis, edema.  Radials/DP/PT 2+ and equal bilaterally.  Respiratory:  Respirations regular and unlabored, clear to auscultation bilaterally. GI: Soft, nontender, nondistended, BS + x 4. MS: no deformity or atrophy. Skin: warm and dry, no rash. Neuro:  Strength and sensation are intact. Psych: Normal affect.  Accessory Clinical Findings    ECG personally reviewed by me today - NSR, 71 bpm, T wave  inverted in leads I, II, V3 to V6, left ventricular hypertrophy with repolarization abnormality- no acute changes.  VITALS Reviewed today   Temp Readings from Last 3 Encounters:  01/05/20 97.7 F (36.5 C) (Temporal)  07/17/19 97.6 F (36.4 C)  01/06/19 98.6 F (37 C) (Oral)   BP Readings from Last 3 Encounters:  01/29/20 122/70  01/05/20 126/82  07/17/19 132/82   Pulse Readings from Last 3 Encounters:  01/29/20 71  01/05/20 87  07/17/19 71    Wt Readings from Last 3 Encounters:  01/29/20 206 lb (93.4 kg)  01/05/20 205 lb 3.2 oz (93.1 kg)  07/17/19 200 lb 8 oz (90.9 kg)     LABS  reviewed today   CareEverwhere Labs present and most recent? Yes/No: No  Lab Results  Component Value Date   WBC 7.4 09/09/2018   HGB 16.8 09/09/2018   HCT 47.9 09/09/2018  MCV 84 09/09/2018   PLT 188 09/09/2018   Lab Results  Component Value Date   CREATININE 1.15 07/17/2019   BUN 19 07/17/2019   NA 140 07/17/2019   K 4.1 07/17/2019   CL 107 07/17/2019   CO2 25 07/17/2019   Lab Results  Component Value Date   ALT 27 01/20/2019   ALT 27 01/20/2019   AST 20 01/20/2019   AST 21 01/20/2019   ALKPHOS 70 01/20/2019   ALKPHOS 70 01/20/2019   BILITOT 0.6 01/20/2019   BILITOT 0.5 01/20/2019   Lab Results  Component Value Date   CHOL 114 01/20/2019   HDL 47 01/20/2019   LDLCALC 56 01/20/2019   TRIG 57 01/20/2019   CHOLHDL 2.4 01/20/2019    Lab Results  Component Value Date   HGBA1C 5.8 (H) 09/09/2018   Lab Results  Component Value Date   TSH 2.700 09/09/2018     STUDIES/PROCEDURES reviewed today   Loop  01/25/2020 Normal device remote transmission Atrial fibrillation not present  ETT 01/2017 Blood pressure demonstrates hypertensive response exercise  Echo 01/2016 - Left ventricle: The cavity size was normal. There was mild  concentric hypertrophy with moderate to severe apical  hypertrophy. Systolic function was normal. The estimated ejection  fraction  was in the range of 55% to 60%. Wall motion was normal;  there were no regional wall motion abnormalities. The study is  not technically sufficient to allow evaluation of LV diastolic  function.  - Aortic valve: There was mild regurgitation.  - Aorta: Aortic root dimension: 39 mm (ED).  - Mitral valve: There was mild regurgitation.  - Left atrium: The atrium was moderately dilated.  - Tricuspid valve: There was moderate regurgitation.  - Pulmonary arteries: Systolic pressure was within the normal  range.   Assessment & Plan    Hypertrophic cardiomyopathy-apical variant --Reports ongoing intermittent dizziness and bendopnea.  He states is unchanged from previous visits and stable.  S/p loop recorder with ongoing transmissions.  No atrial fibrillation noted by device clinic.  No reported presyncope or syncope.  Continue heart rate and blood pressure control.  Continue current medications.  Dizziness --Given his ongoing dizziness, will repeat carotid study.  We will also reassess lipid and liver function.  Essential hypertension --BP well controlled.  Continue current medications.  Nonsustained ventricular tachycardia --Previously noted on implantable loop recorder.  Denies any racing heart rate or palpitations.  Continue current beta-blocker.  Most recent interrogation of the loop recorder without significant arrhythmia.   Medication changes: None Labs ordered: Lipi/LFTs Studies / Imaging ordered: Carotid Future considerations: Increase statin if elevated LDL Disposition: RTC 6 months   Arvil Chaco, PA-C 01/29/2020

## 2020-01-30 LAB — LIPID PANEL
Chol/HDL Ratio: 2.6 ratio (ref 0.0–5.0)
Cholesterol, Total: 129 mg/dL (ref 100–199)
HDL: 50 mg/dL (ref >39)
LDL Chol Calc (NIH): 56 mg/dL (ref 0–99)
Triglycerides: 135 mg/dL (ref 0–149)
VLDL Cholesterol Cal: 23 mg/dL (ref 5–40)

## 2020-01-30 LAB — LDL CHOLESTEROL, DIRECT: LDL Direct: 60 mg/dL (ref 0–99)

## 2020-02-23 ENCOUNTER — Telehealth: Payer: Self-pay | Admitting: Emergency Medicine

## 2020-02-23 NOTE — Telephone Encounter (Signed)
Alert received for 18 beat episode of NSVT on 02/22/20 at 1055 am. Patient reports he was asymptomatic and unaware of episode of NSVT. 2 previous episodes in the past recorded . Patient reports he may have missed some doses of his medications. Education done on consistent medication use.

## 2020-03-01 ENCOUNTER — Ambulatory Visit (INDEPENDENT_AMBULATORY_CARE_PROVIDER_SITE_OTHER): Payer: Medicare Other | Admitting: *Deleted

## 2020-03-01 DIAGNOSIS — R55 Syncope and collapse: Secondary | ICD-10-CM

## 2020-03-01 LAB — CUP PACEART REMOTE DEVICE CHECK
Date Time Interrogation Session: 20210512031705
Implantable Pulse Generator Implant Date: 20180716

## 2020-03-01 NOTE — Progress Notes (Signed)
Carelink Summary Report / Loop Recorder 

## 2020-03-08 ENCOUNTER — Ambulatory Visit: Payer: No Typology Code available for payment source | Admitting: Allergy

## 2020-03-08 ENCOUNTER — Other Ambulatory Visit: Payer: Self-pay

## 2020-03-08 ENCOUNTER — Encounter: Payer: Self-pay | Admitting: Allergy

## 2020-03-08 ENCOUNTER — Ambulatory Visit (INDEPENDENT_AMBULATORY_CARE_PROVIDER_SITE_OTHER): Payer: Medicare Other | Admitting: Allergy

## 2020-03-08 VITALS — BP 102/60 | HR 75 | Resp 18 | Ht 71.0 in

## 2020-03-08 DIAGNOSIS — J3089 Other allergic rhinitis: Secondary | ICD-10-CM

## 2020-03-08 DIAGNOSIS — R0602 Shortness of breath: Secondary | ICD-10-CM

## 2020-03-08 DIAGNOSIS — H1013 Acute atopic conjunctivitis, bilateral: Secondary | ICD-10-CM | POA: Diagnosis not present

## 2020-03-08 MED ORDER — AZELASTINE HCL 0.05 % OP SOLN: 1.0000 [drp] | Freq: Two times a day (BID) | OPHTHALMIC | 5 refills | Status: DC | PRN

## 2020-03-08 NOTE — Assessment & Plan Note (Signed)
Past history - Some shortness of breath at times. 2021 spirometry showed: possible restrictive disease with 8% improvement in FEV1 post bronchodilator treatment and feeling better. Interim history - Denies any symptoms and no albuterol use since last OV.  May use albuterol rescue inhaler 2 puffs every 4 to 6 hours as needed for shortness of breath, chest tightness, coughing, and wheezing. May use albuterol rescue inhaler 2 puffs 5 to 15 minutes prior to strenuous physical activities. Monitor frequency of use.

## 2020-03-08 NOTE — [place] Notes (Signed)
Follow Up Note  RE: Duane Warren MRN: [id number] DOB: [birth year] Date of Office Visit: 03/08/2020  Referring provider: Thomasene Lot, DO Primary care provider: Mayer Masker, PA-C  Chief Complaint: Allergic Rhinitis   History of Present Illness: I had the pleasure of seeing Duane Warren for a follow up visit at the [facility] on 03/08/2020. He is a 64 y.o. male, who is being followed for allergic rhinoconjunctivitis and shortness of breath. His previous allergy office visit was on 01/05/2020 with Dr. Selena Batten. Today is a regular follow up visit. Up to date with COVID-19 vaccine: yes, pfizer  Other allergic rhinitis Taking dymista 1 spray twice a day with good benefit. Having some itchy/red eyes. Not taking any antihistamines currently.   Shortness of breath Doing much better and has not needed to use albuterol. Not having any shortness of breath episodes.   Assessment and Plan: Duane Warren is a 64 y.o. male with: Other allergic rhinitis Past history - Rhino conjunctivitis symptoms mainly during the spring and summer. Current episode flared about 1 month ago. Using Flonase and pseudofed with some benefit. 2021 skin testing showed: Positive to perennial mold. Interim history - doing well with below regimen.   Continue environmental control measures - wear a mask when mowing the grass. Rinse sinuses out with saline spray after mowing.   Continue dymista (fluticasone + azelastine nasal spray combination) 1 spray per nostril twice a day.  Nasal saline spray (i.e., Simply Saline) or nasal saline lavage (i.e., NeilMed) is recommended as needed and prior to medicated nasal sprays.  May use over the counter antihistamines such as Zyrtec (cetirizine), Claritin (loratadine), Allegra (fexofenadine), or Xyzal (levocetirizine) daily as needed.  May use azelastine eye drops twice a day as needed for itchy/watery eyes.  Allergic conjunctivitis of both  eyes  See assessment and plan as above for allergic rhinitis.   Shortness of breath Past history - Some shortness of breath at times. 2021 spirometry showed: possible restrictive disease with 8% improvement in FEV1 post bronchodilator treatment and feeling better. Interim history - Denies any symptoms and no albuterol use since last OV.  May use albuterol rescue inhaler 2 puffs every 4 to 6 hours as needed for shortness of breath, chest tightness, coughing, and wheezing. May use albuterol rescue inhaler 2 puffs 5 to 15 minutes prior to strenuous physical activities. Monitor frequency of use.   Return in about 6 months (around 09/08/2020).  Meds ordered this encounter  Medications  . azelastine (OPTIVAR) 0.05 % ophthalmic solution    Sig: Place 1 drop into both eyes 2 (two) times daily as needed (itchy/watery eyes).    Dispense:  6 mL    Refill:  5   Diagnostics: None.  Medication List:  Current Outpatient Medications  Medication Sig Dispense Refill  . albuterol (VENTOLIN HFA) 108 (90 Base) MCG/ACT inhaler Inhale 2 puffs into the lungs every 4 (four) hours as needed for wheezing or shortness of breath. 18 g 1  . amLODipine (NORVASC) 10 MG tablet Take 10 mg by mouth daily.  0  . Azelastine-Fluticasone 137-50 MCG/ACT SUSP Place 1 spray into the nose in the morning and at bedtime. 23 g 5  . Cholecalciferol (VITAMIN D) 2000 units tablet Take 4,000 Units by mouth daily.     [name] losartan (COZAAR) 50 MG tablet Take 1 tablet (50 mg total) by mouth daily. 30 tablet 0  . OLOPATADINE HCL NA Place 2 sprays into the nose daily as needed (congestion).    [name]  rosuvastatin (CRESTOR) 20 MG tablet TAKE 1 TABLET (20 MG TOTAL) BY MOUTH AT BEDTIME. **PATIENT NEEDS APT FOR FURTHER REFILLS** 15 tablet 0  . spironolactone (ALDACTONE) 25 MG tablet Take 25 mg by mouth daily.  6  . azelastine (OPTIVAR) 0.05 % ophthalmic solution Place 1 drop into both eyes 2 (two) times daily as needed (itchy/watery eyes). 6 mL 5    No current facility-administered medications for this visit.   Allergies: No Known Allergies I reviewed his past medical history, social history, family history, and environmental history and no significant changes have been reported from his previous visit.  Review of Systems  Constitutional: Negative for appetite change, chills, fever and unexpected weight change.  HENT: Negative for congestion, postnasal drip and rhinorrhea.   Eyes: Positive for itching.  Respiratory: Negative for cough, chest tightness, shortness of breath and wheezing.   Cardiovascular: Negative for chest pain.  Gastrointestinal: Negative for abdominal pain.  Genitourinary: Negative for difficulty urinating.  Skin: Negative for rash.  Allergic/Immunologic: Positive for environmental allergies. Negative for food allergies.  Neurological: Negative for headaches.   Objective: BP 102/60   Pulse 75   Resp 18   Ht 5\' 11"  (1.803 m)   SpO2 96%   BMI 28.73 kg/m  Body mass index is 28.73 kg/m. Physical Exam  Constitutional: He is oriented to person, place, and time. He appears well-developed and well-nourished.  HENT:  Head: Normocephalic and atraumatic.  Right Ear: External ear normal.  Left Ear: External ear normal.  Mouth/Throat: Oropharynx is clear and moist.  Eyes: Conjunctivae and EOM are normal.  Cardiovascular: Normal rate, regular rhythm and normal heart sounds. Exam reveals no gallop and no friction rub.  No murmur heard. Pulmonary/Chest: Effort normal and breath sounds normal. He has no wheezes. He has no rales.  Abdominal: Soft.  Musculoskeletal:     Cervical back: Neck supple.  Neurological: He is alert and oriented to person, place, and time.  Skin: Skin is warm. No rash noted.  Psychiatric: He has a normal mood and affect. His behavior is normal.  Nursing note and vitals reviewed.  Previous notes and tests were reviewed. The plan was reviewed with the patient/family, and all  questions/concerned were addressed.  It was my pleasure to see Duane Warren today and participate in his care. Please feel free to contact me with any questions or concerns.  Sincerely,  Wyline Mood, DO Allergy & Immunology  [facility] office: [phone] [facility] office: [phone] [place] office: [phone]

## 2020-03-08 NOTE — Assessment & Plan Note (Signed)
   See assessment and plan as above for allergic rhinitis.  

## 2020-03-08 NOTE — Patient Instructions (Addendum)
Past skin testing showed: Positive to perennial mold.   Continue environmental control measures - wear a mask when mowing the grass. Rinse your sinuses out with saline spray after mowing.   Continue dymista (fluticasone + azelastine nasal spray combination) 1 spray per nostril twice a day.  Nasal saline spray (i.e., Simply Saline) or nasal saline lavage (i.e., NeilMed) is recommended as needed and prior to medicated nasal sprays.  May use over the counter antihistamines such as Zyrtec (cetirizine), Claritin (loratadine), Allegra (fexofenadine), or Xyzal (levocetirizine) daily as needed.  May use azelastine eye drops twice a day as needed for itchy/watery eyes.  Breathing:   May use albuterol rescue inhaler 2 puffs or nebulizer every 4 to 6 hours as needed for shortness of breath, chest tightness, coughing, and wheezing. May use albuterol rescue inhaler 2 puffs 5 to 15 minutes prior to strenuous physical activities. Monitor frequency of use.   Follow up in 6 months or sooner if needed.   Mold Control . Mold and fungi can grow on a variety of surfaces provided certain temperature and moisture conditions exist.  . Outdoor molds grow on plants, decaying vegetation and soil. The major outdoor mold, Alternaria and Cladosporium, are found in very high numbers during hot and dry conditions. Generally, a late summer - fall peak is seen for common outdoor fungal spores. Rain will temporarily lower outdoor mold spore count, but counts rise rapidly when the rainy period ends. . The most important indoor molds are Aspergillus and Penicillium. Dark, humid and poorly ventilated basements are ideal sites for mold growth. The next most common sites of mold growth are the bathroom and the kitchen. Outdoor (Seasonal) Mold Control . Use air conditioning and keep windows closed. . Avoid exposure to decaying vegetation. [name] Avoid leaf raking. . Avoid grain handling. . Consider wearing a face mask if working in  moldy areas.  Indoor (Perennial) Mold Control  . Maintain humidity below 50%. . Get rid of mold growth on hard surfaces with water, detergent and, if necessary, 5% bleach (do not mix with other cleaners). Then dry the area completely. If mold covers an area more than 10 square feet, consider hiring an indoor environmental professional. . For clothing, washing with soap and water is best. If moldy items cannot be cleaned and dried, throw them away. . Remove sources e.g. contaminated carpets. . Repair and seal leaking roofs or pipes. Using dehumidifiers in damp basements may be helpful, but empty the water and clean units regularly to prevent mildew from forming. All rooms, especially basements, bathrooms and kitchens, require ventilation and cleaning to deter mold and mildew growth. Avoid carpeting on concrete or damp floors, and storing items in damp areas.

## 2020-03-08 NOTE — Assessment & Plan Note (Signed)
Past history - Rhino conjunctivitis symptoms mainly during the spring and summer. Current episode flared about 1 month ago. Using Flonase and pseudofed with some benefit. 2021 skin testing showed: Positive to perennial mold. Interim history - doing well with below regimen.   Continue environmental control measures - wear a mask when mowing the grass. Rinse sinuses out with saline spray after mowing.   Continue dymista (fluticasone + azelastine nasal spray combination) 1 spray per nostril twice a day.  Nasal saline spray (i.e., Simply Saline) or nasal saline lavage (i.e., NeilMed) is recommended as needed and prior to medicated nasal sprays.  May use over the counter antihistamines such as Zyrtec (cetirizine), Claritin (loratadine), Allegra (fexofenadine), or Xyzal (levocetirizine) daily as needed.  May use azelastine eye drops twice a day as needed for itchy/watery eyes.

## 2020-03-09 ENCOUNTER — Telehealth: Payer: Self-pay | Admitting: Physician Assistant

## 2020-03-09 DIAGNOSIS — R351 Nocturia: Secondary | ICD-10-CM

## 2020-03-09 NOTE — Telephone Encounter (Signed)
Per patient he has been going to the bathroom too much at night & wishes to see provider or a (local) Urologist for evaluation & or treatment.  --- Per patient hasn't seen one in a while & doesn't remember who he saw in [place], [place] in the past.  --Forwarding request to med asst for review w/ provider regarding request since no available appt till mid June.  Please contact patient if there are any question or [phone] or [phone]  --glh

## 2020-03-09 NOTE — Telephone Encounter (Signed)
[facility] to place Urology referral.

## 2020-03-09 NOTE — Telephone Encounter (Signed)
Is ok to place referral or would you like to see him in the office first?  Charyl Bigger, CMA

## 2020-03-14 ENCOUNTER — Other Ambulatory Visit: Payer: Self-pay | Admitting: Allergy

## 2020-03-31 ENCOUNTER — Ambulatory Visit: Payer: Medicare Other

## 2020-04-01 ENCOUNTER — Telehealth: Payer: Self-pay

## 2020-04-01 NOTE — Telephone Encounter (Signed)
Made in ERROR

## 2020-04-05 ENCOUNTER — Ambulatory Visit (INDEPENDENT_AMBULATORY_CARE_PROVIDER_SITE_OTHER): Payer: Medicare Other | Admitting: *Deleted

## 2020-04-05 DIAGNOSIS — R55 Syncope and collapse: Secondary | ICD-10-CM | POA: Diagnosis not present

## 2020-04-05 LAB — CUP PACEART REMOTE DEVICE CHECK
Date Time Interrogation Session: 20210621005453
Implantable Pulse Generator Implant Date: 20180716

## 2020-04-05 NOTE — Progress Notes (Signed)
Carelink Summary Report / Loop Recorder 

## 2020-04-20 ENCOUNTER — Encounter: Payer: Self-pay | Admitting: Internal Medicine

## 2020-04-20 ENCOUNTER — Ambulatory Visit (INDEPENDENT_AMBULATORY_CARE_PROVIDER_SITE_OTHER): Payer: Medicare Other | Admitting: Internal Medicine

## 2020-04-20 ENCOUNTER — Telehealth: Payer: Self-pay | Admitting: Internal Medicine

## 2020-04-20 ENCOUNTER — Other Ambulatory Visit: Payer: Self-pay

## 2020-04-20 VITALS — BP 130/90 | HR 70 | Ht 71.0 in | Wt 203.2 lb

## 2020-04-20 DIAGNOSIS — R0609 Other forms of dyspnea: Secondary | ICD-10-CM

## 2020-04-20 DIAGNOSIS — I6523 Occlusion and stenosis of bilateral carotid arteries: Secondary | ICD-10-CM

## 2020-04-20 DIAGNOSIS — I472 Ventricular tachycardia: Secondary | ICD-10-CM

## 2020-04-20 DIAGNOSIS — I2510 Atherosclerotic heart disease of native coronary artery without angina pectoris: Secondary | ICD-10-CM

## 2020-04-20 DIAGNOSIS — R072 Precordial pain: Secondary | ICD-10-CM

## 2020-04-20 DIAGNOSIS — I4729 Other ventricular tachycardia: Secondary | ICD-10-CM

## 2020-04-20 DIAGNOSIS — R0600 Dyspnea, unspecified: Secondary | ICD-10-CM

## 2020-04-20 DIAGNOSIS — H532 Diplopia: Secondary | ICD-10-CM

## 2020-04-20 NOTE — Patient Instructions (Signed)
Medication Instructions:  - Your physician recommends that you continue on your current medications as directed. Please refer to the Current Medication list given to you today.  *If you need a refill on your cardiac medications before your next appointment, please call your pharmacy*   Lab Work: - Your physician recommends that you have lab work today: BMP/ BNP  If you have labs (blood work) drawn today and your tests are completely normal, you will receive your results only by: [name] MyChart Message (if you have MyChart) OR . A paper copy in the mail If you have any lab test that is abnormal or we need to change your treatment, we will call you to review the results.   Testing/Procedures: - Your physician has recommended that you have a MRI of the brain without contrast. - You may call the radiology department directly at [phone] to schedule this   - Your physician has requested that you have a lexiscan myoview.   [place]  Your caregiver has ordered a Stress Test with nuclear imaging. The purpose of this test is to evaluate the blood supply to your heart muscle. This procedure is referred to as a "Non-Invasive Stress Test." This is because other than having an IV started in your vein, nothing is inserted or "invades" your body. Cardiac stress tests are done to find areas of poor blood flow to the heart by determining the extent of coronary artery disease (CAD). Some patients exercise on a treadmill, which naturally increases the blood flow to your heart, while others who are  unable to walk on a treadmill due to physical limitations have a pharmacologic/chemical stress agent called Lexiscan . This medicine will mimic walking on a treadmill by temporarily increasing your coronary blood flow.   Please note: these test may take anywhere between 2-4 hours to complete  PLEASE REPORT TO [facility] AT THE FIRST DESK WILL DIRECT YOU WHERE TO GO  Date of  Procedure:_____________________________________  Arrival Time for Procedure:______________________________  Instructions regarding medication:   _x___ : You may take all of your regular medications the morning of your test with enough water to get them down safely.   PLEASE NOTIFY THE OFFICE AT LEAST 109 HOURS IN ADVANCE IF YOU ARE UNABLE TO KEEP YOUR APPOINTMENT.  [phone] AND  PLEASE NOTIFY NUCLEAR MEDICINE AT [facility] AT LEAST 24 HOURS IN ADVANCE IF YOU ARE UNABLE TO KEEP YOUR APPOINTMENT. [phone]  How to prepare for your Myoview test:  1. Do not eat or drink after midnight 2. No caffeine for 24 hours prior to test 3. No smoking 24 hours prior to test. 4. Your medication may be taken with water.  If your doctor stopped a medication because of this test, do not take that medication. 5. Ladies, please do not wear dresses.  Skirts or pants are appropriate. Please wear a short sleeve shirt. 6. No perfume, cologne or lotion. 7. Wear comfortable walking shoes. No heels!        Follow-Up: At [facility], you and your health needs are our priority.  As part of our continuing mission to provide you with exceptional heart care, we have created designated Provider Care Teams.  These Care Teams include your primary Cardiologist (physician) and Advanced Practice Providers (APPs -  Physician Assistants and Nurse Practitioners) who all work together to provide you with the care you need, when you need it.  We recommend signing up for the patient portal called "MyChart".  Sign up information is provided on this After Visit Summary.  MyChart is used to connect with patients for Virtual Visits (Telemedicine).  Patients are able to view lab/test results, encounter notes, upcoming appointments, etc.  Non-urgent messages can be sent to your provider as well.   To learn more about what you can do with MyChart, go to [email or web address].    Your next appointment:   1 year(s)  The format for  your next appointment:   In Person  Provider:   Virl Axe, MD   Other Instructions N/a   Cardiac Nuclear Scan A cardiac nuclear scan is a test that measures blood flow to the heart when a person is resting and when he or she is exercising. The test looks for problems such as:  Not enough blood reaching a portion of the heart.  The heart muscle not working normally. You may need this test if:  You have heart disease.  You have had abnormal lab results.  You have had heart surgery or a balloon procedure to open up blocked arteries (angioplasty).  You have chest pain.  You have shortness of breath. In this test, a radioactive dye (tracer) is injected into your bloodstream. After the tracer has traveled to your heart, an imaging device is used to measure how much of the tracer is absorbed by or distributed to various areas of your heart. This procedure is usually done at a hospital and takes 2-4 hours. Tell a health care provider about:  Any allergies you have.  All medicines you are taking, including vitamins, herbs, eye drops, creams, and over-the-counter medicines.  Any problems you or family members have had with anesthetic medicines.  Any blood disorders you have.  Any surgeries you have had.  Any medical conditions you have.  Whether you are pregnant or may be pregnant. What are the risks? Generally, this is a safe procedure. However, problems may occur, including:  Serious chest pain and heart attack. This is only a risk if the stress portion of the test is done.  Rapid heartbeat.  Sensation of warmth in your chest. This usually passes quickly.  Allergic reaction to the tracer. What happens before the procedure?  Ask your health care provider about changing or stopping your regular medicines. This is especially important if you are taking diabetes medicines or blood thinners.  Follow instructions from your health care provider about eating or drinking  restrictions.  Remove your jewelry on the day of the procedure. What happens during the procedure?  An IV will be inserted into one of your veins.  Your health care provider will inject a small amount of radioactive tracer through the IV.  You will wait for 20-40 minutes while the tracer travels through your bloodstream.  Your heart activity will be monitored with an electrocardiogram (ECG).  You will lie down on an exam table.  Images of your heart will be taken for about 15-20 minutes.  You may also have a stress test. For this test, one of the following may be done: ? You will exercise on a treadmill or stationary bike. While you exercise, your heart's activity will be monitored with an ECG, and your blood pressure will be checked. ? You will be given medicines that will increase blood flow to parts of your heart. This is done if you are unable to exercise.  When blood flow to your heart has peaked, a tracer will again be injected through the IV.  After 20-40 minutes, you will  get back on the exam table and have more images taken of your heart.  Depending on the type of tracer used, scans may need to be repeated 3-4 hours later.  Your IV line will be removed when the procedure is over. The procedure may vary among health care providers and hospitals. What happens after the procedure?  Unless your health care provider tells you otherwise, you may return to your normal schedule, including diet, activities, and medicines.  Unless your health care provider tells you otherwise, you may increase your fluid intake. This will help to flush the contrast dye from your body. Drink enough fluid to keep your urine pale yellow.  Ask your health care provider, or the department that is doing the test: ? When will my results be ready? ? How will I get my results? Summary  A cardiac nuclear scan measures the blood flow to the heart when a person is resting and when he or she is  exercising.  Tell your health care provider if you are pregnant.  Before the procedure, ask your health care provider about changing or stopping your regular medicines. This is especially important if you are taking diabetes medicines or blood thinners.  After the procedure, unless your health care provider tells you otherwise, increase your fluid intake. This will help flush the contrast dye from your body.  After the procedure, unless your health care provider tells you otherwise, you may return to your normal schedule, including diet, activities, and medicines. This information is not intended to replace advice given to you by your health care provider. Make sure you discuss any questions you have with your health care provider. Document Revised: 03/18/2018 Document Reviewed: 03/18/2018 Elsevier Patient Education  [place].

## 2020-04-20 NOTE — Telephone Encounter (Signed)
Patient saw Dr Caryl Comes today  Patient forgot to ask about diet and exercise information  Please call to discuss

## 2020-04-20 NOTE — Progress Notes (Signed)
Patient Care Team: Lorrene Reid, PA-C as PCP - General Deboraha Sprang, MD as PCP - Cardiology (Cardiology) [name], MD as Consulting Physician (Cardiology) [name], MD as Referring Physician (Endocrinology) [name], Armida Sans as Physician Assistant (Family Medicine)   HPI  Duane Warren is a 64 y.o. 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 He underwent loop recorder implantation for assessment of the latter.  No interval palpitations or syncope.  Nonsustained ventricular tachycardia, one recorded, was unassociated with lightheadedness or palpitations.  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 12/2015 Cardiac CT: Ca2+ score of 726 (91st %'ile).   Anxiety    Apical variant hypertrophic cardiomyopathy ([place])    a. 01/2016 Echo: EF 55-60%, no rwma, mild AI, Ao root 81mm, mild MR, mod dil LA, mod TR, nl PASP; b. 12/2016 Cardiac MRI: EF 64%, sev apical hypertrophy up to 89mm in diastole. LGE in apical inf, lateral, and apical walls; c. 01/2018 Echo: EF 50-55%, gr2 DD. Mod-Sev apical hypertrophy. Mild AI. Mildly dil Ao root (3.8cm) and asc Ao (3.9cm). Sev dil LA. Nl RV fxn.   Dilated aortic root ([place])    a. 01/2016 Ao root 31mm; b. 12/2016 Cardiac MRI: Mild aneurysmal dil of Asc Ao - 96mm; c. 01/2018 Echo: dil Ao root (3.8cm) and asc Ao (3.9cm).   Hyperaldosteronism ([place])    Hypertension    Syncope    a. s/p MDT Linq.    Past Surgical History:    Procedure Laterality Date   HERNIA REPAIR     KNEE SURGERY     unknown which side   LOOP RECORDER INSERTION N/A 04/30/2017   Procedure: Loop Recorder Insertion;  Surgeon: Deboraha Sprang, MD;  Location: [facility];  Service: Cardiovascular;  Laterality: N/A;   SKIN GRAFT      Current Outpatient Medications  Medication Sig Dispense Refill   albuterol (VENTOLIN HFA) 108 (90 Base) MCG/ACT inhaler INHALE 2 PUFFS INTO THE LUNGS EVERY 4 (FOUR) HOURS AS NEEDED FOR WHEEZING OR SHORTNESS OF BREATH. 18 g 1   amLODipine (NORVASC) 10 MG tablet Take 10 mg by mouth daily.  0   azelastine (OPTIVAR) 0.05 % ophthalmic solution Place 1 drop into both eyes 2 (two) times daily as needed (itchy/watery eyes). 6 mL 5   Azelastine-Fluticasone 137-50 MCG/ACT SUSP Place 1 spray into the nose in the morning and at bedtime. 23 g 5   Cholecalciferol (VITAMIN D) 2000 units tablet Take 4,000 Units by mouth daily.      losartan (COZAAR) 50 MG tablet Take 1 tablet (50 mg total) by mouth daily. 30 tablet 0   rosuvastatin (CRESTOR) 20 MG tablet TAKE 1 TABLET (20 MG TOTAL) BY MOUTH AT BEDTIME. **PATIENT NEEDS APT FOR FURTHER REFILLS** 15 tablet 0   spironolactone (ALDACTONE) 25 MG tablet Take 25  mg by mouth daily.  6   tamsulosin (FLOMAX) 0.4 MG CAPS capsule Take 0.4 mg by mouth daily.     No current facility-administered medications for this visit.    No Known Allergies    Review of Systems negative except from HPI and PMH  Physical Exam BP 130/90 (BP Location: Left Arm, Patient Position: Sitting, Cuff Size: Normal)    Pulse 70    Ht 5\' 11"  (1.803 m)    Wt 203 lb 4 oz (92.2 kg)    SpO2 98%    BMI 28.35 kg/m  Well developed and well nourished in no acute distress HENT normal Neck supple with JVP-flat Clear Device pocket well healed; without hematoma or erythema.  There is no tethering  Regular rate and rhythm, no   gallop No murmur Abd-soft with active BS No Clubbing cyanosis   edema Skin-warm and dry A & Oriented  Grossly normal sensory and motor function  ECG sinus 65 18/08/42 TWI V2-V6    Assessment and  Plan  Hypertrophic cardiomyopathy- apical  Palpitations  Dyspnea on exertion  Bendopnea  Hypertension  Syncope  Implantable loop recorder  High Risk Medication Surveillance  VT Nonsustained   Recurrent VT nonsustained, without apparent symptoms  Double vision is concerning, does not have PCP  Possible  TIA, notwithstanding no AFib on loop>> head MRI   Worsening exercise intolerance and benopnea concerning for both volume overload and with elevated CA Score could be ischemia >> CTA and check BNP  BP well controlled  LDL at goal.

## 2020-04-20 NOTE — Telephone Encounter (Signed)
Dr. Caryl Comes made aware that the patient had left this message prior to leaving clinic today. He took the patient's number to try to call him.

## 2020-04-21 LAB — BASIC METABOLIC PANEL
BUN/Creatinine Ratio: 13 (ref 10–24)
BUN: 17 mg/dL (ref 8–27)
CO2: 21 mmol/L (ref 20–29)
Calcium: 10.1 mg/dL (ref 8.6–10.2)
Chloride: 106 mmol/L (ref 96–106)
Creatinine, Ser: 1.34 mg/dL — ABNORMAL HIGH (ref 0.76–1.27)
GFR calc Af Amer: 64 mL/min/{1.73_m2} (ref >59)
GFR calc non Af Amer: 55 mL/min/{1.73_m2} — ABNORMAL LOW (ref >59)
Glucose: 102 mg/dL — ABNORMAL HIGH (ref 65–99)
Potassium: 4.2 mmol/L (ref 3.5–5.2)
Sodium: 145 mmol/L — ABNORMAL HIGH (ref 134–144)

## 2020-04-21 LAB — BRAIN NATRIURETIC PEPTIDE: BNP: 92.2 pg/mL (ref 0.0–100.0)

## 2020-04-22 NOTE — Telephone Encounter (Signed)
Per Dr. Caryl Comes, he called and spoke with the patient yesterday and answered his questions. No additional recommendations at this time per Dr. Caryl Comes.

## 2020-04-27 ENCOUNTER — Encounter
Admission: RE | Admit: 2020-04-27 | Discharge: 2020-04-27 | Disposition: A | Discharge status: Home / Self Care | Payer: Medicare Other | Source: Ambulatory Visit | Attending: Internal Medicine | Admitting: Internal Medicine

## 2020-04-27 ENCOUNTER — Other Ambulatory Visit: Payer: Self-pay

## 2020-04-27 DIAGNOSIS — R072 Precordial pain: Secondary | ICD-10-CM | POA: Diagnosis not present

## 2020-04-27 LAB — NM MYOCAR MULTI W/SPECT W/WALL MOTION / EF
LV dias vol: 112 mL (ref 62–150)
LV sys vol: 48 mL
Peak HR: 97 {beats}/min
Percent HR: 62 %
Rest HR: 66 {beats}/min
TID: 1.01

## 2020-04-27 MED ORDER — REGADENOSON 0.4 MG/5ML IV SOLN
0.4000 mg | Freq: Once | INTRAVENOUS | Status: AC
  Administered 2020-04-27: 0.4 mg via INTRAVENOUS

## 2020-04-27 MED ORDER — TECHNETIUM TC 99M TETROFOSMIN IV KIT
31.6100 | PACK | Freq: Once | INTRAVENOUS | Status: AC | PRN
  Administered 2020-04-27: 31.61 via INTRAVENOUS

## 2020-04-27 MED ORDER — TECHNETIUM TC 99M TETROFOSMIN IV KIT
10.0000 | PACK | Freq: Once | INTRAVENOUS | Status: AC | PRN
  Administered 2020-04-27: 10.13 via INTRAVENOUS

## 2020-04-28 ENCOUNTER — Telehealth: Payer: Self-pay | Admitting: Internal Medicine

## 2020-04-28 DIAGNOSIS — I422 Other hypertrophic cardiomyopathy: Secondary | ICD-10-CM

## 2020-04-28 NOTE — Telephone Encounter (Signed)
Patient calling  Would like to check on status of results and to discuss some follow up questions Please call to discuss

## 2020-04-28 NOTE — Telephone Encounter (Signed)
Spoke with patient. He verbalized understanding of the results as stated by Dr Caryl Comes in the result note for the [facility]. He is aware an echo is needed to further determine his EF and the need for ICD.   He was very Patent attorney. Echo scheduled and patient verbalized understanding of the appointment date and time.

## 2020-05-06 ENCOUNTER — Other Ambulatory Visit (HOSPITAL_BASED_OUTPATIENT_CLINIC_OR_DEPARTMENT_OTHER): Payer: Self-pay | Admitting: Internal Medicine

## 2020-05-06 ENCOUNTER — Telehealth: Payer: Self-pay | Admitting: *Deleted

## 2020-05-06 NOTE — Telephone Encounter (Signed)
-----   Message from Deboraha Sprang, MD sent at 05/04/2020  9:43 PM EDT ----- Please Inform Patient  Labs are normal x mild renal dysfunction   Thanks

## 2020-05-06 NOTE — Telephone Encounter (Signed)
Left voicemail message to call back for results.  

## 2020-05-08 ENCOUNTER — Ambulatory Visit (HOSPITAL_BASED_OUTPATIENT_CLINIC_OR_DEPARTMENT_OTHER)
Admission: RE | Admit: 2020-05-08 | Discharge: 2020-05-08 | Disposition: A | Discharge status: Home / Self Care | Payer: Medicare Other | Source: Ambulatory Visit | Attending: Internal Medicine | Admitting: Internal Medicine

## 2020-05-08 ENCOUNTER — Other Ambulatory Visit: Payer: Self-pay

## 2020-05-08 DIAGNOSIS — H532 Diplopia: Secondary | ICD-10-CM

## 2020-05-10 ENCOUNTER — Ambulatory Visit: Payer: Medicare Other

## 2020-05-10 ENCOUNTER — Ambulatory Visit (INDEPENDENT_AMBULATORY_CARE_PROVIDER_SITE_OTHER): Payer: Medicare Other | Admitting: *Deleted

## 2020-05-10 ENCOUNTER — Other Ambulatory Visit: Payer: Self-pay

## 2020-05-10 DIAGNOSIS — I472 Ventricular tachycardia: Secondary | ICD-10-CM

## 2020-05-10 DIAGNOSIS — R42 Dizziness and giddiness: Secondary | ICD-10-CM

## 2020-05-10 DIAGNOSIS — I4729 Other ventricular tachycardia: Secondary | ICD-10-CM

## 2020-05-10 NOTE — Telephone Encounter (Signed)
The patient reviewed his results on MyChart.

## 2020-05-11 LAB — CUP PACEART REMOTE DEVICE CHECK
Date Time Interrogation Session: 20210726000034
Implantable Pulse Generator Implant Date: 20180716

## 2020-05-11 NOTE — Progress Notes (Signed)
Carelink Summary Report / Loop Recorder 

## 2020-05-13 ENCOUNTER — Other Ambulatory Visit: Payer: Self-pay

## 2020-05-13 ENCOUNTER — Ambulatory Visit (INDEPENDENT_AMBULATORY_CARE_PROVIDER_SITE_OTHER): Payer: Medicare Other

## 2020-05-13 DIAGNOSIS — I422 Other hypertrophic cardiomyopathy: Secondary | ICD-10-CM

## 2020-05-13 MED ORDER — PERFLUTREN LIPID MICROSPHERE
1.0000 mL | INTRAVENOUS | Status: AC | PRN
  Administered 2020-05-13: 3 mL via INTRAVENOUS

## 2020-05-14 ENCOUNTER — Telehealth: Payer: Self-pay | Admitting: Internal Medicine

## 2020-05-14 LAB — ECHOCARDIOGRAM COMPLETE
AR max vel: 4.56 cm2
AV Area VTI: 3.71 cm2
AV Area mean vel: 3.85 cm2
AV Mean grad: 3 mmHg
AV Peak grad: 5.8 mmHg
Ao pk vel: 1.2 m/s
S' Lateral: 3.9 cm

## 2020-05-14 NOTE — Telephone Encounter (Signed)
I spoke with the patient regarding his brain MRI. He voices understanding of the results. He states he has not been made aware of any diagnosis of atrial fibrillation in the past.  Will forward to Dr. Caryl Comes as an Juluis Rainier.

## 2020-05-14 NOTE — Telephone Encounter (Signed)
Attempted to call the patient with Brain MRI results. No answer- I left a message to please call back.

## 2020-05-14 NOTE — Telephone Encounter (Signed)
Per Dr. Caryl Comes: Please Inform Patient that study was normal x that is showed an old stroke probably unrelated   Does he have any history of afib    Thanks

## 2020-05-17 ENCOUNTER — Telehealth: Payer: Medicare Other | Admitting: Internal Medicine

## 2020-05-19 ENCOUNTER — Telehealth: Payer: Self-pay

## 2020-05-19 NOTE — Telephone Encounter (Signed)
Call to patient to review carotid u/s results.    Pt verbalized understanding and has no further questions at this time.    Advised pt to call for any further questions or concerns.  No further orders.   

## 2020-05-19 NOTE — Telephone Encounter (Signed)
-----   Message from Arvil Chaco, PA-C sent at 05/19/2020  4:36 PM EDT ----- Kermit Balo news! Study showed non-hemodynamically significant mild plaque of the carotid (<50%) and of the extracranial vessels. Vertebral and subclavian arteries with normal flow. Overall, reassuring study and rules out as the cause of his dizziness.

## 2020-06-13 LAB — CUP PACEART REMOTE DEVICE CHECK
Date Time Interrogation Session: 20210827231847
Implantable Pulse Generator Implant Date: 20180716

## 2020-06-14 ENCOUNTER — Ambulatory Visit (INDEPENDENT_AMBULATORY_CARE_PROVIDER_SITE_OTHER): Payer: Medicare Other | Admitting: *Deleted

## 2020-06-14 DIAGNOSIS — R55 Syncope and collapse: Secondary | ICD-10-CM

## 2020-06-15 NOTE — Progress Notes (Signed)
Carelink Summary Report / Loop Recorder 

## 2020-07-07 ENCOUNTER — Telehealth: Payer: Self-pay | Admitting: Cardiovascular Disease

## 2020-07-07 NOTE — Telephone Encounter (Signed)
Can you please verify if you would like me to refill this under Dr. Caryl Comes.  It looks like PCP ordered it and it looks like patient has not followed up with them.

## 2020-07-07 NOTE — Telephone Encounter (Signed)
The patient has current lipids on file from [place], [place]. Ok to refill Crestor under Dr. Caryl Comes.  Thanks!

## 2020-07-07 NOTE — Telephone Encounter (Signed)
*  STAT* If patient is at the pharmacy, call can be transferred to refill team.   1. Which medications need to be refilled? (please list name of each medication and dose if known) rosuvastatin (CRESTOR) 20 MG  2. Which pharmacy/location (including street and city if local pharmacy) is medication to be sent to? [facility] [address]   3. Do they need a 30 day or 90 day supply? 90 day

## 2020-07-08 ENCOUNTER — Other Ambulatory Visit: Payer: Self-pay

## 2020-07-08 DIAGNOSIS — E785 Hyperlipidemia, unspecified: Secondary | ICD-10-CM

## 2020-07-08 DIAGNOSIS — I2510 Atherosclerotic heart disease of native coronary artery without angina pectoris: Secondary | ICD-10-CM

## 2020-07-08 DIAGNOSIS — I6523 Occlusion and stenosis of bilateral carotid arteries: Secondary | ICD-10-CM

## 2020-07-08 MED ORDER — ROSUVASTATIN CALCIUM 20 MG PO TABS: 20.0000 mg | ORAL_TABLET | Freq: Every day | ORAL | 0 refills | Status: DC

## 2020-07-08 NOTE — Telephone Encounter (Signed)
rosuvastatin (CRESTOR) 20 MG tablet 90 tablet 0 07/08/2020    Sig - Route: Take 1 tablet (20 mg total) by mouth at bedtime. - Oral   Sent to pharmacy as: rosuvastatin (CRESTOR) 20 MG tablet   E-Prescribing Status: Receipt confirmed by pharmacy (07/08/2020  2:31 PM EDT)

## 2020-07-15 LAB — CUP PACEART REMOTE DEVICE CHECK
Date Time Interrogation Session: 20210929233233
Implantable Pulse Generator Implant Date: 20180716

## 2020-07-19 ENCOUNTER — Ambulatory Visit (INDEPENDENT_AMBULATORY_CARE_PROVIDER_SITE_OTHER): Payer: Medicare Other

## 2020-07-19 DIAGNOSIS — R55 Syncope and collapse: Secondary | ICD-10-CM | POA: Diagnosis not present

## 2020-07-20 NOTE — Progress Notes (Signed)
Carelink Summary Report / Loop Recorder 

## 2020-08-05 NOTE — Progress Notes (Signed)
Cardiology Office Note  Date:  08/06/2020   ID:  Duane Warren, Duane Warren Mar 05, 1956, MRN [id number]  PCP:  Irena Reichmann, DO   Chief Complaint  Patient presents with  . Follow-up    6 Months follow up. Medications verbally reviewed with patient.     HPI:  Duane Warren is a pleasant 64 year old gentleman with CAD,  history of gout, hernia repair, knee arthroscopy.  history of primary aldosteronism ,on Aldactone. Apical hypertrophy Last visit March 2017 had CT coronary calcium score Score of 726, Mildly dilated ascending aorta 41 mm,  He presents today for follow-up of his Coronary artery disease, apical hypertrophy on echocardiogram  Last seen in clinic by myself March 2018 Recently seen by Dr. 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 He did not feel well and was weaned off the medication. It was recommended that he start an alternate medication but he did not start this. He reports that since then, his dizziness has resolved.    he had extensive lab work in March 2014. Total cholesterol 189, HDL 56, LDL 104, lipoprotein (a) 61, CRP 3.4, hemoglobin A1c 5.5, normal LFTs, testosterone 387, Apo B 72 TSH 2.1 Reports  that there is mild family history of cancer, hypertension him a CAD Blood pressure at home is typically in the 120-130 range over 60-70   PMH:   has a past medical history of Agatston coronary artery calcium score greater than 400, Anxiety, Apical variant hypertrophic cardiomyopathy (HCC), Dilated aortic root (HCC), Hyperaldosteronism (HCC), Hypertension, and Syncope.  PSH:    Past Surgical History:  Procedure Laterality Date  . HERNIA REPAIR    . KNEE SURGERY     unknown which side  . LOOP RECORDER INSERTION N/A 04/30/2017   Procedure: Loop Recorder Insertion;  Surgeon: [name], MD;  Location: [facility];  Service: Cardiovascular;  Laterality: N/A;  . SKIN GRAFT      Current Outpatient Medications  Medication Sig Dispense Refill  . albuterol (VENTOLIN HFA) 108 (90 Base) MCG/ACT inhaler INHALE 2 PUFFS INTO THE LUNGS EVERY 4 (FOUR) HOURS AS NEEDED FOR WHEEZING OR SHORTNESS OF BREATH. 18 g 1  . amLODipine (NORVASC) 10 MG tablet Take 1 tablet (10 mg total) by mouth daily. 90 tablet 3  . Azelastine-Fluticasone 137-50 MCG/ACT SUSP Place 1 spray into the nose in the morning and at bedtime. 23 g 5  . Cholecalciferol (VITAMIN D) 2000 units tablet Take 4,000 Units by mouth daily.     [name] losartan (COZAAR) 50 MG tablet Take 1 tablet (50 mg total) by mouth daily. 90 tablet 3  . oxybutynin (DITROPAN-XL) 10 MG 24 hr tablet Take 10 mg by mouth daily.    . rosuvastatin (CRESTOR) 20 MG tablet Take 1 tablet (20 mg  total) by mouth at bedtime. 90 tablet 3  . spironolactone (ALDACTONE) 25 MG tablet Take 1 tablet (25 mg total) by mouth daily. 90 tablet 3   No current facility-administered medications for this visit.     Allergies:   Patient has no known allergies.   Social History:  The patient  reports that he has never smoked. He has never used smokeless tobacco. He reports current alcohol use of about 1.0 standard drink of alcohol per week. He reports that he does not use drugs.    Family History:   family history includes Heart attack in his father; Hypertension in his father and mother; Spina bifida in his sister.    Review of Systems: Review of Systems  Constitutional: Negative.   Respiratory: Negative.   Cardiovascular: Negative.   Gastrointestinal: Negative.   Musculoskeletal: Negative.   Neurological: Negative.   Psychiatric/Behavioral: Negative.   All other systems reviewed and are negative.    PHYSICAL EXAM: VS:  BP 124/78 (BP Location: Left Arm, Patient Position: Sitting, Cuff Size: Normal)   Pulse 72   Ht 5\' 11"  (1.803 m)   Wt 201 lb (91.2 kg)   SpO2 98%   BMI 28.03 kg/m  , BMI Body mass index is 28.03 kg/m. Constitutional:  oriented to person, place, and time. No distress.  HENT:  Head: Grossly normal Eyes:  no discharge. No scleral icterus.  Neck: No JVD, no carotid bruits  Cardiovascular: Regular rate and rhythm, no murmurs appreciated Pulmonary/Chest: Clear to auscultation bilaterally, no wheezes or rails Abdominal: Soft.  no distension.  no tenderness.  Musculoskeletal: Normal range of motion Neurological:  normal muscle tone. Coordination normal. No atrophy Skin: Skin warm and dry Psychiatric: normal affect, pleasant  Recent Labs: 04/20/2020: BNP 92.2; BUN 17; Creatinine, Ser 1.34; Potassium 4.2; Sodium 145    Lipid Panel Lab Results  Component Value Date   CHOL 129 01/29/2020   HDL 50 01/29/2020   LDLCALC 56 01/29/2020   TRIG 135 01/29/2020      Wt Readings from Last 3 Encounters:  08/06/20 201 lb (91.2 kg)  04/20/20 203 lb 4 oz (92.2 kg)  01/29/20 206 lb (93.4 kg)      ASSESSMENT AND PLAN:  Essential hypertension Blood pressure is well controlled on today's visit. No changes made to the medications.  Atherosclerosis of both carotid arteries Cholesterol at goal Low-grade disease less than 39% disease bilaterally in July 2021  Class 2 obesity due to excess calories without serious comorbidity with body mass  index (BMI) of 35.0 to 35.9 in adult We have encouraged continued exercise, careful diet management in an effort to lose weight.  Coronary artery disease involving native coronary artery of native heart without angina pectoris Elevated CT coronary calcium score Cholesterol at goal Denies anginal symptoms  Mixed hyperlipidemia Recommend weight loss, continue Crestor Goal LDL less than 70  Apical thickening  moderate to severe apical thickening  Seen by EP, no ICD indicated   Total encounter time more than 25 minutes  Greater than 50% was spent in counseling and coordination of care with the patient    No orders of the defined types were placed in this encounter.    Signed, Dossie Arbour, M.D., Ph.D. 08/06/2020  [facility] [place], [place] [phone]

## 2020-08-06 ENCOUNTER — Ambulatory Visit (INDEPENDENT_AMBULATORY_CARE_PROVIDER_SITE_OTHER): Payer: Medicare Other | Admitting: Cardiovascular Disease

## 2020-08-06 ENCOUNTER — Encounter: Payer: Self-pay | Admitting: Cardiovascular Disease

## 2020-08-06 ENCOUNTER — Other Ambulatory Visit: Payer: Self-pay

## 2020-08-06 VITALS — BP 124/78 | HR 72 | Ht 71.0 in | Wt 201.0 lb

## 2020-08-06 DIAGNOSIS — I472 Ventricular tachycardia: Secondary | ICD-10-CM

## 2020-08-06 DIAGNOSIS — I2510 Atherosclerotic heart disease of native coronary artery without angina pectoris: Secondary | ICD-10-CM

## 2020-08-06 DIAGNOSIS — E785 Hyperlipidemia, unspecified: Secondary | ICD-10-CM

## 2020-08-06 DIAGNOSIS — I422 Other hypertrophic cardiomyopathy: Secondary | ICD-10-CM | POA: Diagnosis not present

## 2020-08-06 DIAGNOSIS — R0609 Other forms of dyspnea: Secondary | ICD-10-CM

## 2020-08-06 DIAGNOSIS — I4729 Other ventricular tachycardia: Secondary | ICD-10-CM

## 2020-08-06 DIAGNOSIS — I6523 Occlusion and stenosis of bilateral carotid arteries: Secondary | ICD-10-CM

## 2020-08-06 DIAGNOSIS — R0600 Dyspnea, unspecified: Secondary | ICD-10-CM | POA: Diagnosis not present

## 2020-08-06 MED ORDER — ROSUVASTATIN CALCIUM 20 MG PO TABS: 20.0000 mg | ORAL_TABLET | Freq: Every day | ORAL | 3 refills | Status: DC

## 2020-08-06 MED ORDER — AMLODIPINE BESYLATE 10 MG PO TABS: 10.0000 mg | ORAL_TABLET | Freq: Every day | ORAL | 3 refills | Status: DC

## 2020-08-06 MED ORDER — LOSARTAN POTASSIUM 50 MG PO TABS: 50.0000 mg | ORAL_TABLET | Freq: Every day | ORAL | 3 refills | Status: DC

## 2020-08-06 MED ORDER — SPIRONOLACTONE 25 MG PO TABS: 25.0000 mg | ORAL_TABLET | Freq: Every day | ORAL | 3 refills | Status: DC

## 2020-08-06 NOTE — Patient Instructions (Signed)
Medication Instructions:  No changes  If you need a refill on your cardiac medications before your next appointment, please call your pharmacy.    Lab work: No new labs needed   If you have labs (blood work) drawn today and your tests are completely normal, you will receive your results only by: . MyChart Message (if you have MyChart) OR . A paper copy in the mail If you have any lab test that is abnormal or we need to change your treatment, we will call you to review the results.   Testing/Procedures: No new testing needed   Follow-Up: At [facility], you and your health needs are our priority.  As part of our continuing mission to provide you with exceptional heart care, we have created designated Provider Care Teams.  These Care Teams include your primary Cardiologist (physician) and Advanced Practice Providers (APPs -  Physician Assistants and Nurse Practitioners) who all work together to provide you with the care you need, when you need it.  . You will need a follow up appointment in 12 months  . Providers on your designated Care Team:   . Christopher Berge, NP . Ryan Dunn, PA-C . Jacquelyn Visser, PA-C  Any Other Special Instructions Will Be Listed Below (If Applicable).  COVID-19 Vaccine Information can be found at: [email or web address] For questions related to vaccine distribution or appointments, please email [email or web address] or call [phone].     

## 2020-08-09 ENCOUNTER — Telehealth: Payer: Self-pay | Admitting: Cardiovascular Disease

## 2020-08-09 NOTE — Telephone Encounter (Signed)
Patient called in to see if we could please get prescriptions sent over to [facility] in [place] at [facility]. Called pharmacy and will have them transferred over. No other needs at this time.

## 2020-08-18 LAB — CUP PACEART REMOTE DEVICE CHECK
Date Time Interrogation Session: 20211101233201
Implantable Pulse Generator Implant Date: 20180716

## 2020-08-23 ENCOUNTER — Ambulatory Visit (INDEPENDENT_AMBULATORY_CARE_PROVIDER_SITE_OTHER): Payer: Medicare Other

## 2020-08-23 DIAGNOSIS — R55 Syncope and collapse: Secondary | ICD-10-CM

## 2020-08-23 NOTE — Progress Notes (Signed)
Carelink Summary Report / Loop Recorder 

## 2020-09-13 ENCOUNTER — Ambulatory Visit (INDEPENDENT_AMBULATORY_CARE_PROVIDER_SITE_OTHER): Payer: Medicare Other | Admitting: Allergy

## 2020-09-13 ENCOUNTER — Other Ambulatory Visit: Payer: Self-pay

## 2020-09-13 ENCOUNTER — Encounter: Payer: Self-pay | Admitting: Allergy

## 2020-09-13 VITALS — BP 124/76 | HR 76 | Temp 97.9°F | Resp 18 | Ht 69.0 in | Wt 201.0 lb

## 2020-09-13 DIAGNOSIS — R0602 Shortness of breath: Secondary | ICD-10-CM

## 2020-09-13 DIAGNOSIS — H1013 Acute atopic conjunctivitis, bilateral: Secondary | ICD-10-CM | POA: Diagnosis not present

## 2020-09-13 DIAGNOSIS — J3089 Other allergic rhinitis: Secondary | ICD-10-CM

## 2020-09-13 MED ORDER — AZELASTINE HCL 0.05 % OP SOLN: 1.0000 [drp] | Freq: Two times a day (BID) | OPHTHALMIC | 5 refills | Status: DC | PRN

## 2020-09-13 NOTE — Progress Notes (Signed)
Follow Up Note  RE: Duane Warren MRN: [id number] DOB: 06/02/1956 Date of Office Visit: 09/13/2020  Referring provider: Mayer Masker, PA-C Primary care provider: Irena Reichmann, DO  Chief Complaint: Follow-up  History of Present Illness: I had the pleasure of seeing Duane Warren for a follow up visit at the [facility] on 09/13/2020. He is a 64 y.o. male, who is being followed for allergic rhinoconjunctivitis and shortness of breath. His previous allergy office visit was on 03/08/2020 with Dr. Selena Batten. Today is a regular follow up visit.  Allergic rhino conjunctivitis  Some watery eyes. Did not get eye drop Rx.  Had eyes checked a few months ago - normal per patient report.  Currently using dymista 1 spray per nostril 1-2 times a day with good benefit. No nosebleeds. No antihistamines use.  Shortness of breath Sometimes he has a clicking sounds at night when he breathes and the nasal spray helps.  Denies any SOB, coughing, wheezing, chest tightness, nocturnal awakenings, ER/urgent care visits or prednisone use since the last visit. No albuterol use since the last visit.   Assessment and Plan: Manmeet is a 64 y.o. male with: Other allergic rhinitis Past history - Rhino conjunctivitis symptoms mainly during the spring and summer. 2021 skin testing showed: Positive to perennial mold. Interim history - doing well with below regimen. Some watery eyes. Did not get eye drop rx. Eye check this year - unremarkable.   Continue environmental control measures - wear a mask when mowing the grass. Rinse your sinuses out with saline spray after mowing.   Continue dymista (fluticasone + azelastine nasal spray combination) 1 spray per nostril twice a day.  Nasal saline spray (i.e., Simply Saline) or nasal saline lavage (i.e., NeilMed) is recommended as needed and prior to medicated nasal sprays.  May use over the counter antihistamines such as Zyrtec (cetirizine),  Claritin (loratadine), Allegra (fexofenadine), or Xyzal (levocetirizine) daily as needed. May take twice a day during flares.   May use azelastine eye drops twice a day as needed for itchy/watery eyes.  Allergic conjunctivitis of both eyes  See assessment and plan as above for allergic rhinitis.   Shortness of breath Past history - Some shortness of breath at times. 2021 spirometry showed: possible restrictive disease with 8% improvement in FEV1 post bronchodilator treatment and feeling better. Interim history - Denies any symptoms and no albuterol use since last OV.  May use albuterol rescue inhaler 2 puffs every 4 to 6 hours as needed for shortness of breath, chest tightness, coughing, and wheezing. May use albuterol rescue inhaler 2 puffs 5 to 15 minutes prior to strenuous physical activities. Monitor frequency of use.   Will get spirometry at next visit instead of today due to COVID-19 pandemic and trying to minimize any type of aerosolizing procedures at this time in the office.   Return in about 1 year (around 09/13/2021).  Meds ordered this encounter  Medications  . azelastine (OPTIVAR) 0.05 % ophthalmic solution    Sig: Place 1 drop into both eyes 2 (two) times daily as needed (itchy/watery eyes).    Dispense:  6 mL    Refill:  5   Diagnostics: None.  Medication List:  Current Outpatient Medications  Medication Sig Dispense Refill  . albuterol (VENTOLIN HFA) 108 (90 Base) MCG/ACT inhaler INHALE 2 PUFFS INTO THE LUNGS EVERY 4 (FOUR) HOURS AS NEEDED FOR WHEEZING OR SHORTNESS OF BREATH. 18 g 1  . amLODipine (NORVASC) 10 MG tablet Take  1 tablet (10 mg total) by mouth daily. 90 tablet 3  . Azelastine-Fluticasone 137-50 MCG/ACT SUSP Place 1 spray into the nose in the morning and at bedtime. 23 g 5  . Cholecalciferol (VITAMIN D) 2000 units tablet Take 4,000 Units by mouth daily.     [name] losartan (COZAAR) 50 MG tablet Take 1 tablet (50 mg total) by mouth daily. 90 tablet 3  .  rosuvastatin (CRESTOR) 20 MG tablet Take 1 tablet (20 mg total) by mouth at bedtime. 90 tablet 3  . spironolactone (ALDACTONE) 25 MG tablet Take 1 tablet (25 mg total) by mouth daily. 90 tablet 3  . azelastine (OPTIVAR) 0.05 % ophthalmic solution Place 1 drop into both eyes 2 (two) times daily as needed (itchy/watery eyes). 6 mL 5  . oxybutynin (DITROPAN-XL) 10 MG 24 hr tablet Take 10 mg by mouth daily. (Patient not taking: Reported on 09/13/2020)     No current facility-administered medications for this visit.   Allergies: No Known Allergies I reviewed his past medical history, social history, family history, and environmental history and no significant changes have been reported from his previous visit.  Review of Systems  Constitutional: Negative for appetite change, chills, fever and unexpected weight change.  HENT: Negative for congestion, postnasal drip and rhinorrhea.   Eyes: Positive for discharge. Negative for itching.  Respiratory: Negative for cough, chest tightness, shortness of breath and wheezing.   Cardiovascular: Negative for chest pain.  Gastrointestinal: Negative for abdominal pain.  Genitourinary: Negative for difficulty urinating.  Skin: Negative for rash.  Allergic/Immunologic: Positive for environmental allergies. Negative for food allergies.  Neurological: Negative for headaches.   Objective: BP 124/76 (BP Location: Left Arm, Patient Position: Sitting, Cuff Size: Normal)   Pulse 76   Temp 97.9 F (36.6 C) (Temporal)   Resp 18   Ht 5\' 9"  (1.753 m)   Wt 201 lb (91.2 kg)   SpO2 95%   BMI 29.68 kg/m  Body mass index is 29.68 kg/m. Physical Exam Vitals and nursing note reviewed.  Constitutional:      Appearance: Normal appearance. He is well-developed.  HENT:     Head: Normocephalic and atraumatic.     Right Ear: Tympanic membrane and external ear normal.     Left Ear: Tympanic membrane and external ear normal.     Nose: Nose normal.     Mouth/Throat:      Mouth: Mucous membranes are moist.     Pharynx: Oropharynx is clear.  Eyes:     Conjunctiva/sclera: Conjunctivae normal.     Comments: Clear discharge at the corner of eyes b/l.  Cardiovascular:     Rate and Rhythm: Normal rate and regular rhythm.     Heart sounds: Normal heart sounds. No murmur heard.  No friction rub. No gallop.   Pulmonary:     Effort: Pulmonary effort is normal.     Breath sounds: Normal breath sounds. No wheezing or rales.  Musculoskeletal:     Cervical back: Neck supple.  Skin:    General: Skin is warm.     Findings: No rash.  Neurological:     Mental Status: He is alert and oriented to person, place, and time.  Psychiatric:        Behavior: Behavior normal.    Previous notes and tests were reviewed. The plan was reviewed with the patient/family, and all questions/concerned were addressed.  It was my pleasure to see Duane Warren today and participate in his care. Please feel free to contact  me with any questions or concerns.  Sincerely,  Wyline Mood, DO Allergy & Immunology  [facility] office: [phone] [facility] office: [phone]

## 2020-09-13 NOTE — Assessment & Plan Note (Signed)
Past history - Some shortness of breath at times. 2021 spirometry showed: possible restrictive disease with 8% improvement in FEV1 post bronchodilator treatment and feeling better. Interim history - Denies any symptoms and no albuterol use since last OV.  May use albuterol rescue inhaler 2 puffs every 4 to 6 hours as needed for shortness of breath, chest tightness, coughing, and wheezing. May use albuterol rescue inhaler 2 puffs 5 to 15 minutes prior to strenuous physical activities. Monitor frequency of use.   Will get spirometry at next visit instead of today due to COVID-19 pandemic and trying to minimize any type of aerosolizing procedures at this time in the office.

## 2020-09-13 NOTE — Patient Instructions (Addendum)
Environmental allergies:   Past skin testing showed: Positive to perennial mold.  Continue environmental control measures - wear a mask when mowing the grass. Rinse your sinuses out with saline spray after mowing.   Continue dymista (fluticasone + azelastine nasal spray combination) 1 spray per nostril twice a day.  Nasal saline spray (i.e., Simply Saline) or nasal saline lavage (i.e., NeilMed) is recommended as needed and prior to medicated nasal sprays.  May use over the counter antihistamines such as Zyrtec (cetirizine), Claritin (loratadine), Allegra (fexofenadine), or Xyzal (levocetirizine) daily as needed. May take twice a day during flares.   May use azelastine eye drops twice a day as needed for itchy/watery eyes.  Breathing:   May use albuterol rescue inhaler 2 puffs or nebulizer every 4 to 6 hours as needed for shortness of breath, chest tightness, coughing, and wheezing. May use albuterol rescue inhaler 2 puffs 5 to 15 minutes prior to strenuous physical activities. Monitor frequency of use.   Follow up in 12 months or sooner if needed.   Mold Control  Mold and fungi can grow on a variety of surfaces provided certain temperature and moisture conditions exist.   Outdoor molds grow on plants, decaying vegetation and soil. The major outdoor mold, Alternaria and Cladosporium, are found in very high numbers during hot and dry conditions. Generally, a late summer - fall peak is seen for common outdoor fungal spores. Rain will temporarily lower outdoor mold spore count, but counts rise rapidly when the rainy period ends.  The most important indoor molds are Aspergillus and Penicillium. Dark, humid and poorly ventilated basements are ideal sites for mold growth. The next most common sites of mold growth are the bathroom and the kitchen. Outdoor (Seasonal) Mold Control  Use air conditioning and keep windows closed.  Avoid exposure to decaying vegetation.  Avoid leaf  raking.  Avoid grain handling.  Consider wearing a face mask if working in moldy areas.  Indoor (Perennial) Mold Control   Maintain humidity below 50%.  Get rid of mold growth on hard surfaces with water, detergent and, if necessary, 5% bleach (do not mix with other cleaners). Then dry the area completely. If mold covers an area more than 10 square feet, consider hiring an indoor environmental professional.  For clothing, washing with soap and water is best. If moldy items cannot be cleaned and dried, throw them away.  Remove sources e.g. contaminated carpets.  Repair and seal leaking roofs or pipes. Using dehumidifiers in damp basements may be helpful, but empty the water and clean units regularly to prevent mildew from forming. All rooms, especially basements, bathrooms and kitchens, require ventilation and cleaning to deter mold and mildew growth. Avoid carpeting on concrete or damp floors, and storing items in damp areas.

## 2020-09-13 NOTE — Assessment & Plan Note (Signed)
Past history - Rhino conjunctivitis symptoms mainly during the spring and summer. 2021 skin testing showed: Positive to perennial mold. Interim history - doing well with below regimen. Some watery eyes. Did not get eye drop rx. Eye check this year - unremarkable.   Continue environmental control measures - wear a mask when mowing the grass. Rinse your sinuses out with saline spray after mowing.   Continue dymista (fluticasone + azelastine nasal spray combination) 1 spray per nostril twice a day.  Nasal saline spray (i.e., Simply Saline) or nasal saline lavage (i.e., NeilMed) is recommended as needed and prior to medicated nasal sprays.  May use over the counter antihistamines such as Zyrtec (cetirizine), Claritin (loratadine), Allegra (fexofenadine), or Xyzal (levocetirizine) daily as needed. May take twice a day during flares.   May use azelastine eye drops twice a day as needed for itchy/watery eyes.

## 2020-09-13 NOTE — Assessment & Plan Note (Signed)
   See assessment and plan as above for allergic rhinitis.  

## 2020-09-25 LAB — CUP PACEART REMOTE DEVICE CHECK
Date Time Interrogation Session: 20211204223508
Implantable Pulse Generator Implant Date: 20180716

## 2020-09-27 ENCOUNTER — Ambulatory Visit (INDEPENDENT_AMBULATORY_CARE_PROVIDER_SITE_OTHER): Payer: Medicare Other

## 2020-09-27 DIAGNOSIS — R55 Syncope and collapse: Secondary | ICD-10-CM

## 2020-10-11 NOTE — Progress Notes (Signed)
Carelink Summary Report / Loop Recorder 

## 2020-11-01 ENCOUNTER — Ambulatory Visit (INDEPENDENT_AMBULATORY_CARE_PROVIDER_SITE_OTHER): Payer: Medicare Other

## 2020-11-01 DIAGNOSIS — R55 Syncope and collapse: Secondary | ICD-10-CM

## 2020-11-01 LAB — CUP PACEART REMOTE DEVICE CHECK
Date Time Interrogation Session: 20220115233533
Implantable Pulse Generator Implant Date: 20180716

## 2020-11-05 ENCOUNTER — Ambulatory Visit: Payer: No Typology Code available for payment source | Admitting: Neurology

## 2020-11-09 ENCOUNTER — Ambulatory Visit: Payer: No Typology Code available for payment source | Admitting: Neurology

## 2020-11-09 ENCOUNTER — Telehealth: Payer: Self-pay | Admitting: *Deleted

## 2020-11-09 ENCOUNTER — Encounter: Payer: Self-pay | Admitting: Neurology

## 2020-11-09 NOTE — Telephone Encounter (Signed)
No showed new patient appointment. 

## 2020-11-13 NOTE — Progress Notes (Signed)
Carelink Summary Report / Loop Recorder 

## 2020-12-04 LAB — CUP PACEART REMOTE DEVICE CHECK
Date Time Interrogation Session: 20220217233719
Implantable Pulse Generator Implant Date: 20180716

## 2020-12-06 ENCOUNTER — Ambulatory Visit (INDEPENDENT_AMBULATORY_CARE_PROVIDER_SITE_OTHER): Payer: Medicare Other

## 2020-12-06 DIAGNOSIS — R55 Syncope and collapse: Secondary | ICD-10-CM

## 2020-12-09 NOTE — Progress Notes (Signed)
Carelink Summary Report / Loop Recorder 

## 2020-12-14 ENCOUNTER — Ambulatory Visit: Payer: No Typology Code available for payment source | Admitting: Neurology

## 2021-01-08 LAB — CUP PACEART REMOTE DEVICE CHECK
Date Time Interrogation Session: 20220323005527
Implantable Pulse Generator Implant Date: 20180716

## 2021-01-10 ENCOUNTER — Ambulatory Visit (INDEPENDENT_AMBULATORY_CARE_PROVIDER_SITE_OTHER): Payer: Medicare Other

## 2021-01-10 DIAGNOSIS — R55 Syncope and collapse: Secondary | ICD-10-CM

## 2021-01-11 ENCOUNTER — Other Ambulatory Visit: Payer: Self-pay | Admitting: Allergy

## 2021-01-13 ENCOUNTER — Ambulatory Visit (INDEPENDENT_AMBULATORY_CARE_PROVIDER_SITE_OTHER): Payer: Medicare Other | Admitting: Neurology

## 2021-01-13 ENCOUNTER — Other Ambulatory Visit: Payer: Self-pay

## 2021-01-13 ENCOUNTER — Encounter: Payer: Self-pay | Admitting: Neurology

## 2021-01-13 VITALS — BP 133/84 | HR 71 | Ht 69.0 in | Wt 198.5 lb

## 2021-01-13 DIAGNOSIS — M47812 Spondylosis without myelopathy or radiculopathy, cervical region: Secondary | ICD-10-CM

## 2021-01-13 DIAGNOSIS — R251 Tremor, unspecified: Secondary | ICD-10-CM | POA: Diagnosis not present

## 2021-01-13 MED ORDER — PROPRANOLOL HCL 20 MG PO TABS: 20.0000 mg | ORAL_TABLET | ORAL | 6 refills | Status: DC | PRN

## 2021-01-13 NOTE — Patient Instructions (Signed)
Tonic Water with Quinine

## 2021-01-13 NOTE — Progress Notes (Signed)
Chief Complaint  Patient presents with  . New Patient (Initial Visit)    He is here with his wife, [name]. Reports bilateral hand tremors and cramps. Right side worse than left. Some difficulty with eating and writing.       ASSESSMENT AND PLAN  Duane Warren is a 65 y.o. male   Essential tremor  MRI of the brain showed no significant abnormality  Planning on to have repeat laboratory evaluation including TSH in August 2022 by primary care physician  Inderal 20 mg as needed  Cervical spondylosis  He has history of right cervical radiculopathy, hyperreflexia on examination,  MRI of cervical spine September 2018 showed multilevel degenerative changes, significant right foraminal narrowing, mild canal stenosis at C6-7, with right ventral cord flattening,  But he denies significant pain, no radicular pain, no gait abnormality, keep conservative treatment, avoid excessive neck flexion/extension  DIAGNOSTIC DATA (LABS, IMAGING, TESTING) - I reviewed patient records, labs, notes, testing and imaging myself where available.  MRI cervical in Sept 2018. 1. Mid and lower cervical disc degeneration as described. 2. C5-6 moderate left foraminal stenosis. 3. C6-7 right paracentral disc protrusion with right C7 impingement and mild right ventral cord flattening. 4. 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  MRI brain in July 2021 1. No acute intracranial abnormality. 2. Small, old left cerebellar infarct.  Laboratory evaluation in July 2021: LDL 56, mild elevated creatinine 1.34, GFR 55  HISTORICAL  Duane Warren,is a 65 year old male, seen in request by his primary care doctor collins, Hinton Dyer for evaluation of tremor, initial evaluation was January 13, 2021, he is accompanied by his wife at today's visit.  I reviewed and summarized the referring note.PMHX. HTN HLD S/p Loop record in 2018 for dizziness, no abnormality found.  He has a known history of  right cervical spondylosis, present with right cervical radiculopathy in 2018, MRI of cervical spine then showed multilevel degenerative changes, most noticeable at C6-7 level, right paracentral disc protrusion with right C7 impingement, mild right ventral cord flattening, with treatment, his right arm radicular pain has much improved, but still has intermittent right more than left hand muscle cramping, he denies gait abnormality, urinary frequency, but no incontinence, no persistent upper extremity sensory loss or weakness  Over the past few years, he also developed gradual onset mild bilateral hands tremor, most noticeable at right dominant hand, when he working on small objects, more of a nuisance, there was no limitation in his activity.   REVIEW OF SYSTEMS: Full 14 system review of systems performed and notable only for as above All other review of systems were negative.  PHYSICAL EXAM   Vitals:   01/13/21 1356  BP: 133/84  Pulse: 71  Weight: 198 lb 8 oz (90 kg)  Height: 5\' 9"  (1.753 m)   Not recorded     Body mass index is 29.31 kg/m.  PHYSICAL EXAMNIATION:  Gen: NAD, conversant, well nourised, well groomed                     Cardiovascular: Regular rate rhythm, no peripheral edema, warm, nontender. Eyes: Conjunctivae clear without exudates or hemorrhage Neck: Supple, no carotid bruits. Pulmonary: Clear to auscultation bilaterally   NEUROLOGICAL EXAM:  MENTAL STATUS: Speech:    Speech is normal; fluent and spontaneous with normal comprehension.  Cognition:     Orientation to time, place and person     Normal recent and remote memory     Normal Attention  span and concentration     Normal Language, naming, repeating,spontaneous speech     Fund of knowledge   CRANIAL NERVES: CN II: Visual fields are full to confrontation. Pupils are round equal and briskly reactive to light. CN III, IV, VI: extraocular movement are normal. No ptosis. CN V: Facial sensation is  intact to light touch CN VII: Face is symmetric with normal eye closure  CN VIII: Hearing is normal to causal conversation. CN IX, X: Phonation is normal. CN XI: Head turning and shoulder shrug are intact  MOTOR: He has mild bilateral hand posturing tremor, no weakness, no rigidity no bradykinesia, Mild difficulty drawing spiral circle, left worse than right  REFLEXES: Reflexes are 2+ and symmetric at the biceps, triceps, 3/3 knees, and ankles. Plantar responses are flexor.   SENSORY: Intact to light touch, pinprick and vibratory sensation are intact in fingers and toes.  COORDINATION: There is no trunk or limb dysmetria noted.  GAIT/STANCE: He can get up from seated position arm crossed, steady  ALLERGIES: No Known Allergies  HOME MEDICATIONS: Current Outpatient Medications  Medication Sig Dispense Refill  . albuterol (VENTOLIN HFA) 108 (90 Base) MCG/ACT inhaler INHALE 2 PUFFS INTO THE LUNGS EVERY 4 (FOUR) HOURS AS NEEDED FOR WHEEZING OR SHORTNESS OF BREATH. 18 g 1  . amLODipine (NORVASC) 10 MG tablet Take 1 tablet (10 mg total) by mouth daily. 90 tablet 3  . azelastine (OPTIVAR) 0.05 % ophthalmic solution Place 1 drop into both eyes 2 (two) times daily as needed (itchy/watery eyes). 6 mL 5  . Azelastine-Fluticasone 137-50 MCG/ACT SUSP PLACE 1 SPRAY INTO THE NOSE IN THE MORNING AND AT BEDTIME. 23 g 3  . Cholecalciferol (VITAMIN D) 2000 units tablet Take 4,000 Units by mouth daily.     [name] losartan (COZAAR) 50 MG tablet Take 1 tablet (50 mg total) by mouth daily. 90 tablet 3  . oxybutynin (DITROPAN-XL) 10 MG 24 hr tablet Take 10 mg by mouth daily.    . rosuvastatin (CRESTOR) 20 MG tablet Take 1 tablet (20 mg total) by mouth at bedtime. 90 tablet 3  . spironolactone (ALDACTONE) 25 MG tablet Take 1 tablet (25 mg total) by mouth daily. 90 tablet 3   No current facility-administered medications for this visit.    PAST MEDICAL HISTORY: Past Medical History:  Diagnosis Date  .  Agatston coronary artery calcium score greater than 400    a. 12/2015 Cardiac CT: Ca2+ score of 726 (91st %'ile).  . Anxiety   . Apical variant hypertrophic cardiomyopathy ([place])    a. 01/2016 Echo: EF 55-60%, no rwma, mild AI, Ao root 89mm, mild MR, mod dil LA, mod TR, nl PASP; b. 12/2016 Cardiac MRI: EF 64%, sev apical hypertrophy up to 10mm in diastole. LGE in apical inf, lateral, and apical walls; c. 01/2018 Echo: EF 50-55%, gr2 DD. Mod-Sev apical hypertrophy. Mild AI. Mildly dil Ao root (3.8cm) and asc Ao (3.9cm). Sev dil LA. Nl RV fxn.  . Dilated aortic root ([place])    a. 01/2016 Ao root 110mm; b. 12/2016 Cardiac MRI: Mild aneurysmal dil of Asc Ao - 20mm; c. 01/2018 Echo: dil Ao root (3.8cm) and asc Ao (3.9cm).  . Hyperaldosteronism ([place])   . Hypercholesteremia   . Hypertension   . Syncope    a. s/p MDT Linq.  . Tremor     PAST SURGICAL HISTORY: Past Surgical History:  Procedure Laterality Date  . HERNIA REPAIR    . KNEE SURGERY     unknown  which side  . LOOP RECORDER INSERTION N/A 04/30/2017   Procedure: Loop Recorder Insertion;  Surgeon: Deboraha Sprang, MD;  Location: [facility];  Service: Cardiovascular;  Laterality: N/A;  . SKIN GRAFT      FAMILY HISTORY: Family History  Problem Relation Age of Onset  . Hypertension Mother   . Heart attack Father   . Hypertension Father   . Spina bifida Sister     SOCIAL HISTORY: Social History   Socioeconomic History  . Marital status: Married    Spouse name: Not on file  . Number of children: 2  . Years of education: some college  . Highest education level: Not on file  Occupational History  . 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 Smoking status: Never Smoker  . Smokeless tobacco: Never Used  Vaping Use  . Vaping Use: Never used  Substance and Sexual Activity  . Alcohol use: Yes    Alcohol/week: 1.0 standard drink    Types: 1 Glasses of wine per week  .  Drug use: Never  . Sexual activity: Yes    Birth control/protection: None  Other Topics Concern  . Not on file  Social History Narrative   Lives at home with his wife.   No daily use of caffeine.   Right-handed.   Social Determinants of Health   Financial Resource Strain: Not on file  Food Insecurity: Not on file  Transportation Needs: Not on file  Physical Activity: Not on file  Stress: Not on file  Social Connections: Not on file  Intimate Partner Violence: Not on file      Marcial Pacas, M.D. Ph.D.  [facility] [address], [place], [place] [address] Ph: [phone] Fax: [phone]  CC:  Janie Morning, [address] [place],  [place] [id number]  Janie Morning, DO

## 2021-01-19 NOTE — Progress Notes (Signed)
Carelink Summary Report / Loop Recorder 

## 2021-02-07 ENCOUNTER — Ambulatory Visit (INDEPENDENT_AMBULATORY_CARE_PROVIDER_SITE_OTHER): Payer: Medicare Other

## 2021-02-07 DIAGNOSIS — R55 Syncope and collapse: Secondary | ICD-10-CM

## 2021-02-07 LAB — CUP PACEART REMOTE DEVICE CHECK
Date Time Interrogation Session: 20220425005638
Implantable Pulse Generator Implant Date: 20180716

## 2021-02-10 ENCOUNTER — Other Ambulatory Visit: Payer: Self-pay

## 2021-02-10 ENCOUNTER — Encounter: Payer: Self-pay | Admitting: Family Medicine

## 2021-02-10 ENCOUNTER — Ambulatory Visit (INDEPENDENT_AMBULATORY_CARE_PROVIDER_SITE_OTHER): Payer: Medicare Other | Admitting: Family Medicine

## 2021-02-10 VITALS — BP 142/90 | HR 76 | Temp 98.2°F | Resp 18 | Ht 71.5 in | Wt 197.2 lb

## 2021-02-10 DIAGNOSIS — H1013 Acute atopic conjunctivitis, bilateral: Secondary | ICD-10-CM

## 2021-02-10 DIAGNOSIS — J3089 Other allergic rhinitis: Secondary | ICD-10-CM | POA: Diagnosis not present

## 2021-02-10 DIAGNOSIS — R0602 Shortness of breath: Secondary | ICD-10-CM

## 2021-02-10 DIAGNOSIS — U071 COVID-19: Secondary | ICD-10-CM

## 2021-02-10 MED ORDER — PAXLOVID 20 X 150 MG & 10 X 100MG PO TBPK: 1.0000 | ORAL_TABLET | Freq: Two times a day (BID) | ORAL | 0 refills | Status: AC

## 2021-02-10 MED ORDER — AZELASTINE HCL 0.15 % NA SOLN: NASAL | 5 refills | Status: DC

## 2021-02-10 MED ORDER — FLUTICASONE PROPIONATE 50 MCG/ACT NA SUSP: NASAL | 5 refills | Status: DC

## 2021-02-10 MED ORDER — AZELASTINE HCL 0.05 % OP SOLN: 1.0000 [drp] | Freq: Two times a day (BID) | OPHTHALMIC | 5 refills | Status: DC | PRN

## 2021-02-10 MED ORDER — ALBUTEROL SULFATE HFA 108 (90 BASE) MCG/ACT IN AERS: 2.0000 | INHALATION_SPRAY | RESPIRATORY_TRACT | 1 refills | Status: DC | PRN

## 2021-02-10 NOTE — Patient Instructions (Addendum)
COVID Your rapid COVID test was positive in the clinic today Begin Paxlovid one tablet twice a day for the next 5 days Continue to self-isolate for 5 days or longer if you are still feeling symptomatic. Tell people you have been in close contact of your positive covid status. Continue to wear a mask Call the clinic should your symptoms worsen or fail to improve  Allergic rhinitis Continue allergen avoidance measures directed toward mold Continue Flonase 2 sprays in each nostril once a day as needed for stuffy nose Continue azelastine 2 sprays in each nostril twice a day as needed for runny nose Consider saline nasal rinses as needed for nasal symptoms. Use this before any medicated nasal sprays for best result  Allergic conjunctivitis Continue Optivar eyedrops 1 drop in each eye twice a day as needed for red or itchy eyes  Shortness of breath Continue albuterol 2 puffs once every 4 hours as needed for shortness of breath or cough  Call the clinic if this treatment plan is not working well for you  Follow up in 4 weeks or sooner if needed.

## 2021-02-10 NOTE — Progress Notes (Addendum)
[address] Debbora Presto [place] [address] Dept: [phone]  FOLLOW UP NOTE  Patient ID: LEMAN BOLASH, male    DOB: 12/17/1955  Age: 65 y.o. MRN: [id number] Date of Office Visit: 02/10/2021  Assessment  Chief Complaint: Allergic Rhinitis  (Started Tuesday night -throat clearing, coughing, drainage down throat. Took allegra once )  HPI EYMEN HARGRAVES is a 65 year old male who presents the clinic for acute evaluation of symptoms that began on Tuesday morning.  He was last seen in this clinic on 09/13/2020 by Dr. Selena Batten for evaluation of allergic rhinitis, allergic conjunctivitis, and shortness of breath.  He and his wife want to large weddings this weekend.  He reports that he began to experience symptoms on Monday night which worsened on Tuesday when he reports he had been working outside in the grass.  His symptoms at that time included cough that is dry, tingle in his throat, and postnasal drainage, and chills that began on Wednesday.  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  Allergic rhinitis is reported as poorly controlled with clear yellow drainage moderate nasal congestion, postnasal drainage and occasional sneezing.  He continues Allegra and Flonase nasal spray with poor application technique.  Allergic conjunctivitis is reported as moderately controlled with red, itchy, watery eyes for which she is not currently using any medical intervention.  He denies shortness of breath and wheeze with activity or rest.  He does report cough that is dry.  He has not used his albuterol last about 1 month ago with relief of symptoms.  He denies reflux symptoms including heartburn or vomiting.  He is not currently taking any medication to control reflux.  His current medications are listed in the chart.   Drug Allergies:  No Known Allergies  Physical Exam: BP (!) 142/90   Pulse 76   Temp 98.2 F (36.8 C)   Resp 18   Ht 5'  11.5" (1.816 m)   Wt 197 lb 3.2 oz (89.4 kg)   SpO2 96%   BMI 27.12 kg/m    Physical Exam Vitals reviewed.  Constitutional:      Appearance: Normal appearance.  HENT:     Head: Normocephalic and atraumatic.     Right Ear: Tympanic membrane normal.     Left Ear: Tympanic membrane normal.     Nose:     Comments: Bilateral nares erythematous with clear nasal drainage noted.  Pharynx slightly erythematous.  Ears normal.  Eyes slightly injected Cardiovascular:     Rate and Rhythm: Normal rate and regular rhythm.     Heart sounds: Normal heart sounds. No murmur heard.   Pulmonary:     Effort: Pulmonary effort is normal.     Breath sounds: Normal breath sounds.     Comments: Lungs clear to auscultation Musculoskeletal:        General: Normal range of motion.     Cervical back: Normal range of motion and neck supple.  Skin:    General: Skin is warm and dry.  Neurological:     Mental Status: He is alert and oriented to person, place, and time.  Psychiatric:        Mood and Affect: Mood normal.        Behavior: Behavior normal.        Thought Content: Thought content normal.        Judgment: Judgment normal.      Assessment and Plan: 1.  COVID-19   2. Other allergic rhinitis   3. Allergic conjunctivitis of both eyes   4. Shortness of breath     Meds ordered this encounter  Medications  . Azelastine HCl 0.15 % SOLN    Sig: 2 sprays in each nostril twice a day as needed    Dispense:  30 mL    Refill:  5  . fluticasone (FLONASE) 50 MCG/ACT nasal spray    Sig: 2 sprays in each nostril once a day as needed    Dispense:  16 g    Refill:  5  . azelastine (OPTIVAR) 0.05 % ophthalmic solution    Sig: Place 1 drop into both eyes 2 (two) times daily as needed (itchy/watery eyes).    Dispense:  6 mL    Refill:  5  . albuterol (VENTOLIN HFA) 108 (90 Base) MCG/ACT inhaler    Sig: Inhale 2 puffs into the lungs every 4 (four) hours as needed for wheezing or shortness of breath.     Dispense:  18 g    Refill:  1  . Nirmatrelvir & Ritonavir (PAXLOVID) 20 x 150 MG & 10 x 100MG  TBPK    Sig: Take 1 tablet by mouth in the morning and at bedtime for 5 days.    Dispense:  10 tablet    Refill:  0    Patient Instructions  COVID Your rapid COVID test was positive in the clinic today Begin Paxlovid one tablet twice a day for the next 5 days Continue to self-isolate for 5 days or longer if you are still feeling symptomatic. Tell people you have been in close contact of your positive covid status. Continue to wear a mask Call the clinic should your symptoms worsen or fail to improve  Allergic rhinitis Continue allergen avoidance measures directed toward mold Continue Flonase 2 sprays in each nostril once a day as needed for stuffy nose Continue azelastine 2 sprays in each nostril twice a day as needed for runny nose Consider saline nasal rinses as needed for nasal symptoms. Use this before any medicated nasal sprays for best result  Allergic conjunctivitis Continue Optivar eyedrops 1 drop in each eye twice a day as needed for red or itchy eyes  Shortness of breath Continue albuterol 2 puffs once every 4 hours as needed for shortness of breath or cough  Call the clinic if this treatment plan is not working well for you  Follow up in 4 weeks or sooner if needed.    Return in about 4 weeks (around 03/10/2021), or if symptoms worsen or fail to improve.    Thank you for the opportunity to care for this patient.  Please do not hesitate to contact me with questions.  Thermon Leyland, FNP [facility]

## 2021-02-15 ENCOUNTER — Telehealth: Payer: Self-pay

## 2021-02-15 NOTE — Telephone Encounter (Signed)
Please advise to retesting to covid

## 2021-02-15 NOTE — Telephone Encounter (Signed)
Patient called stating he tested positive in our office on last week. Patient is wondering can he be retested by our office.  Please Advise.

## 2021-02-15 NOTE — Telephone Encounter (Signed)
Lm for pt to call us back to see if he needs retesting to return to work

## 2021-02-15 NOTE — Telephone Encounter (Signed)
Does he need to be retested to return to work or something? If he is feeling better then there is no need to retest.  Testing can also be positive for weeks even once you have symptom improvement.

## 2021-02-15 NOTE — Telephone Encounter (Signed)
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 He stated understanding and said he is feeling a lot better at this time. I did tell him to make sure he  washes his hands and wears a mask if he was concerned with getting others sick

## 2021-02-22 ENCOUNTER — Telehealth: Payer: Self-pay | Admitting: Emergency Medicine

## 2021-02-22 NOTE — Telephone Encounter (Addendum)
Loop recorder Alert. Device RRT as of 02/15/21. Home remotes cancelled. Status changed in [facility] to inactive. Return kit sent. Patient given option for explant or to leave device in place.   Patient states he will contact the office if he decides to have the device removed.

## 2021-02-23 NOTE — Progress Notes (Signed)
Carelink Summary Report / Loop Recorder 

## 2021-08-16 ENCOUNTER — Ambulatory Visit (INDEPENDENT_AMBULATORY_CARE_PROVIDER_SITE_OTHER): Payer: Medicare Other | Admitting: Cardiovascular Disease

## 2021-08-16 ENCOUNTER — Other Ambulatory Visit: Payer: Self-pay

## 2021-08-16 ENCOUNTER — Encounter: Payer: Self-pay | Admitting: Cardiovascular Disease

## 2021-08-16 VITALS — BP 130/80 | HR 81 | Ht 71.0 in | Wt 208.0 lb

## 2021-08-16 DIAGNOSIS — I739 Peripheral vascular disease, unspecified: Secondary | ICD-10-CM | POA: Diagnosis not present

## 2021-08-16 DIAGNOSIS — I2510 Atherosclerotic heart disease of native coronary artery without angina pectoris: Secondary | ICD-10-CM | POA: Diagnosis not present

## 2021-08-16 DIAGNOSIS — E785 Hyperlipidemia, unspecified: Secondary | ICD-10-CM

## 2021-08-16 DIAGNOSIS — I6523 Occlusion and stenosis of bilateral carotid arteries: Secondary | ICD-10-CM | POA: Diagnosis not present

## 2021-08-16 DIAGNOSIS — I4729 Other ventricular tachycardia: Secondary | ICD-10-CM | POA: Diagnosis not present

## 2021-08-16 DIAGNOSIS — I422 Other hypertrophic cardiomyopathy: Secondary | ICD-10-CM

## 2021-08-16 DIAGNOSIS — R0609 Other forms of dyspnea: Secondary | ICD-10-CM

## 2021-08-16 DIAGNOSIS — I10 Essential (primary) hypertension: Secondary | ICD-10-CM

## 2021-08-16 MED ORDER — ROSUVASTATIN CALCIUM 20 MG PO TABS: 20.0000 mg | ORAL_TABLET | Freq: Every day | ORAL | 3 refills | Status: DC

## 2021-08-16 MED ORDER — AMLODIPINE BESYLATE 10 MG PO TABS: 10.0000 mg | ORAL_TABLET | Freq: Every day | ORAL | 3 refills | Status: DC

## 2021-08-16 MED ORDER — LOSARTAN POTASSIUM 50 MG PO TABS: 50.0000 mg | ORAL_TABLET | Freq: Every day | ORAL | 3 refills | Status: DC

## 2021-08-16 MED ORDER — SPIRONOLACTONE 25 MG PO TABS: 25.0000 mg | ORAL_TABLET | Freq: Every day | ORAL | 3 refills | Status: DC

## 2021-08-16 NOTE — Patient Instructions (Addendum)
We will request labs from PMD   Your LDL is 60 (in 2021) Guidelines say get it less than 70 Options to drop more , add zetia daily  Medication Instructions:  No changes  If you need a refill on your cardiac medications before your next appointment, please call your pharmacy.   Lab work: No new labs needed  Testing/Procedures: No new testing needed  Follow-Up: At [facility], you and your health needs are our priority.  As part of our continuing mission to provide you with exceptional heart care, we have created designated Provider Care Teams.  These Care Teams include your primary Cardiologist (physician) and Advanced Practice Providers (APPs -  Physician Assistants and Nurse Practitioners) who all work together to provide you with the care you need, when you need it.  You will need a follow up appointment in 12 months  Providers on your designated Care Team:   Murray Hodgkins, NP Christell Faith, PA-C Cadence Kathlen Mody, [place]   COVID-19 Vaccine Information can be found at: [email or web address] For questions related to vaccine distribution or appointments, please email [email or web address] or call [phone].

## 2021-08-16 NOTE — Progress Notes (Signed)
Cardiology Office Note  Date:  08/16/2021   ID:  Duane Warren, DOB [birth year], MRN [id number]  PCP:  Duane Reichmann, DO   Chief Complaint  Patient presents with   12 month follow up     Patient c/o shortness of breath when bending over to tie his shoe. Medications reviewed by the patient verbally.     HPI:  Duane Warren is a pleasant 65 year old gentleman with CAD,  history of gout, hernia repair, knee arthroscopy.  history of primary aldosteronism ,on Aldactone. Apical hypertrophy Nonsustained VT Last visit March 2017 had CT coronary calcium score Score of 726, Mildly dilated ascending aorta 41 mm,  He presents today for follow-up of his Coronary artery disease, apical hypertrophy on echocardiogram  In follow-up today reports doing relatively well Sedentary no regular exercise program  Labs with PMD, have been requested Will need to confirm lipid panel Numbers  Having some cramps in legs Better with poweraide On Crestor 20  Has loop in place Battery expired, reluctant to take it out Was told by EP he could leave it in  Loop downloads reviewed, no significant arrhythmia noted seen by Dr. Graciela Husbands July 2021  Weight trending upwards, some shortness of breath with bending over  EKG personally reviewed by myself on todays visit Normal sinus rhythm rate 81 bpm diffuse ST-T wave abnormality precordial leads, 1, 2, aVL, aVF  For previous shortness of breath symptoms, Stress test ordered, no significant ischemia  Echo ordered Ejection fraction 50 to 55%, moderate to severe left ventricular hypertrophy of the apical segment Given borderline LV dysfunction, ICD felt to be not justified  other past medical history  previouslyld that he might have depression and was started on a medication for depression. He did not feel well and was weaned off the medication. It was recommended that he start an alternate medication but he did not start this. He reports that since then, his  dizziness has resolved.     he had extensive lab work in March 2014. Total cholesterol 189, HDL 56, LDL 104, lipoprotein (a) 61, CRP 3.4, hemoglobin A1c 5.5, normal LFTs, testosterone 387, Apo B 72 TSH 2.1 Reports that there is mild family history of cancer, hypertension him a CAD Blood pressure at home is typically in the 120-130 range over 60-70    PMH:   has a past medical history of Agatston coronary artery calcium score greater than 400, Anxiety, Apical variant hypertrophic cardiomyopathy (HCC), Dilated aortic root (HCC), Hyperaldosteronism (HCC), Hypercholesteremia, Hypertension, Syncope, and Tremor.  PSH:    Past Surgical History:  Procedure Laterality Date   HERNIA REPAIR     KNEE SURGERY     unknown which side   LOOP RECORDER INSERTION N/A 04/30/2017   Procedure: Loop Recorder Insertion;  Surgeon: [name], MD;  Location: [facility] INVASIVE CV LAB;  Service: Cardiovascular;  Laterality: N/A;   SKIN GRAFT      Current Outpatient Medications  Medication Sig Dispense Refill   albuterol (VENTOLIN HFA) 108 (90 Base) MCG/ACT inhaler Inhale 2 puffs into the lungs every 4 (four) hours as needed for wheezing or shortness of breath. 18 g 1   amLODipine (NORVASC) 10 MG tablet Take 1 tablet (10 mg total) by mouth daily. 90 tablet 3   azelastine (OPTIVAR) 0.05 % ophthalmic solution Place 1 drop into both eyes 2 (two) times daily as needed (itchy/watery eyes). 6 mL 5   Azelastine HCl 0.15 % SOLN 2 sprays in each nostril twice a day as  needed 30 mL 5   Azelastine-Fluticasone 137-50 MCG/ACT SUSP PLACE 1 SPRAY INTO THE NOSE IN THE MORNING AND AT BEDTIME. 23 g 3   Cholecalciferol (VITAMIN D) 2000 units tablet Take 4,000 Units by mouth daily.      colchicine 0.6 MG tablet 2 tablets at onset of symptoms     fluticasone (FLONASE) 50 MCG/ACT nasal spray 2 sprays in each nostril once a day as needed 16 g 5   losartan (COZAAR) 50 MG tablet Take 1 tablet (50 mg total) by mouth daily. 90 tablet 3    rosuvastatin (CRESTOR) 20 MG tablet Take 1 tablet (20 mg total) by mouth at bedtime. 90 tablet 3   spironolactone (ALDACTONE) 25 MG tablet Take 1 tablet (25 mg total) by mouth daily. 90 tablet 3   oxybutynin (DITROPAN-XL) 10 MG 24 hr tablet Take 10 mg by mouth daily. (Patient not taking: No sig reported)     propranolol (INDERAL) 20 MG tablet Take 1 tablet (20 mg total) by mouth as needed. (Patient not taking: No sig reported) 30 tablet 6   No current facility-administered medications for this visit.     Allergies:   Patient has no known allergies.   Social History:  The patient  reports that he has never smoked. He has never used smokeless tobacco. He reports current alcohol use of about 1.0 standard drink per week. He reports that he does not use drugs.   Family History:   family history includes Heart attack in his father; Hypertension in his father and mother; Spina bifida in his sister.    Review of Systems: Review of Systems  Constitutional: Negative.   HENT: Negative.    Respiratory: Negative.    Cardiovascular: Negative.   Gastrointestinal: Negative.   Musculoskeletal: Negative.   Neurological: Negative.   Psychiatric/Behavioral: Negative.    All other systems reviewed and are negative.   PHYSICAL EXAM: VS:  BP 130/80 (BP Location: Left Arm, Patient Position: Sitting, Cuff Size: Normal)   Pulse 81   Ht 5\' 11"  (1.803 m)   Wt 208 lb (94.3 kg)   SpO2 98%   BMI 29.01 kg/m  , BMI Body mass index is 29.01 kg/m. Constitutional:  oriented to person, place, and time. No distress.  HENT:  Head: Grossly normal Eyes:  no discharge. No scleral icterus.  Neck: No JVD, no carotid bruits  Cardiovascular: Regular rate and rhythm, no murmurs appreciated Pulmonary/Chest: Clear to auscultation bilaterally, no wheezes or rails Abdominal: Soft.  no distension.  no tenderness.  Musculoskeletal: Normal range of motion Neurological:  normal muscle tone. Coordination normal. No  atrophy Skin: Skin warm and dry Psychiatric: normal affect, pleasant  Recent Labs: No results found for requested labs within last 8760 hours.    Lipid Panel Lab Results  Component Value Date   CHOL 129 01/29/2020   HDL 50 01/29/2020   LDLCALC 56 01/29/2020   TRIG 135 01/29/2020      Wt Readings from Last 3 Encounters:  08/16/21 208 lb (94.3 kg)  02/10/21 197 lb 3.2 oz (89.4 kg)  01/13/21 198 lb 8 oz (90 kg)      ASSESSMENT AND PLAN:  Essential hypertension Blood pressure is well controlled on today's visit. No changes made to the medications.  Atherosclerosis of both carotid arteries Lab work quested from primary care to confirm adequate lipid management Low-grade disease less than 39% disease bilaterally in July 2021  Class 2 obesity due to excess calories without serious comorbidity with body  mass index (BMI) of 35.0 to 35.9 in adult We have encouraged continued exercise, careful diet management in an effort to lose weight.  Coronary artery disease involving native coronary artery of native heart without angina pectoris Elevated CT coronary calcium score Cholesterol at goal in the past Recommended weight loss, walking program  Mixed hyperlipidemia Continue Crestor, lipids requested from primary care For LDL above 70 would add Zetia 10 mg daily  Apical thickening  moderate to severe apical thickening  Seen by EP, no ICD indicated Has a loop monitor in place for management of nonsustained VT, battery now dead.  Inclined not to take it out   Total encounter time more than 25 minutes  Greater than 50% was spent in counseling and coordination of care with the patient    Orders Placed This Encounter  Procedures   EKG 12-Lead      Signed, Dossie Arbour, M.D., Ph.D. 08/16/2021  [facility] [place], [place] [phone]

## 2021-12-26 ENCOUNTER — Encounter: Payer: Self-pay | Admitting: Cardiovascular Disease

## 2021-12-26 DIAGNOSIS — E785 Hyperlipidemia, unspecified: Secondary | ICD-10-CM

## 2021-12-26 DIAGNOSIS — I6523 Occlusion and stenosis of bilateral carotid arteries: Secondary | ICD-10-CM

## 2021-12-26 DIAGNOSIS — I2510 Atherosclerotic heart disease of native coronary artery without angina pectoris: Secondary | ICD-10-CM

## 2021-12-26 NOTE — Telephone Encounter (Signed)
Patient calling to check on status.

## 2022-01-11 MED ORDER — ROSUVASTATIN CALCIUM 10 MG PO TABS: 10.0000 mg | ORAL_TABLET | Freq: Every day | ORAL | 3 refills | Status: DC

## 2022-01-11 MED ORDER — EZETIMIBE 10 MG PO TABS: 10.0000 mg | ORAL_TABLET | Freq: Every day | ORAL | 3 refills | Status: DC

## 2022-05-05 ENCOUNTER — Telehealth: Payer: Self-pay

## 2022-05-05 ENCOUNTER — Telehealth: Payer: Self-pay | Admitting: Cardiovascular Disease

## 2022-05-05 DIAGNOSIS — E785 Hyperlipidemia, unspecified: Secondary | ICD-10-CM

## 2022-05-05 DIAGNOSIS — I6523 Occlusion and stenosis of bilateral carotid arteries: Secondary | ICD-10-CM

## 2022-05-05 DIAGNOSIS — I2510 Atherosclerotic heart disease of native coronary artery without angina pectoris: Secondary | ICD-10-CM

## 2022-05-05 NOTE — Telephone Encounter (Signed)
Pt c/o medication issue:  1. Name of Medication: rosuvastatin (CRESTOR) 10 MG tablet  2. How are you currently taking this medication (dosage and times per day)? Take 1 tablet (10 mg total) by mouth at bedtime.  3. Are you having a reaction (difficulty breathing--STAT)? Yes  4. What is your medication issue? Patient is having the same issue as before even though it has been reduced

## 2022-05-05 NOTE — Telephone Encounter (Signed)
Patient called in canceling his 1 year appointment because he doesn't need it since he plans not to get his ILR explanted. I have canceled the appt just wanted to make you aware in case it was another reason he needed to be seen

## 2022-05-05 NOTE — Telephone Encounter (Signed)
Spoke with patient and he reports cramping which is what happened before on higher dose of medication. Advise I would forward this over to Dr. Rockey Situ for his review and any further recommendations. He verbalized understanding with no further questions at this time.

## 2022-05-05 NOTE — Telephone Encounter (Signed)
Pt sees Dr Rockey Situ yearly and due 08/2022

## 2022-05-09 ENCOUNTER — Encounter: Payer: Medicare Other | Admitting: Internal Medicine

## 2022-05-09 MED ORDER — ROSUVASTATIN CALCIUM 5 MG PO TABS: 5.0000 mg | ORAL_TABLET | Freq: Every day | ORAL | 3 refills | Status: DC

## 2022-05-09 NOTE — Telephone Encounter (Signed)
Perhaps he could try Crestor 5 mg daily or 10 mg every other day  We will stay on the Zetia  If unable to tolerate Crestor, may need to change to injectable medication, PCSK9 inhibitor such as Repatha  Thx  TGollan   Called and spoke with patient. Went over recommendations. Patient stated that he will try that and let us know if he continues to have issues with cramping.

## 2022-05-23 ENCOUNTER — Telehealth: Payer: Self-pay | Admitting: Internal Medicine

## 2022-05-23 NOTE — Telephone Encounter (Signed)
Noted  

## 2022-05-23 NOTE — Telephone Encounter (Signed)
Patient declined recall per cancellation notes

## 2022-06-14 ENCOUNTER — Telehealth: Payer: Self-pay | Admitting: Pharmacist

## 2022-06-14 NOTE — Telephone Encounter (Signed)
Our research practice, [facility], is conducting a phase 3 trial with Novo Nordisk evaluating an IL-6 antagonist, Ziltivekimab, on MACE endpoints in patients with ASCVD, CKD and systemic inflmmation. Ziltivekimab is administered '15mg'$  SQ Qmonth and will be compared to placebo. In a previous trial known as CANTOS, IL-6 antagonist tx in patients with CVD & elevated hs-CRP showed a risk reduction of MACE by 32%, with a 52% reduction in CV mortality. Dr. Adrian Prows at [facility] is the PI. Mr. Stovall was referred to the study by his PCP, Dr. Janie Morning. He is interested in participating but prior to moving forward, we wanted to check with you to see if you are OK with him participating.  Thanks  [employer]

## 2022-07-07 ENCOUNTER — Other Ambulatory Visit: Payer: Self-pay | Admitting: Cardiovascular Disease

## 2022-07-07 DIAGNOSIS — E785 Hyperlipidemia, unspecified: Secondary | ICD-10-CM

## 2022-07-07 DIAGNOSIS — I6523 Occlusion and stenosis of bilateral carotid arteries: Secondary | ICD-10-CM

## 2022-07-07 DIAGNOSIS — I2510 Atherosclerotic heart disease of native coronary artery without angina pectoris: Secondary | ICD-10-CM

## 2022-08-19 ENCOUNTER — Other Ambulatory Visit: Payer: Self-pay | Admitting: Cardiovascular Disease

## 2022-08-31 ENCOUNTER — Other Ambulatory Visit: Payer: Self-pay | Admitting: Orthopedic Surgery

## 2022-08-31 DIAGNOSIS — M25512 Pain in left shoulder: Secondary | ICD-10-CM

## 2022-08-31 DIAGNOSIS — M12811 Other specific arthropathies, not elsewhere classified, right shoulder: Secondary | ICD-10-CM

## 2022-09-17 ENCOUNTER — Ambulatory Visit
Admission: RE | Admit: 2022-09-17 | Discharge: 2022-09-17 | Disposition: A | Discharge status: Home / Self Care | Payer: Medicare Other | Source: Ambulatory Visit | Attending: Orthopedic Surgery | Admitting: Orthopedic Surgery

## 2022-09-17 DIAGNOSIS — M12812 Other specific arthropathies, not elsewhere classified, left shoulder: Secondary | ICD-10-CM

## 2022-09-17 DIAGNOSIS — M25512 Pain in left shoulder: Secondary | ICD-10-CM

## 2022-10-02 ENCOUNTER — Other Ambulatory Visit: Payer: Self-pay | Admitting: Cardiovascular Disease

## 2022-10-11 ENCOUNTER — Telehealth: Payer: Self-pay | Admitting: Cardiovascular Disease

## 2022-10-11 ENCOUNTER — Encounter: Payer: Self-pay | Admitting: Cardiovascular Disease

## 2022-10-11 MED ORDER — SPIRONOLACTONE 25 MG PO TABS: 25.0000 mg | ORAL_TABLET | Freq: Every day | ORAL | 0 refills | Status: DC

## 2022-10-11 NOTE — Telephone Encounter (Signed)
Error

## 2022-10-11 NOTE — Telephone Encounter (Signed)
Patient called back and requested an emergency supply of Amlodipine as well. He states he is stuck out of town with [place] and did not pack enough Amlodipine to last him for the additional time during his stay.

## 2022-10-11 NOTE — Telephone Encounter (Signed)
*  STAT* If patient is at the pharmacy, call can be transferred to refill team.   1. Which medications need to be refilled? (please list name of each medication and dose if known) spironolactone (ALDACTONE) 25 MG tablet  2. Which pharmacy/location (including street and city if local pharmacy) is medication to be sent to?  [address], [place] [address]  3. Do they need a 30 day or 90 day supply?   30 day supply  Patient states he is completely out of medication and his appointment is scheduled for 10/31/22 with Dr. Rockey Situ.

## 2022-10-15 ENCOUNTER — Other Ambulatory Visit: Payer: Self-pay | Admitting: Cardiovascular Disease

## 2022-10-30 NOTE — Progress Notes (Unsigned)
Cardiology Office Note  Date:  10/31/2022   ID:  NIV LUISI, DOB [birth year], MRN [id number]  PCP:  Irena Reichmann, DO   Chief Complaint  Patient presents with   12 month follow up     Patient had Covid Christmas Day. "Doing well." Medications reviewed by the patient verbally.     HPI:  Mr. Klebe is a pleasant 67 year old gentleman with  CAD,  history of gout, hernia repair, knee arthroscopy.  history of primary aldosteronism ,on Aldactone. apical hypertrophic cardiomyopathy  Nonsustained VT Last visit March 2017 had CT coronary calcium score, Score of 726, Mildly dilated ascending aorta 41 mm,  syncope He presents today for follow-up of his Coronary artery disease, apical hypertrophy on echocardiogram  Last seen by myself in clinic November 2022  Stress test 7/21: no ischemia Shortness of breath with stairs  In follow-up today reports he is recovering from COVID, started several weeks ago Still with cough, no other symptoms  Denies any near-syncope or syncope Has not been exercising on a regular basis  Remains on Crestor 5 mg daily with Zetia 10 mg daily Dose may have been reduced on Crestor from 20 for leg swelling  Has loop in place, battery expired, did not want to take it out seen by Dr. Graciela Husbands July 2021, decision made that ICD was not needed  Weight trending upwards, shortness of breath when bending over to tie his shoes  EKG personally reviewed by myself on todays visit Normal sinus rhythm with T wave abnormality precordial leads, limb leads, baseline artifact, LVH   Echo ordered Ejection fraction 50 to 55%, moderate to severe left ventricular hypertrophy of the apical segment Given borderline LV dysfunction, ICD felt to be not justified  other past medical history  previouslyld that he might have depression and was started on a medication for depression. He did not feel well and was weaned off the medication. It was recommended that he start an  alternate medication but he did not start this. He reports that since then, his dizziness has resolved.     he had extensive lab work in March 2014. Total cholesterol 189, HDL 56, LDL 104, lipoprotein (a) 61, CRP 3.4, hemoglobin A1c 5.5, normal LFTs, testosterone 387, Apo B 72 TSH 2.1 Reports that there is mild family history of cancer, hypertension him a CAD Blood pressure at home is typically in the 120-130 range over 60-70    PMH:   has a past medical history of Agatston coronary artery calcium score greater than 400, Anxiety, Apical variant hypertrophic cardiomyopathy (HCC), Dilated aortic root (HCC), Hyperaldosteronism (HCC), Hypercholesteremia, Hypertension, Syncope, and Tremor.  PSH:    Past Surgical History:  Procedure Laterality Date   HERNIA REPAIR     KNEE SURGERY     unknown which side   LOOP RECORDER INSERTION N/A 04/30/2017   Procedure: Loop Recorder Insertion;  Surgeon: [name], MD;  Location: [facility] INVASIVE CV LAB;  Service: Cardiovascular;  Laterality: N/A;   SKIN GRAFT      Current Outpatient Medications  Medication Sig Dispense Refill   albuterol (VENTOLIN HFA) 108 (90 Base) MCG/ACT inhaler Inhale 2 puffs into the lungs every 4 (four) hours as needed for wheezing or shortness of breath. 18 g 1   alfuzosin (UROXATRAL) 10 MG 24 hr tablet Take 10 mg by mouth daily with breakfast.     allopurinol (ZYLOPRIM) 100 MG tablet Take 100 mg by mouth daily.     amLODipine (NORVASC) 10 MG tablet  TAKE 1 TABLET BY MOUTH EVERY DAY 90 tablet 3   azelastine (OPTIVAR) 0.05 % ophthalmic solution Place 1 drop into both eyes 2 (two) times daily as needed (itchy/watery eyes). 6 mL 5   Azelastine HCl 0.15 % SOLN 2 sprays in each nostril twice a day as needed 30 mL 5   Azelastine-Fluticasone 137-50 MCG/ACT SUSP PLACE 1 SPRAY INTO THE NOSE IN THE MORNING AND AT BEDTIME. 23 g 3   busPIRone (BUSPAR) 5 MG tablet Take 5 mg by mouth 2 (two) times daily.     Cholecalciferol (VITAMIN D) 2000  units tablet Take 4,000 Units by mouth daily.      colchicine 0.6 MG tablet 2 tablets at onset of symptoms     ezetimibe (ZETIA) 10 MG tablet Take 1 tablet (10 mg total) by mouth daily. 90 tablet 3   fluticasone (FLONASE) 50 MCG/ACT nasal spray 2 sprays in each nostril once a day as needed 16 g 5   losartan (COZAAR) 50 MG tablet TAKE 1 TABLET BY MOUTH EVERY DAY 30 tablet 0   oxybutynin (DITROPAN-XL) 10 MG 24 hr tablet Take 10 mg by mouth daily.     propranolol (INDERAL) 20 MG tablet Take 1 tablet (20 mg total) by mouth as needed. 30 tablet 6   rosuvastatin (CRESTOR) 5 MG tablet TAKE 1 TABLET (5 MG TOTAL) BY MOUTH DAILY. 90 tablet 0   sildenafil (VIAGRA) 100 MG tablet Take 50-100 mg by mouth daily as needed.     spironolactone (ALDACTONE) 25 MG tablet TAKE 1 TABLET (25 MG TOTAL) BY MOUTH DAILY. 15 tablet 0   No current facility-administered medications for this visit.     Allergies:   Molds & smuts   Social History:  The patient  reports that he has never smoked. He has never used smokeless tobacco. He reports current alcohol use of about 1.0 standard drink of alcohol per week. He reports that he does not use drugs.   Family History:   family history includes Heart attack in his father; Hypertension in his father and mother; Spina bifida in his sister.    Review of Systems: Review of Systems  Constitutional: Negative.   HENT: Negative.    Respiratory: Negative.    Cardiovascular: Negative.   Gastrointestinal: Negative.   Musculoskeletal: Negative.   Neurological: Negative.   Psychiatric/Behavioral: Negative.    All other systems reviewed and are negative.   PHYSICAL EXAM: VS:  BP 120/60 (BP Location: Left Arm, Patient Position: Sitting, Cuff Size: Normal)   Pulse 74   Ht 5\' 11"  (1.803 m)   Wt 195 lb 4 oz (88.6 kg)   SpO2 98%   BMI 27.23 kg/m  , BMI Body mass index is 27.23 kg/m. Constitutional:  oriented to person, place, and time. No distress.  HENT:  Head: Grossly  normal Eyes:  no discharge. No scleral icterus.  Neck: No JVD, no carotid bruits  Cardiovascular: Regular rate and rhythm, no murmurs appreciated Pulmonary/Chest: Clear to auscultation bilaterally, no wheezes or rails Abdominal: Soft.  no distension.  no tenderness.  Musculoskeletal: Normal range of motion Neurological:  normal muscle tone. Coordination normal. No atrophy Skin: Skin warm and dry Psychiatric: normal affect, pleasant  Recent Labs: No results found for requested labs within last 365 days.    Lipid Panel Lab Results  Component Value Date   CHOL 129 01/29/2020   HDL 50 01/29/2020   LDLCALC 56 01/29/2020   TRIG 135 01/29/2020      Wt  Readings from Last 3 Encounters:  10/31/22 195 lb 4 oz (88.6 kg)  08/16/21 208 lb (94.3 kg)  02/10/21 197 lb 3.2 oz (89.4 kg)     ASSESSMENT AND PLAN:  Essential hypertension Blood pressure is well controlled on today's visit. No changes made to the medications.  Atherosclerosis of both carotid arteries Low-grade disease less than 39% disease bilaterally in July 2021 No further workup at this time  Class 2 obesity due to excess calories without serious comorbidity with body mass index (BMI) of 35.0 to 35.9 in adult We have encouraged continued exercise, careful diet management in an effort to lose weight.  Coronary artery disease involving native coronary artery of native heart without angina pectoris Elevated CT coronary calcium score, 700 Repeat lipid panel with primary care and follow-up  Mixed hyperlipidemia Continue Crestor 5 mg daily with Zetia 10 Goal LDL less than 70  Apical thickening  moderate to severe apical thickening  Seen by EP, no ICD indicated loop monitor previously placed with rare VT, battery now expired, declined explant   Total encounter time more than 30 minutes  Greater than 50% was spent in counseling and coordination of care with the patient    No orders of the defined types were placed in  this encounter.     Signed, Dossie Arbour, M.D., Ph.D. 10/31/2022  [facility] [place], [place] [phone]

## 2022-10-31 ENCOUNTER — Encounter: Payer: Self-pay | Admitting: Cardiovascular Disease

## 2022-10-31 ENCOUNTER — Ambulatory Visit: Payer: Medicare Other | Attending: Cardiovascular Disease | Admitting: Cardiovascular Disease

## 2022-10-31 VITALS — BP 120/60 | HR 74 | Ht 71.0 in | Wt 195.2 lb

## 2022-10-31 DIAGNOSIS — I6523 Occlusion and stenosis of bilateral carotid arteries: Secondary | ICD-10-CM

## 2022-10-31 DIAGNOSIS — I4729 Other ventricular tachycardia: Secondary | ICD-10-CM

## 2022-10-31 DIAGNOSIS — E785 Hyperlipidemia, unspecified: Secondary | ICD-10-CM

## 2022-10-31 DIAGNOSIS — R55 Syncope and collapse: Secondary | ICD-10-CM | POA: Diagnosis present

## 2022-10-31 DIAGNOSIS — I2510 Atherosclerotic heart disease of native coronary artery without angina pectoris: Secondary | ICD-10-CM

## 2022-10-31 DIAGNOSIS — I739 Peripheral vascular disease, unspecified: Secondary | ICD-10-CM | POA: Diagnosis not present

## 2022-10-31 DIAGNOSIS — I422 Other hypertrophic cardiomyopathy: Secondary | ICD-10-CM | POA: Diagnosis not present

## 2022-10-31 DIAGNOSIS — I10 Essential (primary) hypertension: Secondary | ICD-10-CM | POA: Diagnosis present

## 2022-10-31 MED ORDER — SPIRONOLACTONE 25 MG PO TABS: 25.0000 mg | ORAL_TABLET | Freq: Every day | ORAL | 3 refills | Status: DC

## 2022-10-31 MED ORDER — ROSUVASTATIN CALCIUM 5 MG PO TABS: 5.0000 mg | ORAL_TABLET | Freq: Every day | ORAL | 3 refills | Status: DC

## 2022-10-31 MED ORDER — AMLODIPINE BESYLATE 10 MG PO TABS: 10.0000 mg | ORAL_TABLET | Freq: Every day | ORAL | 3 refills | Status: DC

## 2022-10-31 MED ORDER — LOSARTAN POTASSIUM 50 MG PO TABS: 50.0000 mg | ORAL_TABLET | Freq: Every day | ORAL | 3 refills | Status: DC

## 2022-10-31 MED ORDER — EZETIMIBE 10 MG PO TABS: 10.0000 mg | ORAL_TABLET | Freq: Every day | ORAL | 3 refills | Status: DC

## 2022-10-31 MED ORDER — PROPRANOLOL HCL 20 MG PO TABS: 20.0000 mg | ORAL_TABLET | Freq: Three times a day (TID) | ORAL | 6 refills | Status: DC | PRN

## 2022-10-31 NOTE — Patient Instructions (Signed)
Medication Instructions:  No changes  If you need a refill on your cardiac medications before your next appointment, please call your pharmacy.   Lab work: No new labs needed  Testing/Procedures: No new testing needed  Follow-Up: At [facility], you and your health needs are our priority.  As part of our continuing mission to provide you with exceptional heart care, we have created designated Provider Care Teams.  These Care Teams include your primary Cardiologist (physician) and Advanced Practice Providers (APPs -  Physician Assistants and Nurse Practitioners) who all work together to provide you with the care you need, when you need it.  You will need a follow up appointment in 12 months  Providers on your designated Care Team:   Christopher Berge, NP Ryan Dunn, PA-C Cadence Furth, PA-C  COVID-19 Vaccine Information can be found at: [email or web address] For questions related to vaccine distribution or appointments, please email [email or web address] or call [phone].   

## 2022-11-06 ENCOUNTER — Other Ambulatory Visit: Payer: Self-pay | Admitting: Cardiovascular Disease

## 2022-11-13 ENCOUNTER — Other Ambulatory Visit: Payer: Self-pay | Admitting: Cardiovascular Disease

## 2022-11-13 DIAGNOSIS — E785 Hyperlipidemia, unspecified: Secondary | ICD-10-CM

## 2022-11-13 DIAGNOSIS — I6523 Occlusion and stenosis of bilateral carotid arteries: Secondary | ICD-10-CM

## 2022-11-13 DIAGNOSIS — I2510 Atherosclerotic heart disease of native coronary artery without angina pectoris: Secondary | ICD-10-CM

## 2022-12-05 ENCOUNTER — Telehealth: Payer: Self-pay | Admitting: Cardiovascular Disease

## 2022-12-05 NOTE — Telephone Encounter (Signed)
Pt c/o BP issue: STAT if pt c/o blurred vision, one-sided weakness or slurred speech  1. What are your last 5 BP readings?  Laying down- 110/72 Sitting-106/68 Standing-112/76  2. Are you having any other symptoms (ex. Dizziness, headache, blurred vision, passed out)? Dizziness, lightheadedness, feeling like he's going to pass out  3. What is your BP issue? Pt stated BP has been running low for that last 2 weeks.

## 2022-12-05 NOTE — Telephone Encounter (Signed)
Patient went to see his PCP today and she encouraged him to call because it could be cardiology related. He has been having low blood pressures with dizziness, lightheadedness, and feeling like he is going to pass out. Advised those readings are considered in normal range. Confirmed what medications he is taking as follows:  Amlodipine Ezetimibe Losartan Rosuvastatin  Spironolactone  Inquired about the propranolol and he reports not taking this. Encouraged him to monitor his blood pressures the next couple of weeks and let us know how they are running and if his symptoms continue. Advised that I would forward this to Dr. Rockey Situ for his input per PCP request. He verbalized understanding with no further questions at this time.

## 2022-12-06 ENCOUNTER — Encounter: Payer: Self-pay | Admitting: Cardiovascular Disease

## 2022-12-06 NOTE — Telephone Encounter (Signed)
Called to review providers recommendations. Instructed him to stop the amlodipine and give it a week to washout. After that time he will need to monitor those blood pressures and if it should be elevated then we could try restarting that medication at a lower dose. He then wanted to know about propranolol. Reviewed that medication is used for numerous cardiac reasons and there are other uses as well. Providers review medication lists to make sure all medications will not interact with others. He was very appreciative for the call back with no further questions at this time.    [name], MD  Valora Corporal, RN Caller: Unspecified (Yesterday, 11:47 AM) If blood pressure running low we can stop his amlodipine 10 mg daily Give it a week to washout Without the amlodipine if blood pressure starts to run little bit high, we may need to restart 5 mg daily Thx TGollan

## 2022-12-07 ENCOUNTER — Ambulatory Visit (INDEPENDENT_AMBULATORY_CARE_PROVIDER_SITE_OTHER): Payer: Medicare Other | Admitting: Family Medicine

## 2022-12-07 ENCOUNTER — Other Ambulatory Visit: Payer: Self-pay | Admitting: Family Medicine

## 2022-12-07 ENCOUNTER — Other Ambulatory Visit: Payer: Self-pay

## 2022-12-07 ENCOUNTER — Encounter: Payer: Self-pay | Admitting: Family Medicine

## 2022-12-07 VITALS — BP 120/86 | HR 59 | Temp 97.2°F | Resp 16 | Ht 71.0 in | Wt 190.9 lb

## 2022-12-07 DIAGNOSIS — R0602 Shortness of breath: Secondary | ICD-10-CM

## 2022-12-07 DIAGNOSIS — H1013 Acute atopic conjunctivitis, bilateral: Secondary | ICD-10-CM | POA: Diagnosis not present

## 2022-12-07 DIAGNOSIS — J3089 Other allergic rhinitis: Secondary | ICD-10-CM | POA: Diagnosis not present

## 2022-12-07 MED ORDER — MONTELUKAST SODIUM 10 MG PO TABS: 10.0000 mg | ORAL_TABLET | Freq: Every day | ORAL | 5 refills | Status: DC

## 2022-12-07 MED ORDER — XHANCE 93 MCG/ACT NA EXHU: 2.0000 | INHALANT_SUSPENSION | Freq: Two times a day (BID) | NASAL | 2 refills | Status: DC

## 2022-12-07 MED ORDER — AZELASTINE HCL 0.15 % NA SOLN: NASAL | 5 refills | Status: DC

## 2022-12-07 MED ORDER — ALBUTEROL SULFATE HFA 108 (90 BASE) MCG/ACT IN AERS: 2.0000 | INHALATION_SPRAY | RESPIRATORY_TRACT | 1 refills | Status: DC | PRN

## 2022-12-07 NOTE — Progress Notes (Signed)
[address] [place] [address] Dept: [phone]  FOLLOW UP NOTE  Patient ID: Duane Warren, male    DOB: 05/03/1956  Age: 67 y.o. MRN: [id number] Date of Office Visit: 12/07/2022  Assessment  Chief Complaint: Cough and Nasal Congestion (Pt states tt he is wheezing through nostrils x 3 weeks)  HPI Duane Warren is a 67 year old male who presents to the clinic for follow-up visit.  He was last seen in this clinic on 02/10/2021 for evaluation of allergic rhinitis, allergic conjunctivitis, shortness of breath, and was noted to be COVID-positive at that time for which he received Paxlovid.  In the interim, he reports that he did have COVID in January 2024.  At today's visit, he reports that his allergic rhinitis has been poorly controlled with nasal congestion occurring overnight and in the morning which is causing a wheezing noise through his nasal passages.  His main symptoms of allergic rhinitis include nasal congestion, and infrequent sneezing.  He reports the nasal congestion alternates nostrils depending on his position. He is not currently using an antihistamine, Flonase, azelastine, or saline nasal rinses.  His last environmental allergy skin testing was on 01/06/2020 was positive to mold mix 4.  Allergic conjunctivitis is reported as moderately well-controlled with occasional red and itchy eyes for which he is not currently using any medical intervention.  Shortness of breath is reported as moderately well-controlled with symptoms including shortness of breath with vigorous activity and occasional dry cough.  He is not currently using any asthma medications.  His current medications are listed in the chart.   Drug Allergies:  Allergies  Allergen Reactions   Molds & Smuts Other (See Comments)    Physical Exam: BP 120/86   Pulse (!) 59   Temp (!) 97.2 F (36.2 C)   Resp 16   Ht 5\' 11"  (1.803 m)   Wt 190 lb 14.4 oz (86.6 kg)   SpO2 96%   BMI 26.63 kg/m     Physical Exam Vitals reviewed.  Constitutional:      Appearance: Normal appearance.  HENT:     Head: Normocephalic and atraumatic.     Right Ear: Tympanic membrane normal.     Left Ear: Tympanic membrane normal.     Nose:     Comments: Bilateral nares slightly edematous and pale with clear nasal drainage noted.  Pharynx normal.  Ears normal.  Eyes normal.    Mouth/Throat:     Pharynx: Oropharynx is clear.  Eyes:     Conjunctiva/sclera: Conjunctivae normal.  Cardiovascular:     Rate and Rhythm: Normal rate. Rhythm irregular.     Pulses: Normal pulses.  Pulmonary:     Effort: Pulmonary effort is normal.     Breath sounds: Normal breath sounds.     Comments: Lungs clear to auscultation Musculoskeletal:        General: Normal range of motion.     Cervical back: Normal range of motion and neck supple.  Skin:    General: Skin is warm and dry.  Neurological:     Mental Status: He is alert and oriented to person, place, and time.  Psychiatric:        Mood and Affect: Mood normal.        Behavior: Behavior normal.        Thought Content: Thought content normal.        Judgment: Judgment normal.     Diagnostics: FVC 2.02, FEV1 1.53.  Predicted FVC 4.00, predicted  FEV1 3.05.  Spirometry indicates moderate restriction.  Postbronchodilator FVC 1.93, FEV1 1.40.  Postbronchodilator spirometry indicates no improvement in FEV1.  Assessment and Plan: 1. Perennial allergic rhinitis   2. Allergic conjunctivitis of both eyes   3. Shortness of breath     Meds ordered this encounter  Medications   montelukast (SINGULAIR) 10 MG tablet    Sig: Take 1 tablet (10 mg total) by mouth at bedtime.    Dispense:  30 tablet    Refill:  5   Azelastine HCl 0.15 % SOLN    Sig: 2 sprays in each nostril twice a day as needed    Dispense:  30 mL    Refill:  5   albuterol (VENTOLIN HFA) 108 (90 Base) MCG/ACT inhaler    Sig: Inhale 2 puffs into the lungs every 4 (four) hours as needed for wheezing  or shortness of breath.    Dispense:  18 g    Refill:  1   Fluticasone Propionate (XHANCE) 93 MCG/ACT EXHU    Sig: Place 2 sprays into the nose 2 (two) times daily.    Dispense:  16 mL    Refill:  2    Patient Instructions  Allergic rhinitis Continue allergen avoidance measures directed toward mold Begin montelukast 10 mg once a day to control allergy symptoms. Patient cautioned that rarely some children/adults can experience behavioral changes after beginning montelukast. These side effects are rare, however, if you notice any change, notify the clinic and discontinue montelukast. Begin Xhance 2 sprays in each nostril twice a day for nasal congestion Continue azelastine 2 sprays in each nostril twice a day as needed for runny nose Consider saline nasal rinses as needed for nasal symptoms. Use this before any medicated nasal sprays for best result  Allergic conjunctivitis Continue Optivar eyedrops 1 drop in each eye twice a day as needed for red or itchy eyes  Shortness of breath Continue albuterol 2 puffs once every 4 hours as needed for shortness of breath or cough Begin montelukast 10 mg once a day to prevent cough or wheeze Call the clinic if this treatment plan is not working well for you  Follow up in 4 weeks or sooner if needed.   Return in about 4 weeks (around 01/04/2023), or if symptoms worsen or fail to improve.    Thank you for the opportunity to care for this patient.  Please do not hesitate to contact me with questions.  Thermon Leyland, FNP [facility]

## 2022-12-07 NOTE — Patient Instructions (Addendum)
Allergic rhinitis Continue allergen avoidance measures directed toward mold Begin montelukast 10 mg once a day to control allergy symptoms. Patient cautioned that rarely some children/adults can experience behavioral changes after beginning montelukast. These side effects are rare, however, if you notice any change, notify the clinic and discontinue montelukast. Begin Xhance 2 sprays in each nostril twice a day for nasal congestion Continue azelastine 2 sprays in each nostril twice a day as needed for runny nose Consider saline nasal rinses as needed for nasal symptoms. Use this before any medicated nasal sprays for best result  Allergic conjunctivitis Continue Optivar eyedrops 1 drop in each eye twice a day as needed for red or itchy eyes  Shortness of breath Continue albuterol 2 puffs once every 4 hours as needed for shortness of breath or cough Begin montelukast 10 mg once a day to prevent cough or wheeze Call the clinic if this treatment plan is not working well for you  Follow up in 4 weeks or sooner if needed.  Control of Mold Allergen Mold and fungi can grow on a variety of surfaces provided certain temperature and moisture conditions exist.  Outdoor molds grow on plants, decaying vegetation and soil.  The major outdoor mold, Alternaria and Cladosporium, are found in very high numbers during hot and dry conditions.  Generally, a late Summer - Fall peak is seen for common outdoor fungal spores.  Rain will temporarily lower outdoor mold spore count, but counts rise rapidly when the rainy period ends.  The most important indoor molds are Aspergillus and Penicillium.  Dark, humid and poorly ventilated basements are ideal sites for mold growth.  The next most common sites of mold growth are the bathroom and the kitchen.  Outdoor [employer] Use air conditioning and keep windows closed Avoid exposure to decaying vegetation. Avoid leaf raking. Avoid grain handling. Consider wearing a face  mask if working in moldy areas.  Indoor Mold Control Maintain humidity below 50%. Clean washable surfaces with 5% bleach solution. Remove sources e.g. Contaminated carpets.

## 2022-12-07 NOTE — Telephone Encounter (Signed)
Ok to change to levalbuterol. Thank you

## 2022-12-07 NOTE — Telephone Encounter (Signed)
Called and informed patient of change in inhaler. Patient verbalized understanding.

## 2022-12-11 NOTE — Telephone Encounter (Signed)
Thank you :)

## 2022-12-14 ENCOUNTER — Ambulatory Visit (INDEPENDENT_AMBULATORY_CARE_PROVIDER_SITE_OTHER): Payer: Medicare Other | Admitting: Neurology

## 2022-12-14 ENCOUNTER — Encounter: Payer: Self-pay | Admitting: Neurology

## 2022-12-14 VITALS — BP 135/76 | HR 66 | Ht 71.0 in | Wt 190.0 lb

## 2022-12-14 DIAGNOSIS — R251 Tremor, unspecified: Secondary | ICD-10-CM

## 2022-12-14 DIAGNOSIS — R42 Dizziness and giddiness: Secondary | ICD-10-CM | POA: Diagnosis not present

## 2022-12-14 DIAGNOSIS — I6523 Occlusion and stenosis of bilateral carotid arteries: Secondary | ICD-10-CM | POA: Diagnosis not present

## 2022-12-14 DIAGNOSIS — R55 Syncope and collapse: Secondary | ICD-10-CM

## 2022-12-14 NOTE — Progress Notes (Addendum)
Chief Complaint  Patient presents with   Follow-up    Rm 15, alone States tremors in hands have not improved       ASSESSMENT AND PLAN  Duane Warren is a 67 y.o. male   Essential tremor  MRI of the brain showed no significant abnormality  Inderal 20 mg as needed  Presyncope episode  Coronary artery disease, his echocardiogram showed apical moderate to severe thickening in 2021,  High risk for worsening coronary artery disease,  Proceed with echocardiogram, ultrasound of carotid artery  Continue aspirin 81 mg daily  DIAGNOSTIC DATA (LABS, IMAGING, TESTING) - I reviewed patient records, labs, notes, testing and imaging myself where available.  MRI cervical in Sept 2018. 1. Mid and lower cervical disc degeneration as described. 2. C5-6 moderate left foraminal stenosis. 3. C6-7 right paracentral disc protrusion with right C7 impingement and mild right ventral cord flattening. 4. 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  MRI brain in July 2021 1. No acute intracranial abnormality. 2. Small, old left cerebellar infarct.   Laboratory evaluation in July 2021: LDL 56, mild elevated creatinine 1.34, GFR 55   HISTORICAL  Duane Warren,is a 67 year old male, seen in request by his primary care doctor collins, Hinton Dyer for evaluation of tremor, initial evaluation was January 13, 2021, he is accompanied by his wife at today's visit.  I reviewed and summarized the referring note.PMHX. HTN HLD S/p Loop record in 2018 for dizziness, no abnormality found.  He has a known history of right cervical spondylosis, present with right cervical radiculopathy in 2018, MRI of cervical spine then showed multilevel degenerative changes, most noticeable at C6-7 level, right paracentral disc protrusion with right C7 impingement, mild right ventral cord flattening, with treatment, his right arm radicular pain has much improved, but still has intermittent right more than left  hand muscle cramping, he denies gait abnormality, urinary frequency, but no incontinence, no persistent upper extremity sensory loss or weakness  Over the past few years, he also developed gradual onset mild bilateral hands tremor, most noticeable at right dominant hand, when he working on small objects, more of a nuisance, there was no limitation in his activity.  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 [employer], had 1 presyncope episode a month ago, around 11:30 AM, while in the pulpit, standing for 25 minutes delivering message, he suddenly felt lightheaded, vision turned to dark, he did not loss consciousness, able to quickly finish his sentence, sat down, symptoms gradually resolved after drinking some water, eating his breakfast bar, he missed his breath first that morning,  He was seen by cardiologist in the past for slow heart rate, today heart rate was 56, frequent PVCs, previously had loop recorder, lost its function in 2023,  Recent laboratory evaluation by [facility] medical, A1c was mildly elevated 6.1,  Since the event, he was seen by his cardiologist Dr. Rockey Situ on October 31, 2022, made adjustment on his blood pressure medication, stop amlodipine  atherosclerotic disease of bilateral carotid artery, low-grade stenosis, coronary artery disease involving native vessel, elevated CT coronary calcium score, apical thickening, moderate to severe by echocardiogram in 2021,  Still noticed bilateral hand action tremor, no significant worsening,  REVIEW OF SYSTEMS: Full 14 system review of systems performed and notable only for as above All other review of systems were negative.  PHYSICAL EXAM   Vitals:   12/14/22 1511  BP: 135/76  Pulse: 66  Weight: 190 lb (  86.2 kg)  Height: '5\' 11"'$  (1.803 m)      Body mass index is 26.5 kg/m.  PHYSICAL EXAMNIATION:  Gen: NAD, conversant, well nourised, well groomed                     Cardiovascular: Irregular rate rhythm,  no peripheral edema, warm, nontender. Eyes: Conjunctivae clear without exudates or hemorrhage Neck: Supple, no carotid bruits. Pulmonary: Clear to auscultation bilaterally   NEUROLOGICAL EXAM:  MENTAL STATUS: Speech/cognition: Awake, alert, oriented to history taking and care conversation   CRANIAL NERVES: CN II: Visual fields are full to confrontation. Pupils are round equal and briskly reactive to light. CN III, IV, VI: extraocular movement are normal. No ptosis. CN V: Facial sensation is intact to light touch CN VII: Face is symmetric with normal eye closure  CN VIII: Hearing is normal to causal conversation. CN IX, X: Phonation is normal. CN XI: Head turning and shoulder shrug are intact  MOTOR: He has mild bilateral hand posturing tremor, no weakness, no rigidity no bradykinesia, Mild difficulty drawing spiral circle, left worse than right  REFLEXES: Reflexes are 2+ and symmetric at the biceps, triceps, 3/3 knees, and ankles. Plantar responses are flexor.   SENSORY: Intact to light touch, pinprick and vibratory sensation are intact in fingers and toes.  COORDINATION: There is no trunk or limb dysmetria noted.  GAIT/STANCE: He can get up from seated position arm crossed, steady  ALLERGIES: Allergies  Allergen Reactions   Molds & Smuts Other (See Comments)    HOME MEDICATIONS: Current Outpatient Medications  Medication Sig Dispense Refill   allopurinol (ZYLOPRIM) 100 MG tablet Take 100 mg by mouth daily.     azelastine (OPTIVAR) 0.05 % ophthalmic solution Place 1 drop into both eyes 2 (two) times daily as needed (itchy/watery eyes). 6 mL 5   Azelastine HCl 0.15 % SOLN 2 sprays in each nostril twice a day as needed 30 mL 5   Cholecalciferol (VITAMIN D) 2000 units tablet Take 4,000 Units by mouth daily.      ezetimibe (ZETIA) 10 MG tablet Take 1 tablet (10 mg total) by mouth daily. 90 tablet 3   Fluticasone Propionate (XHANCE) 93 MCG/ACT EXHU Place 2 sprays into the  nose 2 (two) times daily. 16 mL 2   levalbuterol (XOPENEX HFA) 45 MCG/ACT inhaler Inhale 2 puffs into the lungs 4 (four) times daily. 15 g 1   losartan (COZAAR) 50 MG tablet Take 1 tablet (50 mg total) by mouth daily. 90 tablet 3   propranolol (INDERAL) 20 MG tablet Take 1 tablet (20 mg total) by mouth 3 (three) times daily as needed. 30 tablet 6   rosuvastatin (CRESTOR) 5 MG tablet Take 1 tablet (5 mg total) by mouth daily. 90 tablet 3   sildenafil (VIAGRA) 100 MG tablet Take 50-100 mg by mouth daily as needed.     spironolactone (ALDACTONE) 25 MG tablet Take 1 tablet (25 mg total) by mouth daily. 90 tablet 3   No current facility-administered medications for this visit.    PAST MEDICAL HISTORY: Past Medical History:  Diagnosis Date   Agatston coronary artery calcium score greater than 400    a. 12/2015 Cardiac CT: Ca2+ score of 726 (91st %'ile).   Anxiety    Apical variant hypertrophic cardiomyopathy ([place])    a. 01/2016 Echo: EF 55-60%, no rwma, mild AI, Ao root 68m, mild MR, mod dil LA, mod TR, nl PASP; b. 12/2016 Cardiac MRI: EF 64%, sev apical  hypertrophy up to 45m in diastole. LGE in apical inf, lateral, and apical walls; c. 01/2018 Echo: EF 50-55%, gr2 DD. Mod-Sev apical hypertrophy. Mild AI. Mildly dil Ao root (3.8cm) and asc Ao (3.9cm). Sev dil LA. Nl RV fxn.   [place]    a. 01/2016 Ao root 375m b. 12/2016 Cardiac MRI: Mild aneurysmal dil of Asc Ao - 4212mc. 01/2018 Echo: dil Ao root (3.8cm) and asc Ao (3.9cm).   [place]osteronism ([place]  Hypercholesteremia    Hypertension    Syncope    a. s/p MDT Linq.   Tremor     PAST SURGICAL HISTORY: Past Surgical History:  Procedure Laterality Date   HERNIA REPAIR     KNEE SURGERY     unknown which side   LOOP RECORDER INSERTION N/A 04/30/2017   Procedure: Loop Recorder Insertion;  Surgeon: KleDeboraha SprangD;  Location: [facility];  Service: Cardiovascular;  Laterality: N/A;   SKIN GRAFT      FAMILY  HISTORY: Family History  Problem Relation Age of Onset   Hypertension Mother    Heart attack Father    Hypertension Father    Spina bifida Sister     SOCIAL HISTORY: Social History   Socioeconomic History   Marital status: Married    Spouse name: Not on file   Number of children: 2   Years of education: some college   Highest education level: Not on file  Occupational History   Occupation: retired    Comment: [place], [employer]    Comment: pastor  Tobacco Use   Smoking status: Never   Smokeless tobacco: Never  Vaping Use   Vaping Use: Never used  Substance and Sexual Activity   Alcohol use: Yes    Alcohol/week: 1.0 standard drink of alcohol    Types: 1 Glasses of wine per week   Drug use: Never   Sexual activity: Yes    Birth control/protection: None  Other Topics Concern   Not on file  Social History Narrative   Lives at home with his wife.   No daily use of caffeine.   Right-handed.   Social Determinants of Health   Financial Resource Strain: Not on file  Food Insecurity: Not on file  Transportation Needs: Not on file  Physical Activity: Not on file  Stress: Not on file  Social Connections: Not on file  Intimate Partner Violence: Not on file      YijMarcial Pacas.D. Ph.D.  [facility] [id number] [id number]: [phone]: [phone]:  ColJanie MorningO [place] [id number] MorningO

## 2022-12-18 ENCOUNTER — Other Ambulatory Visit (HOSPITAL_COMMUNITY): Payer: Self-pay

## 2022-12-20 ENCOUNTER — Other Ambulatory Visit (HOSPITAL_COMMUNITY): Payer: Self-pay

## 2022-12-21 ENCOUNTER — Telehealth: Payer: Self-pay

## 2022-12-21 ENCOUNTER — Ambulatory Visit (HOSPITAL_BASED_OUTPATIENT_CLINIC_OR_DEPARTMENT_OTHER)
Admission: RE | Admit: 2022-12-21 | Discharge: 2022-12-21 | Disposition: A | Discharge status: Home / Self Care | Payer: Medicare Other | Source: Ambulatory Visit | Attending: Neurology | Admitting: Neurology

## 2022-12-21 ENCOUNTER — Ambulatory Visit (HOSPITAL_COMMUNITY)
Admission: RE | Admit: 2022-12-21 | Discharge: 2022-12-21 | Disposition: A | Discharge status: Home / Self Care | Payer: Medicare Other | Source: Ambulatory Visit | Attending: Neurology | Admitting: Neurology

## 2022-12-21 DIAGNOSIS — I77810 Thoracic aortic ectasia: Secondary | ICD-10-CM | POA: Insufficient documentation

## 2022-12-21 DIAGNOSIS — R42 Dizziness and giddiness: Secondary | ICD-10-CM

## 2022-12-21 DIAGNOSIS — R251 Tremor, unspecified: Secondary | ICD-10-CM | POA: Insufficient documentation

## 2022-12-21 DIAGNOSIS — R55 Syncope and collapse: Secondary | ICD-10-CM | POA: Diagnosis not present

## 2022-12-21 DIAGNOSIS — I422 Other hypertrophic cardiomyopathy: Secondary | ICD-10-CM | POA: Diagnosis not present

## 2022-12-21 DIAGNOSIS — I2510 Atherosclerotic heart disease of native coronary artery without angina pectoris: Secondary | ICD-10-CM | POA: Insufficient documentation

## 2022-12-21 DIAGNOSIS — I351 Nonrheumatic aortic (valve) insufficiency: Secondary | ICD-10-CM | POA: Insufficient documentation

## 2022-12-21 DIAGNOSIS — I10 Essential (primary) hypertension: Secondary | ICD-10-CM | POA: Insufficient documentation

## 2022-12-21 DIAGNOSIS — E785 Hyperlipidemia, unspecified: Secondary | ICD-10-CM | POA: Diagnosis not present

## 2022-12-21 LAB — ECHOCARDIOGRAM COMPLETE
Area-P 1/2: 3.77 cm2
Calc EF: 59.5 %
P 1/2 time: 590 msec
S' Lateral: 4.3 cm
Single Plane A2C EF: 56.1 %
Single Plane A4C EF: 62.2 %

## 2022-12-21 NOTE — Progress Notes (Signed)
Bilateral carotid ultrasound study completed.   Please see CV Procedures for preliminary results.  Lyrik Dockstader, RVT  3:27 PM 12/21/22

## 2022-12-21 NOTE — Progress Notes (Signed)
Echocardiogram 2D Echocardiogram has been performed.  Fidel Levy 12/21/2022, 2:52 PM

## 2022-12-21 NOTE — Telephone Encounter (Signed)
PA request received via CMM for Xhance 93MCG/ACT exhaler suspension  PA submitted to [employer] and is pending determination.   Key: Leta Jungling

## 2022-12-22 MED ORDER — TRIAMCINOLONE ACETONIDE 55 MCG/ACT NA AERO: INHALATION_SPRAY | NASAL | 5 refills | Status: DC

## 2022-12-22 NOTE — Telephone Encounter (Signed)
PA has been DENIED due to:   You do not meet the requirements of your plan. Your plan covers this drug when you have chronic rhinosinusitis with nasal polyps. Your request has been denied based on the information we have.

## 2022-12-22 NOTE — Telephone Encounter (Signed)
Ok. I will note this in his chart. Thank you

## 2022-12-22 NOTE — Addendum Note (Signed)
Addended by: Eloy End D on: 12/22/2022 05:03 PM   Modules accepted: Orders

## 2022-12-22 NOTE — Telephone Encounter (Signed)
Please change to Nasacort 2 sprays in each nostril once a day prn. Thank you

## 2022-12-26 ENCOUNTER — Encounter: Payer: Self-pay | Admitting: Neurology

## 2023-01-01 ENCOUNTER — Telehealth: Payer: Self-pay | Admitting: Cardiovascular Disease

## 2023-01-01 NOTE — Telephone Encounter (Signed)
Pt called stating since stopping amlodipine, he's noticed an increase in his BP. He reported systolic averaging around [id number], diastolic 123456. Pt denies headache, dizziness, or blurred vision. However, pt stated he continues to experience SOB that's worse when lay down at night. He denies swelling or significant weight increase.  Will forward to MD for recommendations

## 2023-01-01 NOTE — Telephone Encounter (Signed)
Patient is calling about his MyChart message he sent about his BP going back up.  He states the top number is in the 170-190's range and the bottom number is over 90's.  He didn't have actual BP reading to give me.  He states he has gone back up since stopping the amlodipine.

## 2023-01-04 MED ORDER — AMLODIPINE BESYLATE 5 MG PO TABS: 5.0000 mg | ORAL_TABLET | Freq: Every day | ORAL | 3 refills | Status: DC

## 2023-01-04 NOTE — Telephone Encounter (Signed)
Patient has been made aware. He will try the 5 mg and keep a log of his pressures and report back.   [name], MD  [facility] [phone] hours ago (6:01 PM)    Previously reported blood pressure was low when he was on amlodipine 10 Blood pressure running high without the amlodipine Would start amlodipine 5, monitor blood pressure If blood pressure continues to run high may need to go back to amlodipine 10 Thx TGollan

## 2023-01-08 ENCOUNTER — Encounter: Payer: Self-pay | Admitting: Family Medicine

## 2023-01-08 NOTE — Telephone Encounter (Signed)
Dale spoke to [facility] - DOB verified - advised of patient notation above. Aldona Bar stated they tried faxing over request - Fax number incorrect - for alternative due to Levalbuterol (Xopenex HFA) 45 mcg/act being on backorder.  Patient has been notified via myChart.  Forwarding message to provider for next step.

## 2023-01-08 NOTE — Telephone Encounter (Signed)
The testing that we did in our clinic was a spirometry which is one method to help [place] determine how you are breathing at the time of the test. On 12/07/2022 your spirometry did not show an obstructive pattern that we would normally see with asthma. We pair this with how you report that you are breathing and physical exam on that day of the clinic visit.   Can you please fins out if this patient wants to get his inhaler at a different pharmacy? We can send it to a different pharmacy or we can give him a written prescription that he can choose whichever pharmacy works best for him. The written prescription would allow him to shop around until he gets what he needs. Thank you

## 2023-01-09 ENCOUNTER — Other Ambulatory Visit: Payer: Self-pay

## 2023-01-09 MED ORDER — LEVALBUTEROL TARTRATE 45 MCG/ACT IN AERO: 2.0000 | INHALATION_SPRAY | Freq: Four times a day (QID) | RESPIRATORY_TRACT | 1 refills | Status: DC

## 2023-01-09 NOTE — Progress Notes (Signed)
Error. Medication already sent by another CMA.

## 2023-02-01 ENCOUNTER — Ambulatory Visit: Payer: No Typology Code available for payment source | Admitting: Neurology

## 2023-02-07 ENCOUNTER — Encounter: Payer: Self-pay | Admitting: Neurology

## 2023-02-07 ENCOUNTER — Ambulatory Visit (INDEPENDENT_AMBULATORY_CARE_PROVIDER_SITE_OTHER): Payer: Medicare Other | Admitting: Neurology

## 2023-02-07 VITALS — BP 117/64 | HR 63 | Ht 71.0 in | Wt 196.0 lb

## 2023-02-07 DIAGNOSIS — R0602 Shortness of breath: Secondary | ICD-10-CM

## 2023-02-07 DIAGNOSIS — R351 Nocturia: Secondary | ICD-10-CM

## 2023-02-07 DIAGNOSIS — R42 Dizziness and giddiness: Secondary | ICD-10-CM | POA: Diagnosis not present

## 2023-02-07 DIAGNOSIS — R55 Syncope and collapse: Secondary | ICD-10-CM | POA: Diagnosis not present

## 2023-02-07 NOTE — Progress Notes (Signed)
SLEEP MEDICINE CLINIC    Provider:  Melvyn Novas, MD  Primary Care Physician:  Irena Reichmann, DO [address] [place] [address]     Referring Provider: Irena Reichmann, Do [address] [place],  [place] [address]          Chief Complaint according to patient   Patient presents with:     New Patient (Initial Visit)     NEW SLEEP : Patient in room #2, Dr Terrace Arabia . Patient states it hard for him to go back to sleep once he is up to use the restroom 5 times (!) Patient snores, states he has been fatigued during the day.      HISTORY OF PRESENT ILLNESS:  Duane Warren is a 67 y.o. male patient who is seen upon referral on 02/07/2023 from Dr Terrace Arabia Chief concern according to patient :  NEW SLEEP : Patient in room #2, Dr Terrace Arabia . Patient states it hard for him to go back to sleep once he is up to use the restroom 5 times (!) Patient snores, states he has been fatigued during the day.   I have the pleasure of seeing Duane Warren 02/07/23 a right-handed male with a possible sleep disorder.     Medical history: Duane Warren has an interesting cardiac history and he is cardiomyopathy history which was back to 2017.  He has irregular heart beats, Dr. Graciela Husbands at the time implanted a loop recorder which never found an arrhythmia.   He does have a history of coronary artery disease, he had recurrent dizziness and fatigue and at times has felt as if he may faint but did not.  There is also documented history of snoring and the frequent nocturia may well be related to his underlying disorder and not be a primary urological condition.  Dr. Thomasena Edis also mentioned hyperaldosteronism, and benign prostatic hyperplasia.  He has tried fusosyn (?)  for the nocturia but he said he did not help but did not help very much.   He has been taking BuSpar to help initiate and maintain sleep which is usually prescribed for anxiety.  It does not have to be taken every day.  He  also reports a lot of leg cramps.  Over-the-counter supplements have not helped with this.  He has a history of gout but no problems with his joints at this time.   He was on spironolactone 1 tablet a day, which also may induce some nocturia.  I reviewed the medication list and the recent laboratory tests.  The patient is not anemic, his hematocrit was actually slightly elevated.  Lymphocytes were reduced, monocytes were at upper level of normal.  Hemoglobin A1c was normal 5.6.  Cholesterol total 117.  Glomerular filtration rate 56.  (I would consider this normal  and he does not have large muscle mass). EF 60%.    Duane Warren is a pleasant 67 year old gentleman with  Nocturia , frequent up to 5 times , some snoring, some dry mouth, no headaches, no Sleep walking, no ENT surgery, no Tonsillectomy, no TBI history , no wisdom tooth extraction.  CAD,  history of gout, hernia repair, knee arthroscopy.  history of primary aldosteronism ,on Aldactone. apical hypertrophic cardiomyopathy  Nonsustained VT Last visit March 2017 had CT coronary calcium score, Score of 726, Mildly dilated ascending aorta 41 mm,  syncope He presents today for follow-up of his Coronary artery disease, apical hypertrophy on echocardiogram   Last  seen by myself in clinic November 2022   Stress test 7/21: no ischemia Shortness of breath with stairs   In follow-up today reports he is recovering from COVID, started several weeks ago Still with cough, no other symptoms   Family medical /sleep history: No known family members  with OSA.    Social history:  Patient is retired from [employer]- [employer] now -   and lives in a household with spouse, with mother and sister , no pets.  The patient currently works in [employer]-  Tobacco use: none .  ETOH use ; yes. Wine with dinner or after - 5-6 / week Caffeine intake in form of Coffee( AM , 2/ week) Soda( 4/ week ) Tea ( /)  quit energy drinks Exercise: none .   Hobbies  : gardening.      Sleep habits are as follows: The patient's dinner time is between 6-9 PM. The patient goes to bed at 11-12 PM and asleep by 12 midnight , continues to sleep for 1.5 -2 hour intervals , wakes for 5 bathroom breaks, the first time at 1.30 AM.  He has a frequent urge to urinate.  Averages 5 hours of sleep- The preferred sleep position is laterally, with the support of 2 pillows. Flat bed, bedroom  is cool, quiet and dark. Dreams are reportedly frequent/vivid. " I get stuck in my dreams"  The patient wakes up before his alarm.  Often 1-2 hours before he needs to get up.  8.30  AM is the usual rise time. He reports not feeling refreshed or restored in AM, with symptoms such as dry mouth,  rare morning headaches, and residual fatigue.  Naps are taken infrequently- after lunch- lasting from 20 to 30 minutes and are more refreshing than nocturnal sleep.    Review of Systems: Out of a complete 14 system review, the patient complains of only the following symptoms, and all other reviewed systems are negative.:  Fatigue, sleepiness , snoring,   fragmented sleep, Insomnia, RLS?- cramping , Nocturia (!!!)   How likely are you to doze in the following situations: 0 = not likely, 1 = slight chance, 2 = moderate chance, 3 = high chance   Sitting and Reading? Watching Television? Sitting inactive in a public place (theater or meeting)? As a passenger in a car for an hour without a break? Lying down in the afternoon when circumstances permit? Sitting and talking to someone? Sitting quietly after lunch without alcohol? In a car, while stopped for a few minutes in traffic?   Total = 13/ 24 points   FSS endorsed at 48/ 63 points.   GDS :   4/ 15 , anxiety.  Some social anxiety, all is life  Social History   Socioeconomic History   Marital status: Married    Spouse name: Not on file   Number of children: 2   Years of education: some college   Highest education level: Not on file   Occupational History   Occupation: retired    Comment: [employer], Production designer, theatre/television/film of data center    Comment: pastor  Tobacco Use   Smoking status: Never   Smokeless tobacco: Never  Vaping Use   Vaping Use: Never used  Substance and Sexual Activity   Alcohol use: Yes    Alcohol/week: 1.0 standard drink of alcohol    Types: 1 Glasses of wine per week   Drug use: Never   Sexual activity: Yes    Birth  control/protection: None  Other Topics Concern   Not on file  Social History Narrative   Lives at home with his wife.   No daily use of caffeine.   Right-handed.   Social Determinants of Health   Financial Resource Strain: Not on file  Food Insecurity: Not on file  Transportation Needs: Not on file  Physical Activity: Not on file  Stress: Not on file  Social Connections: Not on file    Family History  Problem Relation Age of Onset   Hypertension Mother    Heart attack Father    Hypertension Father    Spina bifida Sister     Past Medical History:  Diagnosis Date   Agatston coronary artery calcium score greater than 400    a. 12/2015 Cardiac CT: Ca2+ score of 726 (91st %'ile).   Anxiety    Apical variant hypertrophic cardiomyopathy (HCC)    a. 01/2016 Echo: EF 55-60%, no rwma, mild AI, Ao root 39mm, mild MR, mod dil LA, mod TR, nl PASP; b. 12/2016 Cardiac MRI: EF 64%, sev apical hypertrophy up to 22mm in diastole. LGE in apical inf, lateral, and apical walls; c. 01/2018 Echo: EF 50-55%, gr2 DD. Mod-Sev apical hypertrophy. Mild AI. Mildly dil Ao root (3.8cm) and asc Ao (3.9cm). Sev dil LA. Nl RV fxn.   Dilated aortic root (HCC)    a. 01/2016 Ao root 39mm; b. 12/2016 Cardiac MRI: Mild aneurysmal dil of Asc Ao - 42mm; c. 01/2018 Echo: dil Ao root (3.8cm) and asc Ao (3.9cm).   Hyperaldosteronism (HCC)    Hypercholesteremia    Hypertension    Syncope    a. s/p MDT Linq.   Tremor     Past Surgical History:  Procedure Laterality Date   HERNIA REPAIR     KNEE SURGERY     unknown which  side   LOOP RECORDER INSERTION N/A 04/30/2017   Procedure: Loop Recorder Insertion;  Surgeon: [name], MD;  Location: [facility] INVASIVE CV LAB;  Service: Cardiovascular;  Laterality: N/A;   SKIN GRAFT       Current Outpatient Medications on File Prior to Visit  Medication Sig Dispense Refill   allopurinol (ZYLOPRIM) 100 MG tablet Take 100 mg by mouth daily.     amLODipine (NORVASC) 5 MG tablet Take 1 tablet (5 mg total) by mouth daily. 90 tablet 3   azelastine (OPTIVAR) 0.05 % ophthalmic solution Place 1 drop into both eyes 2 (two) times daily as needed (itchy/watery eyes). 6 mL 5   Azelastine HCl 0.15 % SOLN 2 sprays in each nostril twice a day as needed 30 mL 5   Cholecalciferol (VITAMIN D) 2000 units tablet Take 4,000 Units by mouth daily.      ezetimibe (ZETIA) 10 MG tablet Take 1 tablet (10 mg total) by mouth daily. 90 tablet 3   Fluticasone Propionate (XHANCE) 93 MCG/ACT EXHU Place 2 sprays into the nose 2 (two) times daily. 16 mL 2   levalbuterol (XOPENEX HFA) 45 MCG/ACT inhaler Inhale 2 puffs into the lungs 4 (four) times daily. 15 g 1   losartan (COZAAR) 50 MG tablet Take 1 tablet (50 mg total) by mouth daily. 90 tablet 3   propranolol (INDERAL) 20 MG tablet Take 1 tablet (20 mg total) by mouth 3 (three) times daily as needed. 30 tablet 6   rosuvastatin (CRESTOR) 5 MG tablet Take 1 tablet (5 mg total) by mouth daily. 90 tablet 3   sildenafil (VIAGRA) 100 MG tablet Take 50-100 mg  by mouth daily as needed.     spironolactone (ALDACTONE) 25 MG tablet Take 1 tablet (25 mg total) by mouth daily. 90 tablet 3   triamcinolone (NASACORT ALLERGY 24HR) 55 MCG/ACT AERO nasal inhaler Nasacort 2 sprays in each nostril once a day prn 16.9 mL 5   No current facility-administered medications on file prior to visit.    Allergies  Allergen Reactions   Molds & Smuts Other (See Comments)     DIAGNOSTIC DATA (LABS, IMAGING, TESTING) - I reviewed patient records, labs, notes, testing and imaging  myself where available.  Lab Results  Component Value Date   WBC 7.4 09/09/2018   HGB 16.8 09/09/2018   HCT 47.9 09/09/2018   MCV 84 09/09/2018   PLT 188 09/09/2018      Component Value Date/Time   NA 145 (H) 04/20/2020 1256   K 4.2 04/20/2020 1256   CL 106 04/20/2020 1256   CO2 21 04/20/2020 1256   GLUCOSE 102 (H) 04/20/2020 1256   GLUCOSE 98 07/17/2019 1305   BUN 17 04/20/2020 1256   CREATININE 1.34 (H) 04/20/2020 1256   CALCIUM 10.1 04/20/2020 1256   PROT 5.9 (L) 01/20/2019 0914   PROT 5.9 (L) 01/20/2019 0914   ALBUMIN 4.0 01/20/2019 0914   ALBUMIN 4.1 01/20/2019 0914   AST 20 01/20/2019 0914   AST 21 01/20/2019 0914   ALT 27 01/20/2019 0914   ALT 27 01/20/2019 0914   ALKPHOS 70 01/20/2019 0914   ALKPHOS 70 01/20/2019 0914   BILITOT 0.6 01/20/2019 0914   BILITOT 0.5 01/20/2019 0914   GFRNONAA 55 (L) 04/20/2020 1256   GFRAA 64 04/20/2020 1256   Lab Results  Component Value Date   CHOL 129 01/29/2020   HDL 50 01/29/2020   LDLCALC 56 01/29/2020   LDLDIRECT 60 01/29/2020   TRIG 135 01/29/2020   CHOLHDL 2.6 01/29/2020   Lab Results  Component Value Date   HGBA1C 5.8 (H) 09/09/2018   Lab Results  Component Value Date   VITAMINB12 487 09/12/2011   Lab Results  Component Value Date   TSH 2.700 09/09/2018     2017 , endocrinlogy : Adenoma of left adrenal gland I will evaluate the patient for other adrenal gland hormones by doing overnight dexamethasone suppression test, plasma metanephrines and DHEA sulfate levels. - Cortisol; Future - Basic Metabolic Panel; Future - DHEA-Sulfate (Sendout - [facility]); Future - Metanephrine, Fract Plasma (Sendout); Future  PHYSICAL EXAM:  Today's Vitals   02/07/23 1056  BP: 117/64  Pulse: 63  Weight: 196 lb (88.9 kg)  Height:  (1.803 m)   Body mass index is 27.34 kg/m.   Wt Readings from Last 3 Encounters:  02/07/23 196 lb (88.9 kg)  12/14/22 190 lb (86.2 kg)  12/07/22 190 lb 14.4 oz (86.6 kg)     Ht  Readings from Last 3 Encounters:  02/07/23  (1.803 m)  12/14/22  (1.803 m)  12/07/22  (1.803 m)      General: The patient is awake, alert and appears not in acute distress.  The patient is well groomed. Head: Normocephalic, atraumatic. Neck is supple. Mallampati 3,  neck circumference:16.5 inches . Nasal airflow is congested- seasonally .  Retrognathia is  seen.  Dental status: wore braces, retainers.  Cardiovascular:  Regular rate and cardiac rhythm by pulse,  without distended neck veins. Respiratory: Lungs are clear to auscultation.  Skin:  Without evidence of ankle edema, or rash. Trunk: The patient's posture is erect.  NEUROLOGIC EXAM: The patient is awake and alert, oriented to place and time.   Memory subjective described as intact.  Attention span & concentration ability appears normal.  Speech is fluent,  without  dysarthria, dysphonia or aphasia.  Mood and affect are appropriate.   Cranial nerves: no loss of smell or taste reported  Pupils are equal and briskly reactive to light. Funduscopic exam deferred.  Extraocular movements in vertical and horizontal planes were intact and without nystagmus. No Diplopia. Visual fields by finger perimetry are intact. Hearing was intact to soft voice and finger rubbing.    Facial sensation intact to fine touch.  Facial motor strength is symmetric and tongue and uvula move midline.  Neck ROM : rotation, tilt and flexion extension were normal for age and shoulder shrug was symmetrical.    Motor exam:  Symmetric bulk, tone and ROM.   Normal tone without cog -wheeling, symmetric grip strength .   Sensory:  Fine touch, pinprick and vibration were tested  and  normal.  Proprioception tested in the upper extremities was normal.   Coordination: Rapid alternating movements in the fingers/hands were of normal speed.  The Finger-to-nose maneuver was intact without evidence of ataxia, dysmetria or tremor.   Gait and  station: Patient could rise unassisted from a seated position, walked without assistive device.  Stance is of normal width/ base.  Toe and heel walk were deferred.  Deep tendon reflexes: in the upper and lower extremities are symmetric and intact.  Babinski response was deferred.    ASSESSMENT AND PLAN 67 y.o. year old male  here with:   )) had hyperaldosteronism,  aldosterone over producing - had adrenal glands surgically [facility] at [facility] , In 2015.     1)  Nocturia , deemed unrelated to BPH. He emptied the bladder well with each urination.  Could be related to medication, and can be a sign of untreated  apnea.   2) fatigue - excessive and excessive daytime sleepiness, also feeling often SOB. Screening for OSA /apnea.   3) at risk for Central apnea by CHF, arrhythmia.    I will start with a HST  to screen for sleep apnea , nocturia frequency prevents a good night study in the sleep lab.     I plan to follow up either personally or through our NP within 3-5 months.   I would like to thank Dr. Terrace Arabia  and Irena Reichmann, Do [address] [place],  [place] [address] for allowing me to meet with and to take care of this pleasant patient.   CC: I will share my notes with Dr Graciela Husbands .  After spending a total time of  45  minutes face to face and additional time for physical and neurologic examination, review of laboratory studies,  personal review of imaging studies, reports and results of other testing and review of referral information / records as far as provided in visit,   Electronically signed by: Melvyn Novas, MD 02/07/2023 11:32 AM  [facility] Sleep Board certified by The [employer] of Sleep Medicine and Diplomate of the [employer] of Sleep Medicine. Board certified In Neurology through the ABPN, Fellow of the [employer] of Neurology. Medical Director of [employer].

## 2023-03-01 ENCOUNTER — Telehealth: Payer: Self-pay | Admitting: Neurology

## 2023-03-01 NOTE — Telephone Encounter (Signed)
HST- Medicare/UHC GEHA no auth req   Patient is scheduled at [facility] For 03/28/23 at 1:30 pm.  Mailed packet to the patient.

## 2023-03-07 NOTE — Patient Instructions (Incomplete)
Allergic rhinitis Continue allergen avoidance measures directed toward mold Begin montelukast 10 mg once a day to control allergy symptoms. Patient cautioned that rarely some children/adults can experience behavioral changes after beginning montelukast. These side effects are rare, however, if you notice any change, notify the clinic and discontinue montelukast. Begin Xhance 2 sprays in each nostril twice a day for nasal congestion Continue azelastine 2 sprays in each nostril twice a day as needed for runny nose Consider saline nasal rinses as needed for nasal symptoms. Use this before any medicated nasal sprays for best result  Allergic conjunctivitis Continue Optivar eyedrops 1 drop in each eye twice a day as needed for red or itchy eyes  Shortness of breath Continue albuterol 2 puffs once every 4 hours as needed for shortness of breath or cough Begin montelukast 10 mg once a day to prevent cough or wheeze Call the clinic if this treatment plan is not working well for you  Follow up in 4 weeks or sooner if needed.  Control of Mold Allergen Mold and fungi can grow on a variety of surfaces provided certain temperature and moisture conditions exist.  Outdoor molds grow on plants, decaying vegetation and soil.  The major outdoor mold, Alternaria and Cladosporium, are found in very high numbers during hot and dry conditions.  Generally, a late Summer - Fall peak is seen for common outdoor fungal spores.  Rain will temporarily lower outdoor mold spore count, but counts rise rapidly when the rainy period ends.  The most important indoor molds are Aspergillus and Penicillium.  Dark, humid and poorly ventilated basements are ideal sites for mold growth.  The next most common sites of mold growth are the bathroom and the kitchen.  Outdoor Mold Control Use air conditioning and keep windows closed Avoid exposure to decaying vegetation. Avoid leaf raking. Avoid grain handling. Consider wearing a face  mask if working in moldy areas.  Indoor Mold Control Maintain humidity below 50%. Clean washable surfaces with 5% bleach solution. Remove sources e.g. Contaminated carpets.  

## 2023-03-07 NOTE — Progress Notes (Signed)
[address] [place] [address] Dept: [phone]  FOLLOW UP NOTE  Patient ID: Duane Warren, male    DOB: 1955-12-05  Age: 67 y.o. MRN: [id number] Date of Office Visit: 03/08/2023  Assessment  Chief Complaint: No chief complaint on file.  HPI Duane Warren is a 67 year old male who presents to the clinic for follow-up visit.  He was last seen in this clinic on 12/07/2022 by Thermon Leyland, FNP, for evaluation of allergic rhinitis, allergic conjunctivitis, and shortness of breath.   Drug Allergies:  Allergies  Allergen Reactions   Molds & Smuts Other (See Comments)    Physical Exam: There were no vitals taken for this visit.   Physical Exam  Diagnostics:    Assessment and Plan: No diagnosis found.  No orders of the defined types were placed in this encounter.   There are no Patient Instructions on file for this visit.  No follow-ups on file.    Thank you for the opportunity to care for this patient.  Please do not hesitate to contact me with questions.  Thermon Leyland, FNP [facility]

## 2023-03-08 ENCOUNTER — Ambulatory Visit (INDEPENDENT_AMBULATORY_CARE_PROVIDER_SITE_OTHER): Payer: No Typology Code available for payment source | Admitting: Family Medicine

## 2023-03-08 ENCOUNTER — Encounter: Payer: Self-pay | Admitting: Family Medicine

## 2023-03-08 ENCOUNTER — Other Ambulatory Visit: Payer: Self-pay

## 2023-03-08 VITALS — BP 132/80 | HR 65 | Temp 98.2°F | Resp 18 | Ht 71.0 in | Wt 190.3 lb

## 2023-03-08 DIAGNOSIS — R0602 Shortness of breath: Secondary | ICD-10-CM

## 2023-03-08 DIAGNOSIS — J3089 Other allergic rhinitis: Secondary | ICD-10-CM | POA: Diagnosis not present

## 2023-03-08 DIAGNOSIS — B349 Viral infection, unspecified: Secondary | ICD-10-CM | POA: Diagnosis not present

## 2023-03-08 DIAGNOSIS — H1013 Acute atopic conjunctivitis, bilateral: Secondary | ICD-10-CM

## 2023-03-08 MED ORDER — AZELASTINE HCL 0.05 % OP SOLN: 1.0000 [drp] | Freq: Two times a day (BID) | OPHTHALMIC | 5 refills | Status: DC | PRN

## 2023-03-08 MED ORDER — RYALTRIS 665-25 MCG/ACT NA SUSP: NASAL | 5 refills | Status: DC

## 2023-03-08 MED ORDER — LEVALBUTEROL TARTRATE 45 MCG/ACT IN AERO: 2.0000 | INHALATION_SPRAY | Freq: Four times a day (QID) | RESPIRATORY_TRACT | 1 refills | Status: DC

## 2023-03-28 ENCOUNTER — Telehealth: Payer: Self-pay | Admitting: Cardiovascular Disease

## 2023-03-28 ENCOUNTER — Ambulatory Visit (INDEPENDENT_AMBULATORY_CARE_PROVIDER_SITE_OTHER): Payer: Medicare Other | Admitting: Neurology

## 2023-03-28 DIAGNOSIS — R42 Dizziness and giddiness: Secondary | ICD-10-CM

## 2023-03-28 DIAGNOSIS — R351 Nocturia: Secondary | ICD-10-CM

## 2023-03-28 DIAGNOSIS — G4710 Hypersomnia, unspecified: Secondary | ICD-10-CM | POA: Diagnosis not present

## 2023-03-28 DIAGNOSIS — R0602 Shortness of breath: Secondary | ICD-10-CM

## 2023-03-28 NOTE — Telephone Encounter (Signed)
Patient states that he is returning and is requesting return call.

## 2023-03-28 NOTE — Telephone Encounter (Signed)
The patient called in with complaints of being fatigued, light headed, and having lack of energy for one month.  Appointment made for 6/24.    He is currently taking:  Amlodipine 5 mg once daily Losartan 50 mg once daily Spirolactone 25 mg in the morning Propanolol 20 mg one daily (prescribed as TID PRN)   Readings from the patient's message:  June 5th, 2024  193 lbs.      4:00pm  108/60  6:00pm   128/62            June 6th, 2024.                                            11:55am  138/72  3:00 pm Meds  8:00pm 111/65  85    June 7th, 2024  12:00pm 130/72  71  12:15pm Meds  2:15pm  116/70  56  8:37pm 112/67 69    June 8th, 2024  11:11am 127/70 81  3:35 pm Meds  05:44pm  114/67pm  59    June 9th, 2024  9:00am 118/68  12:15pm Meds  8:49pm 101/59  71         June 11th, 2024  12:00pm  128/69   68  12:00 pm Meds  9:30pm   110/60 67

## 2023-03-29 ENCOUNTER — Telehealth: Payer: Self-pay

## 2023-03-29 NOTE — Telephone Encounter (Signed)
*  Asthma/Allergy  PA request received for Ryaltris [id number]/ACT suspension  PA submitted to [employer] via CMM and is pending additional questions/determination  *previously on Flonase, Xhance, Azelastine  Key: [id number]

## 2023-03-29 NOTE — Telephone Encounter (Signed)
Pa was dneied pt has to fail generic dymits, fluticasone, mometasone advise to change in therapy

## 2023-03-29 NOTE — Progress Notes (Signed)
[facility] "Duane Warren" 67 year old male 1956/10/06   HOME SLEEP TEST REPORT ( by Watch PAT)   STUDY DATE:  03-29-2023   ORDERING CLINICIAN: Melvyn Novas, MD  REFERRING CLINICIAN: Irena Reichmann, DO   Primary neurologist Is Dr Duane Warren is a 67 y.o. male patient who is seen upon referral on 02/07/2023 from Dr Terrace Arabia    CLINICAL INFORMATION/HISTORY: 02-07-2023, first visit for new sleep patient referred by Dr Terrace Arabia .  Nocturia, SOB and hypersomnia. He reports up to 5 bathroom breaks each night,  snoring and daytime sleepiness and fatigue.  cardiomyopathy history which was back to 2017.  He has irregular heart beats, Dr. Graciela Husbands at the time implanted a loop recorder which never found an arrhythmia.   He does have a history of coronary artery disease, he had recurrent dizziness and fatigue and at times has felt as if he may faint but did not.  There is also documented history of snoring and the frequent nocturia may well be related to his underlying disorder and not be a primary urological condition.  Dr. Thomasena Edis also mentioned hyperaldosteronism, and benign prostatic hyperplasia.  He has tried fusosyn (?)  for the nocturia but he said he did not help but did not help very much.   He has been taking BuSpar to help initiate and maintain sleep which is usually prescribed for anxiety.  It does not have to be taken every day.  He also reports a lot of leg cramps.  Over-the-counter supplements have not helped with this.  He has a history of gout but no problems with his joints at this time.   He was on spironolactone 1 tablet a day, which also may induce some nocturia.    Epworth sleepiness score:  13/24. FSS at 48/ 63 points.    BMI: 27. 3 kg/m   Neck Circumference: 16.5"    FINDINGS:   Sleep Summary:   Total Recording Time (hours, min): 7 hours 52 minutes      Total Sleep Time (hours, min):     6 hours 54 minutes            Percent REM  (%): 14.7%                                      Respiratory Indices by CMS criteria:   Calculated pAHI (per hour):    3.7/h                         REM pAHI:     5/h                                            NREM pAHI:   3.5/h                           Supine AHI:   3.3/h versus a nonsupine AHI of 3.6/h.  Snoring reached a mean volume of 41 dB and was present for 60% of the total recording time.  Oxygen Saturation Statistics:   O2 Saturation Range (%): Between the nadir of 86 and a maximum of 96% with a mean saturation at 92%                                      O2 Saturation (minutes) <89%:     0.2 minutes      Pulse Rate Statistics:   Pulse Mean (bpm):   64 bpm              Pulse Range:   Between 40 and 102 bpm              IMPRESSION:  This HST did not confirm the presence of sleep apnea when CMS criteria were applied.  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  However with the current CMS guidelines I cannot offer him CPAP therapy.   RECOMMENDATION:    INTERPRETING PHYSICIAN:   Duane Novas, MD   Medical Director of [facility] at [facility].

## 2023-03-29 NOTE — Telephone Encounter (Signed)
Generic dymista please. Thank you

## 2023-03-30 MED ORDER — AZELASTINE-FLUTICASONE 137-50 MCG/ACT NA SUSP: 2.0000 | Freq: Every day | NASAL | 5 refills | Status: DC

## 2023-03-30 NOTE — Addendum Note (Signed)
Addended by: Berna Bue on: 03/30/2023 08:18 AM   Modules accepted: Orders

## 2023-03-30 NOTE — Telephone Encounter (Signed)
Sent in generic dymista

## 2023-04-02 ENCOUNTER — Encounter: Payer: Self-pay | Admitting: *Deleted

## 2023-04-02 MED ORDER — AMLODIPINE BESYLATE 5 MG PO TABS: 5.0000 mg | ORAL_TABLET | Freq: Every day | ORAL | 3 refills | Status: DC

## 2023-04-02 NOTE — Telephone Encounter (Signed)
Called and spoke with patient. Informed patient of the following recommendations from Dr. Mariah Milling.   Blood pressure numbers look quite good Sometimes little bit low in the evening Would make sure we are splitting up the medication Spironolactone and losartan in the morning amlodipine in the evening Propranolol as written as needed, does not need to be daily Thx TG    Patient verbalized understanding. Patient requesting a refill for Amlodipine 5 mg. Patient states that he only has 10 mg tablets and they are hard to cut. Prescription for Amlodipine sent to patient's preferred pharmacy.

## 2023-04-02 NOTE — Telephone Encounter (Signed)
Patient states he was returning call. Please advise ?

## 2023-04-05 ENCOUNTER — Ambulatory Visit (INDEPENDENT_AMBULATORY_CARE_PROVIDER_SITE_OTHER): Payer: Medicare Other | Admitting: Family Medicine

## 2023-04-05 ENCOUNTER — Other Ambulatory Visit: Payer: Self-pay

## 2023-04-05 ENCOUNTER — Encounter: Payer: Self-pay | Admitting: Family Medicine

## 2023-04-05 VITALS — BP 130/80 | HR 60 | Temp 97.9°F | Resp 20 | Wt 188.4 lb

## 2023-04-05 DIAGNOSIS — H1013 Acute atopic conjunctivitis, bilateral: Secondary | ICD-10-CM | POA: Diagnosis not present

## 2023-04-05 DIAGNOSIS — J3089 Other allergic rhinitis: Secondary | ICD-10-CM | POA: Diagnosis not present

## 2023-04-05 DIAGNOSIS — R0602 Shortness of breath: Secondary | ICD-10-CM | POA: Diagnosis not present

## 2023-04-05 MED ORDER — XHANCE 93 MCG/ACT NA EXHU: 2.0000 | INHALANT_SUSPENSION | Freq: Every day | NASAL | 5 refills | Status: DC

## 2023-04-05 NOTE — Patient Instructions (Addendum)
Allergic rhinitis Continue allergen avoidance measures directed toward mold Begin Xhance 2 sprays in each nostril once a day for nasal congestion. If not covered with your insurance, then continue Flonase 2 sprays in each nostril once a day for nasal congestion Consider saline nasal rinses as needed for nasal symptoms. Use this before any medicated nasal sprays for best result  Allergic conjunctivitis Continue Optivar eyedrops 1 drop in each eye twice a day as needed for red or itchy eyes.  Some other choices inculde over the counter eye drops such as Pataday one drop in each eye once a day as needed for red, itchy eyes OR Zaditor one drop in each eye twice a day as needed for red itchy eyes. Avoid eye drops that say red eye relief as they may contain medications that dry out your eyes.   Shortness of breath Continue levalbuterol 2 puffs once every 4 hours as needed for shortness of breath or cough  Call the clinic if this treatment plan is not working well for you  Follow up in 6 months or sooner if needed.  Control of Mold Allergen Mold and fungi can grow on a variety of surfaces provided certain temperature and moisture conditions exist.  Outdoor molds grow on plants, decaying vegetation and soil.  The major outdoor mold, Alternaria and Cladosporium, are found in very high numbers during hot and dry conditions.  Generally, a late Summer - Fall peak is seen for common outdoor fungal spores.  Rain will temporarily lower outdoor mold spore count, but counts rise rapidly when the rainy period ends.  The most important indoor molds are Aspergillus and Penicillium.  Dark, humid and poorly ventilated basements are ideal sites for mold growth.  The next most common sites of mold growth are the bathroom and the kitchen.  Outdoor [employer] Use air conditioning and keep windows closed Avoid exposure to decaying vegetation. Avoid leaf raking. Avoid grain handling. Consider wearing a face mask if  working in moldy areas.  Indoor Mold Control Maintain humidity below 50%. Clean washable surfaces with 5% bleach solution. Remove sources e.g. Contaminated carpets.

## 2023-04-05 NOTE — Addendum Note (Signed)
Addended by: Rolland Bimler D on: 04/05/2023 05:27 PM   Modules accepted: Orders

## 2023-04-05 NOTE — Progress Notes (Signed)
[address] [place] [address] Dept: [phone]  FOLLOW UP NOTE  Patient ID: Duane Warren, male    DOB: Jun 06, 1956  Age: 67 y.o. MRN: [id number] Date of Office Visit: 04/05/2023  Assessment  Chief Complaint: Follow-up (Having trouble sleeping)  HPI Duane Warren is a 67 year old male who presents to the clinic for follow-up visit.  He was last seen in this clinic on 03/08/2023 by Thermon Leyland, FNP, for evaluation of allergic rhinitis, allergic conjunctivitis, and shortness of breath with likely viral illness.  At today's visit, he reports that he continues to experience infrequent intermittent shortness of breath mostly with activity.  He reports that he frequently lifts heavy objects and frequently moves up and down stairs.  He denies wheeze or cough.  He continues albuterol once or twice a week with moderate relief of symptoms.  Allergic rhinitis is reported as moderately well-controlled with nasal congestion as the main symptom.  He reports this has decreased significantly since his last visit to this clinic.  He continues Flonase 2 sprays in each nostril once a day and is not currently using nasal saline rinses.  He is not using an antihistamine at this time.  He does report that he received the most benefit from Evart which he had tried at the previous visit.  Allergic conjunctivitis is reported as moderately well-controlled with occasional watery eyes.  He continues Optivar with moderate relief of symptoms.  Of note, he does report that he is recently having some trouble sleeping and feels tired frequently.  He will check in with his primary care provider for further evaluation of the symptoms.  His current medications are listed in the chart.   Drug Allergies:  Allergies  Allergen Reactions   Molds & Smuts Other (See Comments)    Physical Exam: BP 130/80   Pulse 60   Temp 97.9 F (36.6 C) (Temporal)   Resp 20   Wt 188 lb 6.4 oz (85.5 kg)   SpO2 97%    BMI 26.28 kg/m    Physical Exam Vitals reviewed.  Constitutional:      Appearance: Normal appearance.  HENT:     Head: Normocephalic and atraumatic.     Right Ear: Tympanic membrane normal.     Left Ear: Tympanic membrane normal.     Nose:     Comments: Bilateral nares edematous and pale with thick light yellow drainage noted.  Pharynx slightly erythematous with no exudate.  Ears normal.  Eyes normal.    Mouth/Throat:     Pharynx: Oropharynx is clear.  Eyes:     Conjunctiva/sclera: Conjunctivae normal.  Cardiovascular:     Rate and Rhythm: Normal rate and regular rhythm.     Heart sounds: Normal heart sounds. No murmur heard. Pulmonary:     Effort: Pulmonary effort is normal.     Breath sounds: Normal breath sounds.     Comments: Lungs clear to auscultation Musculoskeletal:        General: Normal range of motion.     Cervical back: Normal range of motion and neck supple.  Skin:    General: Skin is warm and dry.  Neurological:     Mental Status: He is alert and oriented to person, place, and time.  Psychiatric:        Mood and Affect: Mood normal.        Behavior: Behavior normal.        Thought Content: Thought content normal.  Judgment: Judgment normal.     Diagnostics: FVC 2.37 which is 62% of predicted value, FEV1 1.76 which is 60% of predicted value.  Spirometry indicates possible restriction.  This is consistent with previous spirometry readings.  Assessment and Plan: 1. Shortness of breath   2. Allergic conjunctivitis of both eyes   3. Perennial allergic rhinitis     No orders of the defined types were placed in this encounter.   Patient Instructions  Allergic rhinitis Continue allergen avoidance measures directed toward mold Begin Xhance 2 sprays in each nostril once a day for nasal congestion. If not covered with your insurance, then continue Flonase 2 sprays in each nostril once a day for nasal congestion Consider saline nasal rinses as needed for  nasal symptoms. Use this before any medicated nasal sprays for best result  Allergic conjunctivitis Continue Optivar eyedrops 1 drop in each eye twice a day as needed for red or itchy eyes.  Some other choices inculde over the counter eye drops such as Pataday one drop in each eye once a day as needed for red, itchy eyes OR Zaditor one drop in each eye twice a day as needed for red itchy eyes. Avoid eye drops that say red eye relief as they may contain medications that dry out your eyes.   Shortness of breath Continue levalbuterol 2 puffs once every 4 hours as needed for shortness of breath or cough  Call the clinic if this treatment plan is not working well for you  Follow up in 6 months or sooner if needed.   Return in about 6 months (around 10/05/2023), or if symptoms worsen or fail to improve.    Thank you for the opportunity to care for this patient.  Please do not hesitate to contact me with questions.  Thermon Leyland, FNP [facility]

## 2023-04-08 NOTE — Procedures (Signed)
[facility] [employer] "Duane Warren" 67 year old male 1956/10/06   HOME SLEEP TEST REPORT ( by [facility] PAT)   STUDY DATE:  03-29-2023   ORDERING CLINICIAN: Melvyn Novas, MD  REFERRING CLINICIAN: Irena Reichmann, DO   Primary neurologist Is Dr [name] is a 67 y.o. male patient who is seen upon referral on 02/07/2023 from Dr Terrace Arabia    CLINICAL INFORMATION/HISTORY: 02-07-2023, first visit for new sleep patient referred by Dr Terrace Arabia .  Nocturia, SOB and hypersomnia. He reports up to 5 bathroom breaks each night,  snoring and daytime sleepiness and fatigue.  cardiomyopathy history which was back to 2017.  He has irregular heart beats, Dr. Graciela Husbands at the time implanted a loop recorder which never found an arrhythmia.   He does have a history of coronary artery disease, he had recurrent dizziness and fatigue and at times has felt as if he may faint but did not.  There is also documented history of snoring and the frequent nocturia may well be related to his underlying disorder and not be a primary urological condition.  Dr. Thomasena Edis also mentioned hyperaldosteronism, and benign prostatic hyperplasia.  He has tried fusosyn (?)  for the nocturia but he said he did not help but did not help very much.   He has been taking BuSpar to help initiate and maintain sleep which is usually prescribed for anxiety.  It does not have to be taken every day.  He also reports a lot of leg cramps.  Over-the-counter supplements have not helped with this.  He has a history of gout but no problems with his joints at this time.   He was on spironolactone 1 tablet a day, which also may induce some nocturia.    Epworth sleepiness score:  13/24. FSS at 48/ 63 points.    BMI: 27. 3 kg/m   Neck Circumference: 16.5"    FINDINGS:   Sleep Summary:   Total Recording Time (hours, min): 7 hours 52 minutes      Total Sleep Time (hours, min):     6 hours 54 minutes            Percent REM  (%): 14.7%                                      Respiratory Indices by CMS criteria:   Calculated pAHI (per hour):    3.7/h                         REM pAHI:     5/h                                            NREM pAHI:   3.5/h                           Supine AHI:   3.3/h versus a nonsupine AHI of 3.6/h.  Snoring reached a mean volume of 41 dB and was present for 60% of the total recording time.  Oxygen Saturation Statistics:   O2 Saturation Range (%): Between the nadir of 86 and a maximum of 96% with a mean saturation at 92%                                      O2 Saturation (minutes) <89%:     0.2 minutes      Pulse Rate Statistics:   Pulse Mean (bpm):   64 bpm              Pulse Range:   Between 40 and 102 bpm              IMPRESSION:  This HST did not confirm the presence of sleep apnea when CMS criteria were applied.  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  However with the current CMS guidelines I cannot offer him CPAP therapy.   RECOMMENDATION:    INTERPRETING PHYSICIAN:   Melvyn Novas, MD   Medical Director of [facility] at [facility].

## 2023-04-08 NOTE — Progress Notes (Unsigned)
Cardiology Office Note  Date:  04/09/2023   ID:  Duane Warren, DOB 06/16/1956, MRN [id number]  PCP:  Duane Reichmann, DO   Chief Complaint  Patient presents with   Dizziness    Patient c/o weakness, fatigue, loss of appetite, dizziness/lightheaded & shortness of breath. Medications reviewed by the patient verbally.     HPI:  Duane Warren is a pleasant 67 year old gentleman with  CAD,  history of gout, hernia repair, knee arthroscopy.  history of primary aldosteronism ,on Aldactone. apical hypertrophic cardiomyopathy  Nonsustained VT Last visit March 2017 had CT coronary calcium score, Score of 726, Mildly dilated ascending aorta 41 mm,  syncope He presents today for follow-up of his Coronary artery disease, apical LVH on echocardiogram  Last seen by myself in clinic January 2024 Dr. Terrace Arabia from neurology started the propranolol as needed back in March 2022. For  Tremor Received phone call February 2024 for dizziness, lightheadedness, and feeling like he is going to pass out  recommendation made to hold the amlodipine  Next day reported having shortness of breath  Alcohol in March 2024 for elevated blood pressure, restarted on amlodipine 5  Phone calls early June 2024 with fatigue, lightheaded, lack of energy Blood pressure numbers well-controlled  Taking Amlodipine 5 mg once daily, evening Losartan 50 mg once daily, morning Spirolactone 25 mg in the morning Propanolol 20 mg one daily (prescribed as TID PRN)  Sedentary at baseline, poor sleep, no regular exercise Family in town this weekend, feels he was upside down, was missing doses of his blood pressure medication Waking at nighttime, difficulty getting back to sleep, waking up feeling tired  Recent lab work and sleep studies reviewed Echocardiogram March 2024, low normal action fraction No significant sleep apnea  Feels his blood pressure was relatively well-controlled on his current medication regiment  Stress  test 7/21: no ischemia Shortness of breath with stairs  No recent lipid panel available  EKG personally reviewed by myself on todays visit Normal sinus rhythm rate 65 bpm marked T wave abnormality precordial leads, PVC  Has loop in place, battery expired, did not want to take it out seen by Dr. Graciela Husbands July 2021, decision made that ICD was not needed  other past medical history  previouslyld that he might have depression and was started on a medication for depression. He did not feel well and was weaned off the medication. It was recommended that he start an alternate medication but he did not start this. He reports that since then, his dizziness has resolved.     he had extensive lab work in March 2014. Total cholesterol 189, HDL 56, LDL 104, lipoprotein (a) 61, CRP 3.4, hemoglobin A1c 5.5, normal LFTs, testosterone 387, Apo B 72 TSH 2.1 Reports that there is mild family history of cancer, hypertension him a CAD Blood pressure at home is typically in the 120-130 range over 60-70    PMH:   has a past medical history of Agatston coronary artery calcium score greater than 400, Anxiety, Apical variant hypertrophic cardiomyopathy (HCC), Dilated aortic root (HCC), Hyperaldosteronism (HCC), Hypercholesteremia, Hypertension, Syncope, and Tremor.  PSH:    Past Surgical History:  Procedure Laterality Date   HERNIA REPAIR     KNEE SURGERY     unknown which side   LOOP RECORDER INSERTION N/A 04/30/2017   Procedure: Loop Recorder Insertion;  Surgeon: [name], MD;  Location: [facility] INVASIVE CV LAB;  Service: Cardiovascular;  Laterality: N/A;   SKIN GRAFT  Current Outpatient Medications  Medication Sig Dispense Refill   allopurinol (ZYLOPRIM) 100 MG tablet Take 100 mg by mouth daily.     amLODipine (NORVASC) 5 MG tablet Take 1 tablet (5 mg total) by mouth daily. 90 tablet 3   azelastine (OPTIVAR) 0.05 % ophthalmic solution Place 1 drop into both eyes 2 (two) times daily as needed  (itchy/watery eyes). 6 mL 5   Azelastine-Fluticasone (DYMISTA) 137-50 MCG/ACT SUSP Place 2 sprays into both nostrils daily. 23 g 5   Cholecalciferol (VITAMIN D) 2000 units tablet Take 4,000 Units by mouth daily.      ezetimibe (ZETIA) 10 MG tablet Take 1 tablet (10 mg total) by mouth daily. 90 tablet 3   Fluticasone Propionate (XHANCE) 93 MCG/ACT EXHU Place 2 sprays into the nose daily. 16 mL 5   levalbuterol (XOPENEX HFA) 45 MCG/ACT inhaler Inhale 2 puffs into the lungs 4 (four) times daily. 15 g 1   losartan (COZAAR) 50 MG tablet Take 1 tablet (50 mg total) by mouth daily. 90 tablet 3   Olopatadine-Mometasone (RYALTRIS) 665-25 MCG/ACT SUSP 2 sprays in each nostril twice a day as needed for nasal symptoms. 29 g 5   propranolol (INDERAL) 20 MG tablet Take 1 tablet (20 mg total) by mouth 3 (three) times daily as needed. 30 tablet 6   rosuvastatin (CRESTOR) 5 MG tablet Take 1 tablet (5 mg total) by mouth daily. 90 tablet 3   sildenafil (VIAGRA) 100 MG tablet Take 50-100 mg by mouth daily as needed.     spironolactone (ALDACTONE) 25 MG tablet Take 1 tablet (25 mg total) by mouth daily. 90 tablet 3   No current facility-administered medications for this visit.    Allergies:   Molds & smuts   Social History:  The patient  reports that he has never smoked. He has never used smokeless tobacco. He reports current alcohol use of about 1.0 standard drink of alcohol per week. He reports that he does not use drugs.   Family History:   family history includes Heart attack in his father; Hypertension in his father and mother; Spina bifida in his sister.    Review of Systems: Review of Systems  Constitutional: Negative.   HENT: Negative.    Respiratory: Negative.    Cardiovascular: Negative.   Gastrointestinal: Negative.   Musculoskeletal: Negative.   Neurological: Negative.   Psychiatric/Behavioral: Negative.    All other systems reviewed and are negative.   PHYSICAL EXAM: VS:  BP (!) 140/90  (BP Location: Left Arm, Patient Position: Sitting, Cuff Size: Normal)   Pulse 63   Ht 5\' 11"  (1.803 m)   Wt 193 lb 8 oz (87.8 kg)   SpO2 98%   BMI 26.99 kg/m  , BMI Body mass index is 26.99 kg/m. Constitutional:  oriented to person, place, and time. No distress.  HENT:  Head: Grossly normal Eyes:  no discharge. No scleral icterus.  Neck: No JVD, no carotid bruits  Cardiovascular: Regular rate and rhythm, no murmurs appreciated Pulmonary/Chest: Clear to auscultation bilaterally, no wheezes or rails Abdominal: Soft.  no distension.  no tenderness.  Musculoskeletal: Normal range of motion Neurological:  normal muscle tone. Coordination normal. No atrophy Skin: Skin warm and dry Psychiatric: normal affect, pleasant  Recent Labs: No results found for requested labs within last 365 days.    Lipid Panel Lab Results  Component Value Date   CHOL 129 01/29/2020   HDL 50 01/29/2020   LDLCALC 56 01/29/2020   TRIG 135 01/29/2020  Wt Readings from Last 3 Encounters:  04/09/23 193 lb 8 oz (87.8 kg)  04/05/23 188 lb 6.4 oz (85.5 kg)  03/08/23 190 lb 4.8 oz (86.3 kg)     ASSESSMENT AND PLAN:  Essential hypertension Blood pressure elevated today but has missed several of his medications through the weekend with family in town Recommended he restart his regular regiment, he reports blood pressure was well-controlled  Fatigue/insomnia/low energy Reports he is scheduled to see endocrine this week Feels his adrenal gland may be " off" causing some symptoms  Atherosclerosis of both carotid arteries Low-grade disease less than 39% disease bilaterally in July 2021 Cholesterol at goal  Class 2 obesity due to excess calories without serious comorbidity with body mass index (BMI) of 35.0 to 35.9 in adult We have encouraged continued exercise, careful diet management in an effort to lose weight.  Coronary artery disease involving native coronary artery of native heart without angina  pectoris Elevated CT coronary calcium score, 700 Prior lipid panel numbers at goal We did discuss additional cardiac studies such as cardiac CTA if symptoms of just general malaise and poor energy persist  Mixed hyperlipidemia Continue Crestor 5 mg daily with Zetia 10 Goal LDL less than 70  Apical thickening  moderate to severe apical thickening  Seen by EP, no ICD indicated loop monitor previously placed with rare VT, battery now expired, declined explant Denies near syncope or syncope, no tachypalpitations   Total encounter time more than 40 minutes  Greater than 50% was spent in counseling and coordination of care with the patient    Orders Placed This Encounter  Procedures   EKG 12-Lead      Signed, Dossie Arbour, M.D., Ph.D. 04/09/2023  [facility] [place], [place] [phone]

## 2023-04-09 ENCOUNTER — Ambulatory Visit: Payer: Medicare Other | Attending: Cardiovascular Disease | Admitting: Cardiovascular Disease

## 2023-04-09 ENCOUNTER — Encounter: Payer: Self-pay | Admitting: Cardiovascular Disease

## 2023-04-09 VITALS — BP 140/90 | HR 63 | Ht 71.0 in | Wt 193.5 lb

## 2023-04-09 DIAGNOSIS — E785 Hyperlipidemia, unspecified: Secondary | ICD-10-CM | POA: Insufficient documentation

## 2023-04-09 DIAGNOSIS — I739 Peripheral vascular disease, unspecified: Secondary | ICD-10-CM | POA: Insufficient documentation

## 2023-04-09 DIAGNOSIS — R55 Syncope and collapse: Secondary | ICD-10-CM | POA: Diagnosis present

## 2023-04-09 DIAGNOSIS — R351 Nocturia: Secondary | ICD-10-CM | POA: Diagnosis present

## 2023-04-09 DIAGNOSIS — I2510 Atherosclerotic heart disease of native coronary artery without angina pectoris: Secondary | ICD-10-CM | POA: Insufficient documentation

## 2023-04-09 DIAGNOSIS — R0609 Other forms of dyspnea: Secondary | ICD-10-CM | POA: Insufficient documentation

## 2023-04-09 DIAGNOSIS — I6523 Occlusion and stenosis of bilateral carotid arteries: Secondary | ICD-10-CM | POA: Insufficient documentation

## 2023-04-09 DIAGNOSIS — R42 Dizziness and giddiness: Secondary | ICD-10-CM | POA: Insufficient documentation

## 2023-04-09 DIAGNOSIS — I10 Essential (primary) hypertension: Secondary | ICD-10-CM | POA: Diagnosis present

## 2023-04-09 DIAGNOSIS — I422 Other hypertrophic cardiomyopathy: Secondary | ICD-10-CM | POA: Insufficient documentation

## 2023-04-09 MED ORDER — SPIRONOLACTONE 25 MG PO TABS: 25.0000 mg | ORAL_TABLET | Freq: Every day | ORAL | 3 refills | Status: DC

## 2023-04-09 MED ORDER — ROSUVASTATIN CALCIUM 5 MG PO TABS: 5.0000 mg | ORAL_TABLET | Freq: Every day | ORAL | 3 refills | Status: DC

## 2023-04-09 MED ORDER — EZETIMIBE 10 MG PO TABS: 10.0000 mg | ORAL_TABLET | Freq: Every day | ORAL | 3 refills | Status: DC

## 2023-04-09 MED ORDER — LOSARTAN POTASSIUM 50 MG PO TABS: 50.0000 mg | ORAL_TABLET | Freq: Every day | ORAL | 3 refills | Status: DC

## 2023-04-09 NOTE — Patient Instructions (Addendum)
Referral to urology upstairs for prostate issues, BPH   Medication Instructions:  No changes  If you need a refill on your cardiac medications before your next appointment, please call your pharmacy.   Lab work: No new labs needed  Testing/Procedures: No new testing needed  Follow-Up: At [facility], you and your health needs are our priority.  As part of our continuing mission to provide you with exceptional heart care, we have created designated Provider Care Teams.  These Care Teams include your primary Cardiologist (physician) and Advanced Practice Providers (APPs -  Physician Assistants and Nurse Practitioners) who all work together to provide you with the care you need, when you need it.  You will need a follow up appointment in 12 months  Providers on your designated Care Team:   Nicolasa Ducking, NP Eula Listen, PA-C Cadence Fransico [place], [place]  COVID-19 Vaccine Information can be found at: [email or web address] For questions related to vaccine distribution or appointments, please email [email or web address] or call [phone].

## 2023-04-17 ENCOUNTER — Telehealth: Payer: Self-pay | Admitting: Cardiovascular Disease

## 2023-04-17 NOTE — Telephone Encounter (Signed)
Pt states the Endocrinologist stated he has low testosterone and pre-diabetic. Pt would like to discuss with Dr. Mariah Milling on how to go about this because the Endocrinologist told the pt if they increase the testosterone it could increase his chances of a stroke/heart attack by 40%. Please advise.

## 2023-04-23 NOTE — Telephone Encounter (Signed)
Spoke with patient and informed him of the provider's recommendations as follows:  "There may certainly be a cardiovascular risk of being on testosterone I think his cardiac risk factors are relatively well-managed and I am not expecting a heart attack with what we know Whether to start testosterone would be up to him and his endocrinologist whether the benefit outweighs the risk I think he would be safe to try if having symptoms of low testosterone Thx tGollan"  Patient understood with read back

## 2023-05-01 ENCOUNTER — Other Ambulatory Visit: Payer: Self-pay | Admitting: Cardiovascular Disease

## 2023-05-17 ENCOUNTER — Encounter: Payer: Self-pay | Admitting: Urology

## 2023-05-17 ENCOUNTER — Ambulatory Visit: Payer: Self-pay | Admitting: Urology

## 2023-06-22 ENCOUNTER — Other Ambulatory Visit: Payer: Self-pay | Admitting: Medical Oncology

## 2023-06-22 DIAGNOSIS — D649 Anemia, unspecified: Secondary | ICD-10-CM

## 2023-06-22 NOTE — Progress Notes (Unsigned)
[facility] [phone]   [phone]  CONSULT NOTE  REFERRING PHYSICIAN: Dr. Talmage Nap   REASON FOR CONSULTATION:  Thrombocytopenia and polycythemia   HPI Duane Warren is a 67 y.o. male with a past medical history significant for hypertension, ventricular tachycardia, coronary artery disease, HLD, allergic rhinitis, primary aldosteronism, adenoma of the left adrenal gland, sensorineural hearing loss, enlarged prostate, and hypogonadism is referred to the clinic for thrombocytopenia. However, he also had borderline elevated hematocrit on prior labs and was being considered for testosterone injections.  The patient recently followed up with his endocrinologist on 05/28/2023.  The patient has a history of adrenal mass diagnosed in 2013 that was consistent with adrenal adenoma. At his recent appointment with endocrinology, he reported fatigue, insomnia, polyuria, and dizziness.  He also reported 10 pound weight loss unintentional and loss of appetite.  He was also recently diagnosed with hypogonadism and cardiology cleared the patient for testosterone, although, they discussed with him it does increase his risk of heart attack.  The patient is not on any aspirin.  He takes Crestor and Zetia.  He had lab work performed on 05/22/2023 that showed normal white blood cell count at 7.3, normal hemoglobin at 16.9, he was noted to have a low platelet count at 125K.  His hematocrit was 50%.  In June 2024 his hematocrit was slightly elevated at 51.4%.  Endocrinology referred him regarding his thrombocytopenia per referral but office note mentions also getting opinion about testosterone.    Regarding the polycythemia, the patient denies any tobacco use, smoking, or asthma.  He states he had a sleep study which the results were negative for sleep apnea.  He denied any recent dehydration with nausea, vomiting, or diarrhea.  Although, he does, he does not drink a lot of water  and often feels dehydrated.  The patient does eat red meat but denies any excessive intake of red meat except for about 2 weeks ago.  The patient denies any headaches.  He does mention that he sometimes has blurry vision.  The patient may be due for an eye exam.  The patient denies any steroids, hormones, or extra vitamin use.  The patient did not start taking testosterone yet.  He denies any pruritus after hot shower.  He has a prescription for inhalers but does not use them.  Unclear of what the inhalers were prescribed for.  Denies any personal history of heart attacks or strokes.  He does have an enlarged heart and some coronary artery disease.  He denies any abnormal bleeding or bruising except he was straining to use the restroom today and there was a little bit of blood in the stool.  He believes he may be due for colonoscopy soon.  He is not sure which gastroenterology office he uses.  Overall, he reports some fatigue.  He denies any abnormal bleeding except for the episode of blood in the stool this morning.  He has some bruising from where he had phlebotomy last week.  Denies any gingival bleeding, epistaxis, hemoptysis, hematemesis, or hematuria.  Denies any recent fever.  He denies any known liver disease.  Denies any bleeding complications from surgery.  Denies any known vitamin deficiencies.  He rarely uses any NSAIDs.  He denies any family history of any hematologic disorders, bone marrow disorders, or cancers except he believes his grandfather had stomach cancer.  His mother has arthritis and hypertension.  His father passed away secondary to complications from a myocardial  infarction.  His sister is handicapped secondary to spina bifida.  The patient states that he is retired from working in [employer].  He is currently working as a Education officer, environmental.  He is married.  He has 2 children.  He is a never smoker.  He drinks 1 glass of wine per day.    HPI  Past Medical History:  Diagnosis Date    Agatston coronary artery calcium score greater than 400    a. 12/2015 Cardiac CT: Ca2+ score of 726 (91st %'ile).   Anxiety    Apical variant hypertrophic cardiomyopathy (HCC)    a. 01/2016 Echo: EF 55-60%, no rwma, mild AI, Ao root 39mm, mild MR, mod dil LA, mod TR, nl PASP; b. 12/2016 Cardiac MRI: EF 64%, sev apical hypertrophy up to 22mm in diastole. LGE in apical inf, lateral, and apical walls; c. 01/2018 Echo: EF 50-55%, gr2 DD. Mod-Sev apical hypertrophy. Mild AI. Mildly dil Ao root (3.8cm) and asc Ao (3.9cm). Sev dil LA. Nl RV fxn.   Dilated aortic root (HCC)    a. 01/2016 Ao root 39mm; b. 12/2016 Cardiac MRI: Mild aneurysmal dil of Asc Ao - 42mm; c. 01/2018 Echo: dil Ao root (3.8cm) and asc Ao (3.9cm).   Hyperaldosteronism (HCC)    Hypercholesteremia    Hypertension    Syncope    a. s/p MDT Linq.   Tremor     Past Surgical History:  Procedure Laterality Date   HERNIA REPAIR     KNEE SURGERY     unknown which side   LOOP RECORDER INSERTION N/A 04/30/2017   Procedure: Loop Recorder Insertion;  Surgeon: [name], MD;  Location: [facility] INVASIVE CV LAB;  Service: Cardiovascular;  Laterality: N/A;   SKIN GRAFT      Family History  Problem Relation Age of Onset   Hypertension Mother    Heart attack Father    Hypertension Father    Spina bifida Sister     Social History Social History   Tobacco Use   Smoking status: Never   Smokeless tobacco: Never  Vaping Use   Vaping status: Never Used  Substance Use Topics   Alcohol use: Yes    Alcohol/week: 1.0 standard drink of alcohol    Types: 1 Glasses of wine per week   Drug use: Never    Allergies  Allergen Reactions   Molds & Smuts Other (See Comments)    Current Outpatient Medications  Medication Sig Dispense Refill   allopurinol (ZYLOPRIM) 100 MG tablet Take 100 mg by mouth daily.     amLODipine (NORVASC) 5 MG tablet Take 1 tablet (5 mg total) by mouth daily. 90 tablet 3   azelastine (OPTIVAR) 0.05 % ophthalmic  solution Place 1 drop into both eyes 2 (two) times daily as needed (itchy/watery eyes). 6 mL 5   Azelastine-Fluticasone (DYMISTA) 137-50 MCG/ACT SUSP Place 2 sprays into both nostrils daily. (Patient taking differently: Place 2 sprays into both nostrils daily. PRN) 23 g 5   Cholecalciferol (VITAMIN D) 2000 units tablet Take 4,000 Units by mouth daily.      ezetimibe (ZETIA) 10 MG tablet Take 1 tablet (10 mg total) by mouth daily. 90 tablet 3   Fluticasone Propionate (XHANCE) 93 MCG/ACT EXHU Place 2 sprays into the nose daily. (Patient taking differently: Place 2 sprays into the nose daily. PRN) 16 mL 5   levalbuterol (XOPENEX HFA) 45 MCG/ACT inhaler Inhale 2 puffs into the lungs 4 (four) times daily. 15 g 1  losartan (COZAAR) 50 MG tablet Take 1 tablet (50 mg total) by mouth daily. 90 tablet 3   Olopatadine-Mometasone (RYALTRIS) 665-25 MCG/ACT SUSP 2 sprays in each nostril twice a day as needed for nasal symptoms. 29 g 5   propranolol (INDERAL) 20 MG tablet TAKE 1 TABLET BY MOUTH 3 TIMES DAILY AS NEEDED. (Patient taking differently: Take 20 mg by mouth daily.) 30 tablet 1   rosuvastatin (CRESTOR) 5 MG tablet Take 1 tablet (5 mg total) by mouth daily. 90 tablet 3   sildenafil (VIAGRA) 100 MG tablet Take 50-100 mg by mouth daily as needed.     spironolactone (ALDACTONE) 25 MG tablet Take 1 tablet (25 mg total) by mouth daily. 90 tablet 3   No current facility-administered medications for this visit.    REVIEW OF SYSTEMS:   Review of Systems  Constitutional: Positive for fatigue and appetite change. Negative for chills and fever.  HENT: Negative for mouth sores, nosebleeds, sore throat and trouble swallowing.   Eyes: Positive for occasional blurry vision. Negative for icterus.  Respiratory: Negative for cough, hemoptysis, shortness of breath and wheezing.   Cardiovascular: Negative for chest pain and leg swelling.  Gastrointestinal: Negative for abdominal pain, constipation, diarrhea, nausea and  vomiting.  Genitourinary: Positive for enlarged prostate. Negative for bladder incontinence,  dysuria, frequency and hematuria.   Musculoskeletal: Negative for back pain, gait problem, neck pain and neck stiffness.  Skin: Negative for itching and rash.  Neurological: Negative for dizziness, extremity weakness, gait problem, headaches, light-headedness and seizures.  Hematological: Negative for adenopathy. Does not bleed easily. Positive for bruising after phlebotomy.  Psychiatric/Behavioral: Negative for confusion, depression and sleep disturbance. The patient is not nervous/anxious.     PHYSICAL EXAMINATION:  Blood pressure 112/67, pulse 60, temperature (!) 97.5 F (36.4 C), temperature source Oral, resp. rate 13, weight 191 lb 11.2 oz (87 kg), SpO2 98%.  ECOG PERFORMANCE STATUS: 1  Physical Exam  Constitutional: Oriented to person, place, and time and well-developed, well-nourished, and in no distress.  HENT:  Head: Normocephalic and atraumatic.  Mouth/Throat: Oropharynx is clear and moist. No oropharyngeal exudate.  Eyes: Conjunctivae are normal. Right eye exhibits no discharge. Left eye exhibits no discharge. No scleral icterus.  Neck: Normal range of motion. Neck supple.  Cardiovascular: Normal rate, regular rhythm, normal heart sounds and intact distal pulses.   Pulmonary/Chest: Effort normal and breath sounds normal. No respiratory distress. No wheezes. No rales.  Abdominal: Soft. Bowel sounds are normal. Exhibits no distension and no mass. There is no tenderness.  Musculoskeletal: Normal range of motion. Exhibits no edema.  Lymphadenopathy:    No cervical adenopathy.  Neurological: Alert and oriented to person, place, and time. Exhibits normal muscle tone. Gait normal. Coordination normal.  Skin: Skin is warm and dry. No rash noted. Not diaphoretic. No erythema. No pallor.  Psychiatric: Mood, memory and judgment normal.  Vitals reviewed.  LABORATORY DATA: Lab Results   Component Value Date   WBC 7.3 06/25/2023   HGB 17.6 (H) 06/25/2023   HCT 52.1 (H) 06/25/2023   MCV 88.9 06/25/2023   PLT 154 06/25/2023      Chemistry      Component Value Date/Time   NA 141 06/25/2023 1316   NA 145 (H) 04/20/2020 1256   K 4.1 06/25/2023 1316   CL 107 06/25/2023 1316   CO2 30 06/25/2023 1316   BUN 17 06/25/2023 1316   BUN 17 04/20/2020 1256   CREATININE 1.20 06/25/2023 1316  Component Value Date/Time   CALCIUM 9.5 06/25/2023 1316   ALKPHOS 60 06/25/2023 1316   AST 22 06/25/2023 1316   ALT 20 06/25/2023 1316   BILITOT 0.8 06/25/2023 1316       RADIOGRAPHIC STUDIES: No results found.  ASSESSMENT: This is a very pleasant 67 year old African American male referred to the clinic for thrombocytopenia.   The patient was seen with Dr. Arbutus Ped today.  He had lab work ordered today for thrombocytopenia.  However his labs showed elevated hemoglobin at 17.6 and normal platelet count at 1 54K.  Therefore we have canceled some of his labs regarding the thrombocytopenia as he likely does not need extensive workup.  I have canceled the order for B12, folate, ANA, rheumatoid factor, and hepatitis.  Given his elevated hemoglobin we have added on JAK2 testing, ferritin, iron, and erythropoietin.    To avoid another IV stick, we will send off the JAK2 testing.  However the lab did let us know that typically they require to tubes of blood for this testing.  Therefore if they are unable to run this test, they will let us know and we will add repeat labs onto his next set of blood work.  Dr. Arbutus Ped and I explained that testosterone for his hypogonadism would likely worsen/elevate his hematocrit.  Therefore, we will check the results of his JAK2 testing to also see if the patient has myeloproliferative process.  Discussed that there is primary and secondary polycythemia.  If positive or JAK2, he will likely need phlebotomies.  If negative, then the patient understands  testosterone would increase his risk of strokes and heart attacks.  If he did decide to proceed with testosterone, he will likely need phlebotomies and monitoring of his lab work.  The patient will check with his cardiologist or PCP if he should be on any aspirin to reduce his risk of stroke and heart attack.  The patient is already on Crestor and Zetia.  We will see the patient back for follow-up visit in 1 month for evaluation and to review his lab work.  Of course if he has any concerning lab work, we will call the patient with further instructions prior to his follow-up in 1 month.  The patient was advised to avoid any excessive iron rich food or vitamin supplements.  The patient voices understanding of current disease status and treatment options and is in agreement with the current care plan.  All questions were answered. The patient knows to call the clinic with any problems, questions or concerns. We can certainly see the patient much sooner if necessary.  Thank you so much for allowing me to participate in the care of [employer] I will continue to follow up the patient with you and assist in his care.  Disclaimer: This note was dictated with voice recognition software. Similar sounding words can inadvertently be transcribed and may not be corrected upon review.   Caralee Morea L Zyona Pettaway June 25, 2023, 2:31 PM  ADDENDUM: Hematology/Oncology Attending: I had a face-to-face encounter with the patient today.  I reviewed his record, lab and recommended his care plan.  This is a very pleasant 67 years old African-American male with history of hypertension, ventricular tachycardia, coronary artery disease, primary aldosteronism, adenoma of the left adrenal gland, hearing loss, hypogonadism as well as dyslipidemia and allergic rhinitis.  The patient was seen recently by his endocrinologist for routine evaluation and management of the adrenal mass.  During his evaluation he was  noted to  have low platelets count of 1 25,000.  He was also noted to have persistently elevated hemoglobin and hematocrit.  He has not started any androgen therapy yet for the hypogonadism.  There was some concern about him starting androgen treatment and the patient was referred to [place] for evaluation of his thrombocytopenia but also to give a urine opinion about his polycythemia. When seen today the patient is feeling fine except for the fatigue. Will order several studies on the patient today including repeat CBC that showed normal total white blood count of 7.3 and normal platelets count of 154,000 but the patient has elevated hemoglobin of 17.6 and hematocrit 52.1%.  Comprehensive metabolic panel is unremarkable except for elevated blood glucose of 110.  He has normal LDH.  Iron study showed normal serum iron and iron saturation but ferritin level is still pending. Will order several other studies including erythropoietin and JAK2 mutation to rule out any underlying myeloproliferative disorder like polycythemia vera. We will see the patient back for follow-up visit in 1 months for evaluation and repeat CBC and also discussion of the pending lab results. He was advised to call immediately if he has any other concerning symptoms in the interval. The total time spent in the appointment was 60 minutes. Disclaimer: This note was dictated with voice recognition software. Similar sounding words can inadvertently be transcribed and may be missed upon review. Lajuana Matte, MD

## 2023-06-24 ENCOUNTER — Other Ambulatory Visit: Payer: Self-pay | Admitting: Physician Assistant

## 2023-06-24 DIAGNOSIS — R5383 Other fatigue: Secondary | ICD-10-CM

## 2023-06-24 DIAGNOSIS — D696 Thrombocytopenia, unspecified: Secondary | ICD-10-CM

## 2023-06-24 DIAGNOSIS — D649 Anemia, unspecified: Secondary | ICD-10-CM

## 2023-06-25 ENCOUNTER — Other Ambulatory Visit: Payer: Self-pay | Admitting: Physician Assistant

## 2023-06-25 ENCOUNTER — Inpatient Hospital Stay: Payer: Medicare Other | Admitting: *Deleted

## 2023-06-25 ENCOUNTER — Inpatient Hospital Stay: Payer: Medicare Other | Attending: Physician Assistant | Admitting: Physician Assistant

## 2023-06-25 VITALS — BP 112/67 | HR 60 | Temp 97.5°F | Resp 13 | Wt 191.7 lb

## 2023-06-25 DIAGNOSIS — Z79899 Other long term (current) drug therapy: Secondary | ICD-10-CM | POA: Diagnosis not present

## 2023-06-25 DIAGNOSIS — D751 Secondary polycythemia: Secondary | ICD-10-CM | POA: Diagnosis not present

## 2023-06-25 DIAGNOSIS — R718 Other abnormality of red blood cells: Secondary | ICD-10-CM

## 2023-06-25 DIAGNOSIS — R5383 Other fatigue: Secondary | ICD-10-CM

## 2023-06-25 DIAGNOSIS — D696 Thrombocytopenia, unspecified: Secondary | ICD-10-CM

## 2023-06-25 DIAGNOSIS — E291 Testicular hypofunction: Secondary | ICD-10-CM | POA: Insufficient documentation

## 2023-06-25 DIAGNOSIS — D649 Anemia, unspecified: Secondary | ICD-10-CM

## 2023-06-25 LAB — CMP (CANCER CENTER ONLY)
ALT: 20 U/L (ref 0–44)
AST: 22 U/L (ref 15–41)
Albumin: 4.2 g/dL (ref 3.5–5.0)
Alkaline Phosphatase: 60 U/L (ref 38–126)
Anion gap: 4 — ABNORMAL LOW (ref 5–15)
BUN: 17 mg/dL (ref 8–23)
CO2: 30 mmol/L (ref 22–32)
Calcium: 9.5 mg/dL (ref 8.9–10.3)
Chloride: 107 mmol/L (ref 98–111)
Creatinine: 1.2 mg/dL (ref 0.61–1.24)
GFR, Estimated: >60 mL/min (ref >60)
Glucose, Bld: 110 mg/dL — ABNORMAL HIGH (ref 70–99)
Potassium: 4.1 mmol/L (ref 3.5–5.1)
Sodium: 141 mmol/L (ref 135–145)
Total Bilirubin: 0.8 mg/dL (ref 0.3–1.2)
Total Protein: 6.5 g/dL (ref 6.5–8.1)

## 2023-06-25 LAB — FERRITIN: Ferritin: 47 ng/mL (ref 24–336)

## 2023-06-25 LAB — CBC WITH DIFFERENTIAL (CANCER CENTER ONLY)
Abs Immature Granulocytes: 0.02 10*3/uL (ref 0.00–0.07)
Basophils Absolute: 0 10*3/uL (ref 0.0–0.1)
Basophils Relative: 0 %
Eosinophils Absolute: 0.1 10*3/uL (ref 0.0–0.5)
Eosinophils Relative: 2 %
HCT: 52.1 % — ABNORMAL HIGH (ref 39.0–52.0)
Hemoglobin: 17.6 g/dL — ABNORMAL HIGH (ref 13.0–17.0)
Immature Granulocytes: 0 %
Lymphocytes Relative: 21 %
Lymphs Abs: 1.5 10*3/uL (ref 0.7–4.0)
MCH: 30 pg (ref 26.0–34.0)
MCHC: 33.8 g/dL (ref 30.0–36.0)
MCV: 88.9 fL (ref 80.0–100.0)
Monocytes Absolute: 0.6 10*3/uL (ref 0.1–1.0)
Monocytes Relative: 9 %
Neutro Abs: 4.9 10*3/uL (ref 1.7–7.7)
Neutrophils Relative %: 68 %
Platelet Count: 154 10*3/uL (ref 150–400)
RBC: 5.86 MIL/uL — ABNORMAL HIGH (ref 4.22–5.81)
RDW: 12.5 % (ref 11.5–15.5)
WBC Count: 7.3 10*3/uL (ref 4.0–10.5)
nRBC: 0 % (ref 0.0–0.2)

## 2023-06-25 LAB — IRON AND IRON BINDING CAPACITY (CC-WL,HP ONLY)
Iron: 92 ug/dL (ref 45–182)
Saturation Ratios: 31 % (ref 17.9–39.5)
TIBC: 293 ug/dL (ref 250–450)
UIBC: 201 ug/dL (ref 117–376)

## 2023-06-25 LAB — LACTATE DEHYDROGENASE: LDH: 140 U/L (ref 98–192)

## 2023-06-26 LAB — ERYTHROPOIETIN: Erythropoietin: 9.3 m[IU]/mL (ref 2.6–18.5)

## 2023-07-03 LAB — JAK2 (INCLUDING V617F AND EXON 12), MPL,& CALR W/RFL MPN PANEL (NGS)

## 2023-07-04 ENCOUNTER — Other Ambulatory Visit: Payer: Self-pay

## 2023-07-04 ENCOUNTER — Emergency Department (HOSPITAL_BASED_OUTPATIENT_CLINIC_OR_DEPARTMENT_OTHER)
Admission: EM | Admit: 2023-07-04 | Discharge: 2023-07-04 | Disposition: A | Discharge status: Home / Self Care | Payer: Medicare Other

## 2023-07-04 ENCOUNTER — Encounter (HOSPITAL_BASED_OUTPATIENT_CLINIC_OR_DEPARTMENT_OTHER): Payer: Self-pay | Admitting: Emergency Medicine

## 2023-07-04 ENCOUNTER — Emergency Department (HOSPITAL_BASED_OUTPATIENT_CLINIC_OR_DEPARTMENT_OTHER): Payer: Medicare Other | Admitting: Radiology

## 2023-07-04 DIAGNOSIS — M5442 Lumbago with sciatica, left side: Secondary | ICD-10-CM | POA: Diagnosis not present

## 2023-07-04 DIAGNOSIS — M5441 Lumbago with sciatica, right side: Secondary | ICD-10-CM | POA: Diagnosis not present

## 2023-07-04 DIAGNOSIS — M545 Low back pain, unspecified: Secondary | ICD-10-CM | POA: Diagnosis present

## 2023-07-04 LAB — CBC
HCT: 50.1 % (ref 39.0–52.0)
Hemoglobin: 17 g/dL (ref 13.0–17.0)
MCH: 30 pg (ref 26.0–34.0)
MCHC: 33.9 g/dL (ref 30.0–36.0)
MCV: 88.4 fL (ref 80.0–100.0)
Platelets: 142 10*3/uL — ABNORMAL LOW (ref 150–400)
RBC: 5.67 MIL/uL (ref 4.22–5.81)
RDW: 13.1 % (ref 11.5–15.5)
WBC: 10.4 10*3/uL (ref 4.0–10.5)
nRBC: 0 % (ref 0.0–0.2)

## 2023-07-04 LAB — URINALYSIS, ROUTINE W REFLEX MICROSCOPIC
Bilirubin Urine: NEGATIVE
Glucose, UA: NEGATIVE mg/dL
Hgb urine dipstick: NEGATIVE
Ketones, ur: NEGATIVE mg/dL
Leukocytes,Ua: NEGATIVE
Nitrite: NEGATIVE
Protein, ur: NEGATIVE mg/dL
Specific Gravity, Urine: 1.015 (ref 1.005–1.030)
pH: 5.5 (ref 5.0–8.0)

## 2023-07-04 LAB — BASIC METABOLIC PANEL WITH GFR
Anion gap: 6 (ref 5–15)
BUN: 20 mg/dL (ref 8–23)
CO2: 27 mmol/L (ref 22–32)
Calcium: 8.8 mg/dL — ABNORMAL LOW (ref 8.9–10.3)
Chloride: 106 mmol/L (ref 98–111)
Creatinine, Ser: 1.09 mg/dL (ref 0.61–1.24)
GFR, Estimated: >60 mL/min (ref >60)
Glucose, Bld: 113 mg/dL — ABNORMAL HIGH (ref 70–99)
Potassium: 4.6 mmol/L (ref 3.5–5.1)
Sodium: 139 mmol/L (ref 135–145)

## 2023-07-04 LAB — CBG MONITORING, ED: Glucose-Capillary: 121 mg/dL — ABNORMAL HIGH (ref 70–99)

## 2023-07-04 MED ORDER — SODIUM CHLORIDE 0.9 % IV BOLUS
1000.0000 mL | Freq: Once | INTRAVENOUS | Status: AC
  Administered 2023-07-04: 1000 mL via INTRAVENOUS

## 2023-07-04 MED ORDER — ACETAMINOPHEN 500 MG PO TABS
1000.0000 mg | ORAL_TABLET | Freq: Once | ORAL | Status: AC
  Administered 2023-07-04: 1000 mg via ORAL
  Filled 2023-07-04: qty 2

## 2023-07-04 NOTE — ED Triage Notes (Addendum)
Pt via pov from home with lower back and bilateral leg pain that goes all the way down to his calves. Pt denies any new exercise, states this is new to him, no hx of the same. Pt alert & oriented, nad noted.   Midway through triage, pt stated "I feel lightheaded." When questioned, pt states this has been ongoing intermittently today.

## 2023-07-04 NOTE — Discharge Instructions (Addendum)
Your workup today was reassuring.  Take Tylenol 650 mg every 6 hours as needed for pain.  Please follow-up with your primary care doctor for reevaluation and return to the emergency department for worsening symptoms.

## 2023-07-04 NOTE — ED Provider Notes (Signed)
[place] EMERGENCY DEPARTMENT AT [facility] Provider Note   CSN: [id number] Arrival date & time: 07/04/23  1050     History  Chief Complaint  Patient presents with   Back Pain   Leg Pain    Duane Warren is a 67 y.o. male.  67 year old male with past medical history of gout, hyperlipidemia, and arthritis presenting to the emergency department today with lower back pain.  The patient states that this began this morning.  He states that it was going down both legs.  He states that this came on when he was trying to squat down on the toilet.  He states that he felt like his legs were going to give way.  He states that it is feeling better now.  He denies any abdominal pain.  Denies any weakness in his legs currently.  He states that he was feeling lightheaded initially with the pain but states that this is feeling better now.  He denies any bowel or bladder dysfunction or saddle anesthesia with this.  Denies any history of fevers.  He is not on any blood thinners.  He came to the ER today for further evaluation regarding his.   Back Pain Associated symptoms: leg pain   Leg Pain Associated symptoms: back pain        Home Medications Prior to Admission medications   Medication Sig Start Date End Date Taking? Authorizing Provider  MEDROL 4 MG TBPK tablet Take 4 mg by mouth as directed. 07/02/23  Yes [provider]  testosterone cypionate (DEPOTESTOSTERONE CYPIONATE) 200 MG/ML injection Inject 200 mg into the muscle every 14 (fourteen) days. 05/15/23  Yes [provider]  allopurinol (ZYLOPRIM) 100 MG tablet Take 100 mg by mouth daily.    [provider]  amLODipine (NORVASC) 5 MG tablet Take 1 tablet (5 mg total) by mouth daily. 04/02/23 03/27/24  Antonieta Iba, MD  azelastine (OPTIVAR) 0.05 % ophthalmic solution Place 1 drop into both eyes 2 (two) times daily as needed (itchy/watery eyes). 03/08/23   Hetty Blend, FNP   Azelastine-Fluticasone (DYMISTA) 137-50 MCG/ACT SUSP Place 2 sprays into both nostrils daily. Patient taking differently: Place 2 sprays into both nostrils daily. PRN 03/30/23   Hetty Blend, FNP  Cholecalciferol (VITAMIN D) 2000 units tablet Take 4,000 Units by mouth daily.     [provider]  ezetimibe (ZETIA) 10 MG tablet Take 1 tablet (10 mg total) by mouth daily. 04/09/23   Antonieta Iba, MD  Fluticasone Propionate Timmothy Sours) 93 MCG/ACT EXHU Place 2 sprays into the nose daily. Patient taking differently: Place 2 sprays into the nose daily. PRN 04/05/23   Hetty Blend, FNP  levalbuterol [facility] HFA) 45 MCG/ACT inhaler Inhale 2 puffs into the lungs 4 (four) times daily. 03/08/23   Hetty Blend, FNP  losartan (COZAAR) 50 MG tablet Take 1 tablet (50 mg total) by mouth daily. 04/09/23   Antonieta Iba, MD  meloxicam (MOBIC) 15 MG tablet Take 15 mg by mouth daily.    [provider]  Olopatadine-Mometasone [facility]) [phone] MCG/ACT SUSP 2 sprays in each nostril twice a day as needed for nasal symptoms. 03/08/23   Hetty Blend, FNP  propranolol (INDERAL) 20 MG tablet TAKE 1 TABLET BY MOUTH 3 TIMES DAILY AS NEEDED. Patient taking differently: Take 20 mg by mouth daily. 05/01/23   Antonieta Iba, MD  rosuvastatin (CRESTOR) 5 MG tablet Take 1 tablet (5 mg total) by mouth daily. 04/09/23  Antonieta Iba, MD  sildenafil (VIAGRA) 100 MG tablet Take 50-100 mg by mouth daily as needed. 08/19/22   [provider]  spironolactone (ALDACTONE) 25 MG tablet Take 1 tablet (25 mg total) by mouth daily. 04/09/23   Antonieta Iba, MD      Allergies    Molds & smuts    Review of Systems   Review of Systems  Musculoskeletal:  Positive for back pain.  All other systems reviewed and are negative.   Physical Exam Updated Vital Signs BP 125/72 (BP Location: Left Arm)   Pulse 69   Temp 98 F (36.7 C) (Oral)   Resp 20   Ht 5\' 11"  (1.803 m)   Wt 86.2 kg   SpO2 96%   BMI  26.50 kg/m  Physical Exam Vitals and nursing note reviewed.   Gen: NAD Eyes: PERRL, EOMI HEENT: no oropharyngeal swelling Neck: trachea midline Resp: clear to auscultation bilaterally Card: RRR, no murmurs, rubs, or gallops Abd: nontender, nondistended Extremities: no calf tenderness, no edema MSK: The patient is tender over the mid and lower lumbar spine with no step-offs or deformities noted Vascular: 2+ radial pulses bilaterally, 2+ DP pulses bilaterally Neuro: Equal strength and sensation throughout the bilateral lower extremities with normal patellar and Achilles reflexes bilaterally Skin: no rashes Psyc: acting appropriately   ED Results / Procedures / Treatments   Labs (all labs ordered are listed, but only abnormal results are displayed) Labs Reviewed  BASIC METABOLIC PANEL - Abnormal; Notable for the following components:      Result Value   Glucose, Bld 113 (*)    Calcium 8.8 (*)    All other components within normal limits  CBC - Abnormal; Notable for the following components:   Platelets 142 (*)    All other components within normal limits  CBG MONITORING, ED - Abnormal; Notable for the following components:   Glucose-Capillary 121 (*)    All other components within normal limits  URINALYSIS, ROUTINE W REFLEX MICROSCOPIC    EKG EKG Interpretation Date/Time:  Wednesday July 04 2023 11:30:28 EDT Ventricular Rate:  62 PR Interval:  101 QRS Duration:  92 QT Interval:  463 QTC Calculation: 471 R Axis:   45  Text Interpretation: Sinus rhythm Short PR interval Probable LVH with secondary repol abnrm Anterior Q waves, possibly due to LVH Repol abnrm, global ischemia, diffuse leads Confirmed by Beckey Downing [phone]) on 07/04/2023 11:43:36 AM  Radiology DG Lumbar Spine 2-3 Views  Result Date: 07/04/2023 CLINICAL DATA:  New onset lower back pain.  No known injury. EXAM: LUMBAR SPINE - 3 VIEW COMPARISON:  None Available. FINDINGS: There is no evidence of lumbar  spine fracture. Grade 1 retrolisthesis at L5-S1. Intervertebral disc space narrowing at L5-S1. Multilevel facet arthropathy spanning L3-S1. IMPRESSION: 1. No acute fracture or traumatic listhesis. 2. Multilevel degenerative changes of the lumbar spine, most pronounced at L5-S1. Electronically Signed   By: Agustin Cree M.D.   On: 07/04/2023 15:15    Procedures Procedures    Medications Ordered in ED Medications  sodium chloride 0.9 % bolus 1,000 mL (0 mLs Intravenous Stopped 07/04/23 1250)  acetaminophen (TYLENOL) tablet 1,000 mg (1,000 mg Oral Given 07/04/23 1217)    ED Course/ Medical Decision Making/ A&P                                 Medical Decision Making 67 year old male with past medical  history of hyperlipidemia, gout, and arthritis presenting to the emergency department today with back pain that has since resolved.  The patient's EKG interpreted by me shows a sinus rhythm rate of 62 with normal axis, normal intervals, and diffuse T wave inversions in the inferior and lateral leads.  This does appear similar morphology to his previous EKG.  I will obtain basic labs here to evaluate for anemia given the lightheadedness.  Will keep him on the monitor to evaluate for arrhythmias.  Obtain an x-ray of his lumbar spine to evaluate for compression fracture or bony abnormality such as lytic or blastic lesions.  I will give him Tylenol for pain.  He does not have any red flag symptoms for cauda equina syndrome or cord compressing lesion at this time.  He is neurovascularly intact and suspicion for aortic dissection/AAA is low at this time.  I will reevaluate for ultimate disposition.  The patient's EKG showed diffuse T wave inversions but these do appear similar to previous EKG.  The patient had reassuring labs and x-ray shows degenerative changes but no acute changes.  I think that he is stable for discharge.  Amount and/or Complexity of Data Reviewed Labs: ordered. Radiology:  ordered.  Risk OTC drugs.           Final Clinical Impression(s) / ED Diagnoses Final diagnoses:  Low back pain with bilateral sciatica, unspecified back pain laterality, unspecified chronicity    Rx / DC Orders ED Discharge Orders     None         Durwin Glaze, MD 07/04/23 1536

## 2023-07-04 NOTE — ED Notes (Signed)
Reviewed AVS/discharge instruction with patient. Time allotted for and all questions answered. Patient is agreeable for d/c and escorted to ed exit by staff.  

## 2023-07-04 NOTE — ED Notes (Signed)
Ambulatory from restroom

## 2023-07-06 ENCOUNTER — Telehealth: Payer: Self-pay | Admitting: Physician Assistant

## 2023-07-06 DIAGNOSIS — D751 Secondary polycythemia: Secondary | ICD-10-CM

## 2023-07-06 NOTE — Telephone Encounter (Signed)
I called the patient to review his JAK2 results. His Jak 2 was negative but he had other genetic alterations of potential and unknown significance. Therefore, Dr. Arbutus Ped recommends that we follow him in the clinic. If he ever has elevated hematocrit, we may consider him for phlebotomy. I talked to him and his wife about this. We will arrange follow up next month for follow up and repeat blood work.

## 2023-07-10 ENCOUNTER — Ambulatory Visit (INDEPENDENT_AMBULATORY_CARE_PROVIDER_SITE_OTHER): Payer: Medicare Other | Admitting: Neurology

## 2023-07-10 ENCOUNTER — Encounter: Payer: Self-pay | Admitting: Neurology

## 2023-07-10 VITALS — Ht 71.0 in | Wt 190.0 lb

## 2023-07-10 DIAGNOSIS — R251 Tremor, unspecified: Secondary | ICD-10-CM

## 2023-07-10 DIAGNOSIS — M47812 Spondylosis without myelopathy or radiculopathy, cervical region: Secondary | ICD-10-CM

## 2023-07-10 MED ORDER — PROPRANOLOL HCL 20 MG PO TABS: 20.0000 mg | ORAL_TABLET | Freq: Three times a day (TID) | ORAL | 3 refills | Status: DC | PRN

## 2023-07-10 NOTE — Progress Notes (Signed)
Chief Complaint  Patient presents with   Follow-up    Rm14, alone, off balance and dizziness:been doing well recently. Orthostatic bp completed.       ASSESSMENT AND PLAN  Duane Warren is a 67 y.o. male   Essential tremor  MRI of the brain showed no significant abnormality  Inderal 20 mg as needed was helpful  Presyncope episode, dizziness with sudden positional change,  His reported dizziness could due to presyncope, advised him increase water intake  No orthostatic blood pressure showed,  Ultrasound of carotid artery showed no significant large vessel disease  Seen by cardiologist  Echocardiogram showed moderate concentric left ventricular hypertrophy, previous loop recorder expired, coronary artery disease, continue to optimize risk factor control  DIAGNOSTIC DATA (LABS, IMAGING, TESTING) - I reviewed patient records, labs, notes, testing and imaging myself where available.  MRI cervical in Sept 2018. 1. Mid and lower cervical disc degeneration as described. 2. C5-6 moderate left foraminal stenosis. 3. C6-7 right paracentral disc protrusion with right C7 impingement and mild right ventral cord flattening. 4. 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  MRI brain in July 2021 1. No acute intracranial abnormality. 2. Small, old left cerebellar infarct.   Laboratory evaluation in July 2021: LDL 56, mild elevated creatinine 1.34, GFR 55   HISTORICAL  Duane Warren,is a 67 year old male, seen in request by his primary care doctor collins, Annabelle Harman for evaluation of tremor, initial evaluation was January 13, 2021, he is accompanied by his wife at today's visit.  I reviewed and summarized the referring note.PMHX. HTN HLD S/p Loop record in 2018 for dizziness, no abnormality found.  He has a known history of right cervical spondylosis, present with right cervical radiculopathy in 2018, MRI of cervical spine then showed multilevel degenerative changes,  most noticeable at C6-7 level, right paracentral disc protrusion with right C7 impingement, mild right ventral cord flattening, with treatment, his right arm radicular pain has much improved, but still has intermittent right more than left hand muscle cramping, he denies gait abnormality, has urinary frequency, but no incontinence, no persistent upper extremity sensory loss or weakness  Over the past few years, he also developed gradual onset mild bilateral hands tremor, most noticeable at right dominant hand, when he working on small objects, more of a nuisance, there was no limitation in his activity.  UPDATE Dec 14 2022: He continues to work as a Education officer, environmental at [employer], had 1 presyncope episode a month ago, around 11:30 AM, while in the pulpit, standing for 25 minutes delivering message, he suddenly felt lightheaded, vision turned to dark, he did not loss consciousness, able to quickly finish his sentence, sat down, symptoms gradually resolved after drinking some water, eating his breakfast bar, he missed his breakfast that morning.  He was seen by cardiologist in the past for slow heart rate, today heart rate was 56, frequent PVCs, previously had loop recorder, lost its function in 2023,  Recent laboratory evaluation by [facility] medical, A1c was mildly elevated 6.1,  Since the event, he was seen by his cardiologist Dr. Mariah Milling on October 31, 2022, made adjustment on his blood pressure medication, stop amlodipine  atherosclerotic disease of bilateral carotid artery, low-grade stenosis, coronary artery disease involving native vessel, elevated CT coronary calcium score, apical thickening, moderate to severe by echocardiogram in 2021,  Still noticed bilateral hand action tremor, no significant worsening,  UPDATE Sept 24 2024: He is overall doing well, but there was 2 episode, he  get up quickly from seated position, felt mildly unsteady,  Recent laboratory evaluation showed mild elevation of  hemoglobin 17, was seen by hematologist,  Propranolol as needed did help his hand tremor  REVIEW OF SYSTEMS: Full 14 system review of systems performed and notable only for as above All other review of systems were negative.  PHYSICAL EXAM   Vitals:   07/10/23 1034  Weight: 190 lb 0.6 oz (86.2 kg)  Height: 5\' 11"  (1.803 m)    07/10/2023 10:34 AM 07/10/2023 10:35 AM   Orthostatic BP 147/78 146/89  BP Location Right Arm Right Arm  Patient Position Sitting Standing  Cuff Size Normal Normal  Orthostatic Pulse 64 63     Body mass index is 26.5 kg/m.  PHYSICAL EXAMNIATION:  Gen: NAD, conversant, well nourised, well groomed                     Cardiovascular: Irregular rate rhythm, no peripheral edema, warm, nontender. Eyes: Conjunctivae clear without exudates or hemorrhage Neck: Supple, no carotid bruits. Pulmonary: Clear to auscultation bilaterally   NEUROLOGICAL EXAM:  MENTAL STATUS: Speech/cognition: Awake, alert, oriented to history taking and care conversation   CRANIAL NERVES: CN II: Visual fields are full to confrontation. Pupils are round equal and briskly reactive to light. CN III, IV, VI: extraocular movement are normal. No ptosis. CN V: Facial sensation is intact to light touch CN VII: Face is symmetric with normal eye closure  CN VIII: Hearing is normal to causal conversation. CN IX, X: Phonation is normal. CN XI: Head turning and shoulder shrug are intact  MOTOR: He has mild bilateral hand posturing tremor, no weakness, no rigidity no bradykinesia, Mild difficulty drawing spiral circle, left worse than right  REFLEXES: Reflexes are 2+ and symmetric at the biceps, triceps, 3/3 knees, and ankles. Plantar responses are flexor.   SENSORY: Intact to light touch, pinprick and vibratory sensation are intact in fingers and toes.  COORDINATION: There is no trunk or limb dysmetria noted.  GAIT/STANCE: He can get up from seated position arm crossed,  steady  ALLERGIES: Allergies  Allergen Reactions   Molds & Smuts Other (See Comments)    HOME MEDICATIONS: Current Outpatient Medications  Medication Sig Dispense Refill   allopurinol (ZYLOPRIM) 100 MG tablet Take 100 mg by mouth daily.     amLODipine (NORVASC) 5 MG tablet Take 1 tablet (5 mg total) by mouth daily. 90 tablet 3   azelastine (OPTIVAR) 0.05 % ophthalmic solution Place 1 drop into both eyes 2 (two) times daily as needed (itchy/watery eyes). 6 mL 5   Azelastine-Fluticasone (DYMISTA) 137-50 MCG/ACT SUSP Place 2 sprays into both nostrils daily. (Patient taking differently: Place 2 sprays into both nostrils daily. PRN) 23 g 5   Cholecalciferol (VITAMIN D) 2000 units tablet Take 4,000 Units by mouth daily.      ezetimibe (ZETIA) 10 MG tablet Take 1 tablet (10 mg total) by mouth daily. 90 tablet 3   Fluticasone Propionate (XHANCE) 93 MCG/ACT EXHU Place 2 sprays into the nose daily. (Patient taking differently: Place 2 sprays into the nose daily. PRN) 16 mL 5   levalbuterol (XOPENEX HFA) 45 MCG/ACT inhaler Inhale 2 puffs into the lungs 4 (four) times daily. 15 g 1   losartan (COZAAR) 50 MG tablet Take 1 tablet (50 mg total) by mouth daily. 90 tablet 3   MEDROL 4 MG TBPK tablet Take 4 mg by mouth as directed.     meloxicam (MOBIC) 15 MG tablet  Take 15 mg by mouth daily.     Olopatadine-Mometasone (RYALTRIS) X543819 MCG/ACT SUSP 2 sprays in each nostril twice a day as needed for nasal symptoms. 29 g 5   propranolol (INDERAL) 20 MG tablet TAKE 1 TABLET BY MOUTH 3 TIMES DAILY AS NEEDED. (Patient taking differently: Take 20 mg by mouth daily.) 30 tablet 1   rosuvastatin (CRESTOR) 5 MG tablet Take 1 tablet (5 mg total) by mouth daily. 90 tablet 3   sildenafil (VIAGRA) 100 MG tablet Take 50-100 mg by mouth daily as needed.     spironolactone (ALDACTONE) 25 MG tablet Take 1 tablet (25 mg total) by mouth daily. 90 tablet 3   testosterone cypionate (DEPOTESTOSTERONE CYPIONATE) 200 MG/ML  injection Inject 200 mg into the muscle every 14 (fourteen) days.     No current facility-administered medications for this visit.    PAST MEDICAL HISTORY: Past Medical History:  Diagnosis Date   Agatston coronary artery calcium score greater than 400    a. 12/2015 Cardiac CT: Ca2+ score of 726 (91st %'ile).   Anxiety    Apical variant hypertrophic cardiomyopathy (HCC)    a. 01/2016 Echo: EF 55-60%, no rwma, mild AI, Ao root 39mm, mild MR, mod dil LA, mod TR, nl PASP; b. 12/2016 Cardiac MRI: EF 64%, sev apical hypertrophy up to 22mm in diastole. LGE in apical inf, lateral, and apical walls; c. 01/2018 Echo: EF 50-55%, gr2 DD. Mod-Sev apical hypertrophy. Mild AI. Mildly dil Ao root (3.8cm) and asc Ao (3.9cm). Sev dil LA. Nl RV fxn.   Dilated aortic root (HCC)    a. 01/2016 Ao root 39mm; b. 12/2016 Cardiac MRI: Mild aneurysmal dil of Asc Ao - 42mm; c. 01/2018 Echo: dil Ao root (3.8cm) and asc Ao (3.9cm).   Hyperaldosteronism (HCC)    Hypercholesteremia    Hypertension    Syncope    a. s/p MDT Linq.   Tremor     PAST SURGICAL HISTORY: Past Surgical History:  Procedure Laterality Date   HERNIA REPAIR     KNEE SURGERY     unknown which side   LOOP RECORDER INSERTION N/A 04/30/2017   Procedure: Loop Recorder Insertion;  Surgeon: [name], MD;  [facility] INVASIVE CV LAB;  Service: Cardiovascular;  Laterality: N/A;   SKIN GRAFT      FAMILY HISTORY: Family History  Problem Relation Age of Onset   Hypertension Mother    Heart attack Father    Hypertension Father    Spina bifida Sister     SOCIAL HISTORY: Social History   Socioeconomic History   Marital status: Married    Spouse name: Not on file   Number of children: 2   Years of education: some college   Highest education level: Not on file  Occupational History   Occupation: retired    Comment: [employer], Production designer, theatre/television/film of data center    Comment: pastor  Tobacco Use   Smoking status: Never   Smokeless tobacco: Never   Vaping Use   Vaping status: Never Used  Substance and Sexual Activity   Alcohol use: Yes    Alcohol/week: 5.0 standard drinks of alcohol    Types: 5 Glasses of wine per week    Comment: 1 per day   Drug use: Never   Sexual activity: Yes    Birth control/protection: None  Other Topics Concern   Not on file  Social History Narrative   Lives at home with his wife.   No daily use of  caffeine.   Right-handed.   Social Determinants of Health   Financial Resource Strain: Not on file  Food Insecurity: Not on file  Transportation Needs: Not on file  Physical Activity: Not on file  Stress: Not on file  Social Connections: Not on file  Intimate Partner Violence: Not on file      Levert Feinstein, M.D. Ph.D.  [facility] [address], [address] [place], [place] [address] Ph: [phone] Fax: [phone]  CC:  Irena Reichmann, DO [address] [place],  [place] [address]  Irena Reichmann, DO

## 2023-07-17 ENCOUNTER — Other Ambulatory Visit: Payer: Self-pay

## 2023-07-17 NOTE — Progress Notes (Signed)
Patient called in stating he went into the ER on 9-18 to be seen for back pain and when they had drawn labs it left a very large bruise on his arm in the [facility] area. He also stated that he went to see the neurologist on 09/24. He just wanted to know if he needed to do any thing about the bruising. I told him it was a very common that you get a bruise after having labs drawn. Patient had full understanding.

## 2023-07-26 ENCOUNTER — Telehealth: Payer: Self-pay | Admitting: Cardiovascular Disease

## 2023-07-26 MED ORDER — VALSARTAN 160 MG PO TABS: 160.0000 mg | ORAL_TABLET | Freq: Two times a day (BID) | ORAL | 3 refills | Status: DC

## 2023-07-26 NOTE — Telephone Encounter (Signed)
Call placed to the patient. He stated that for the last several days he has been having elevated blood pressures with a slight headache. He does not normally check his blood pressures but started when the headaches began. Blood pressures below are after he takes his medications.    158/95   162/94 146/89 157/92 168/93 154/98  He takes: Amlodipine 5 mg once daily Losartan 50 mg once daily Spironolactone 25 mg once daily Propranolol 20 mg TID PRN-this is prn. He takes one tablet daily in the morning.

## 2023-07-26 NOTE — Telephone Encounter (Signed)
Called patient and notified him of the following from Dr. Mariah Milling.  Blood pressures are elevated, would confirm he has not been missing any doses On last clinic visit had missed some doses of his medications Would recommend we hold losartan Start valsartan 160 mg twice a day Would continue to monitor pressure Thx TGollan   Patient verbalizes understanding. Prescriptions sent to preferred pharmacy.

## 2023-07-26 NOTE — Telephone Encounter (Signed)
Pt c/o BP issue: STAT if pt c/o blurred vision, one-sided weakness or slurred speech  1. What are your last 5 BP readings?   158/95   162/94 146/89 157/92 168/93 154/98  2. Are you having any other symptoms (ex. Dizziness, headache, blurred vision, passed out)?   A little headache  3. What is your BP issue?   Patient stated his BP has been reading high and wants to know next steps.

## 2023-08-02 ENCOUNTER — Inpatient Hospital Stay: Payer: Medicare Other | Admitting: Internal Medicine

## 2023-08-02 ENCOUNTER — Inpatient Hospital Stay: Payer: Medicare Other

## 2023-08-02 ENCOUNTER — Inpatient Hospital Stay: Payer: Medicare Other | Attending: Physician Assistant

## 2023-08-02 VITALS — BP 123/71 | HR 64 | Temp 97.7°F | Resp 17 | Ht 71.0 in | Wt 195.7 lb

## 2023-08-02 DIAGNOSIS — D751 Secondary polycythemia: Secondary | ICD-10-CM | POA: Insufficient documentation

## 2023-08-02 DIAGNOSIS — D696 Thrombocytopenia, unspecified: Secondary | ICD-10-CM | POA: Insufficient documentation

## 2023-08-02 DIAGNOSIS — D45 Polycythemia vera: Secondary | ICD-10-CM

## 2023-08-02 LAB — CBC WITH DIFFERENTIAL (CANCER CENTER ONLY)
Abs Immature Granulocytes: 0.02 10*3/uL (ref 0.00–0.07)
Basophils Absolute: 0 10*3/uL (ref 0.0–0.1)
Basophils Relative: 0 %
Eosinophils Absolute: 0.1 10*3/uL (ref 0.0–0.5)
Eosinophils Relative: 1 %
HCT: 51.3 % (ref 39.0–52.0)
Hemoglobin: 17.1 g/dL — ABNORMAL HIGH (ref 13.0–17.0)
Immature Granulocytes: 0 %
Lymphocytes Relative: 26 %
Lymphs Abs: 1.9 10*3/uL (ref 0.7–4.0)
MCH: 29.7 pg (ref 26.0–34.0)
MCHC: 33.3 g/dL (ref 30.0–36.0)
MCV: 89.2 fL (ref 80.0–100.0)
Monocytes Absolute: 0.8 10*3/uL (ref 0.1–1.0)
Monocytes Relative: 11 %
Neutro Abs: 4.6 10*3/uL (ref 1.7–7.7)
Neutrophils Relative %: 62 %
Platelet Count: 104 10*3/uL — ABNORMAL LOW (ref 150–400)
RBC: 5.75 MIL/uL (ref 4.22–5.81)
RDW: 13.1 % (ref 11.5–15.5)
WBC Count: 7.5 10*3/uL (ref 4.0–10.5)
nRBC: 0 % (ref 0.0–0.2)

## 2023-08-02 NOTE — Progress Notes (Signed)
[facility] [phone]   [phone]  OFFICE PROGRESS NOTE  Irena Reichmann, DO 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  PRIOR THERAPY: None  CURRENT THERAPY: None  INTERVAL HISTORY: Duane Warren 67 y.o. male returns to the clinic today for follow-up visit.Discussed the use of AI scribe software for clinical note transcription with the patient, who gave verbal consent to proceed.  History of Present Illness   The patient, Duane Warren, a 67 year old individual, was initially seen for elevated red blood cells (polycythemia) and low platelet count (thrombocytopenia). Molecular studies, including a JAK2 mutation panel, were conducted to investigate potential bone marrow issues. 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  The patient was informed about the need for a bone marrow biopsy to further investigate the detected variant. He was also informed about the procedure, which would be performed by an interventional radiologist and would involve sedation and local anesthesia.  The patient's current blood work showed persistently elevated red blood cells and low platelets. The patient was informed about the need for phlebotomy to decrease the viscosity of his blood.  The patient inquired about potential symptoms related to his condition. He was informed that the low platelet count could lead to easy bruising or bleeding, and the high red blood cell count could lead to headaches and increased risk for stroke or heart  disease if not managed.  The patient also asked about the impact of blood viscosity on circulation and was informed that it could indeed affect blood flow. He was advised to start taking a daily baby aspirin.  The patient did not report any current symptoms or discomfort during the consultation. The plan was to proceed with the bone marrow biopsy and aspirate, and to continue monitoring the patient's condition, with phlebotomy as needed to manage the red blood cell volume.       MEDICAL HISTORY: Past Medical History:  Diagnosis Date   Agatston coronary artery calcium score greater than 400    a. 12/2015 Cardiac CT: Ca2+ score of 726 (91st %'ile).   Anxiety    Apical variant hypertrophic cardiomyopathy (HCC)    a. 01/2016 Echo: EF 55-60%, no rwma, mild AI, Ao root 39mm, mild MR, mod dil LA, mod TR, nl PASP; b. 12/2016 Cardiac MRI: EF 64%, sev apical hypertrophy up to 22mm in diastole. LGE in apical inf, lateral, and apical walls; c. 01/2018 Echo: EF 50-55%, gr2 DD. Mod-Sev apical hypertrophy. Mild AI. Mildly dil Ao root (3.8cm) and asc Ao (3.9cm). Sev dil LA. Nl RV fxn.   Dilated aortic root (HCC)    a. 01/2016 Ao root 39mm; b. 12/2016 Cardiac MRI: Mild aneurysmal dil of Asc Ao - 42mm; c. 01/2018 Echo: dil Ao root (3.8cm) and asc Ao (3.9cm).   Hyperaldosteronism (HCC)    Hypercholesteremia    Hypertension    Syncope    a. s/p MDT Linq.   Tremor     ALLERGIES:  is allergic to molds & smuts.  MEDICATIONS:  Current Outpatient Medications  Medication Sig Dispense Refill   allopurinol (ZYLOPRIM) 100 MG tablet Take 100 mg by mouth daily.  amLODipine (NORVASC) 5 MG tablet Take 1 tablet (5 mg total) by mouth daily. 90 tablet 3   azelastine (OPTIVAR) 0.05 % ophthalmic solution Place 1 drop into both eyes 2 (two) times daily as needed (itchy/watery eyes). 6 mL 5   Azelastine-Fluticasone (DYMISTA) 137-50 MCG/ACT SUSP Place 2 sprays into both nostrils daily. (Patient taking differently: Place 2  sprays into both nostrils daily. PRN) 23 g 5   Cholecalciferol (VITAMIN D) 2000 units tablet Take 4,000 Units by mouth daily.      ezetimibe (ZETIA) 10 MG tablet Take 1 tablet (10 mg total) by mouth daily. 90 tablet 3   Fluticasone Propionate (XHANCE) 93 MCG/ACT EXHU Place 2 sprays into the nose daily. (Patient taking differently: Place 2 sprays into the nose daily. PRN) 16 mL 5   levalbuterol (XOPENEX HFA) 45 MCG/ACT inhaler Inhale 2 puffs into the lungs 4 (four) times daily. 15 g 1   MEDROL 4 MG TBPK tablet Take 4 mg by mouth as directed.     meloxicam (MOBIC) 15 MG tablet Take 15 mg by mouth daily.     Olopatadine-Mometasone (RYALTRIS) X543819 MCG/ACT SUSP 2 sprays in each nostril twice a day as needed for nasal symptoms. 29 g 5   propranolol (INDERAL) 20 MG tablet Take 1 tablet (20 mg total) by mouth 3 (three) times daily as needed. 90 tablet 3   rosuvastatin (CRESTOR) 5 MG tablet Take 1 tablet (5 mg total) by mouth daily. 90 tablet 3   sildenafil (VIAGRA) 100 MG tablet Take 50-100 mg by mouth daily as needed.     spironolactone (ALDACTONE) 25 MG tablet Take 1 tablet (25 mg total) by mouth daily. 90 tablet 3   testosterone cypionate (DEPOTESTOSTERONE CYPIONATE) 200 MG/ML injection Inject 200 mg into the muscle every 14 (fourteen) days.     valsartan (DIOVAN) 160 MG tablet Take 1 tablet (160 mg total) by mouth 2 (two) times daily. 180 tablet 3   No current facility-administered medications for this visit.    SURGICAL HISTORY:  Past Surgical History:  Procedure Laterality Date   HERNIA REPAIR     KNEE SURGERY     unknown which side   LOOP RECORDER INSERTION N/A 04/30/2017   Procedure: Loop Recorder Insertion;  Surgeon: [name], MD;  Location: [facility] INVASIVE CV LAB;  Service: Cardiovascular;  Laterality: N/A;   SKIN GRAFT      REVIEW OF SYSTEMS:  Constitutional: positive for fatigue Eyes: negative Ears, nose, mouth, throat, and face: negative Respiratory: negative Cardiovascular:  negative Gastrointestinal: negative Genitourinary:negative Integument/breast: negative Hematologic/lymphatic: negative Musculoskeletal:negative Neurological: negative Behavioral/Psych: negative Endocrine: negative Allergic/Immunologic: negative   PHYSICAL EXAMINATION: General appearance: alert, cooperative, and no distress Head: Normocephalic, without obvious abnormality, atraumatic Neck: no adenopathy, no JVD, supple, symmetrical, trachea midline, and thyroid not enlarged, symmetric, no tenderness/mass/nodules Lymph nodes: Cervical, supraclavicular, and axillary nodes normal. Resp: clear to auscultation bilaterally Back: symmetric, no curvature. ROM normal. No CVA tenderness. Cardio: regular rate and rhythm, S1, S2 normal, no murmur, click, rub or gallop GI: soft, non-tender; bowel sounds normal; no masses,  no organomegaly Extremities: extremities normal, atraumatic, no cyanosis or edema Neurologic: Alert and oriented X 3, normal strength and tone. Normal symmetric reflexes. Normal coordination and gait  ECOG PERFORMANCE STATUS: 1 - Symptomatic but completely ambulatory  Blood pressure 123/71, pulse 64, temperature 97.7 F (36.5 C), temperature source Tympanic, resp. rate 17, height 5\' 11"  (1.803 m), weight 195 lb 11.2 oz (88.8 kg), SpO2 100%.  LABORATORY DATA: Lab  Results  Component Value Date   WBC 10.4 07/04/2023   HGB 17.0 07/04/2023   HCT 50.1 07/04/2023   MCV 88.4 07/04/2023   PLT 142 (L) 07/04/2023      Chemistry      Component Value Date/Time   NA 139 07/04/2023 1136   NA 145 (H) 04/20/2020 1256   K 4.6 07/04/2023 1136   CL 106 07/04/2023 1136   CO2 27 07/04/2023 1136   BUN 20 07/04/2023 1136   BUN 17 04/20/2020 1256   CREATININE 1.09 07/04/2023 1136   CREATININE 1.20 06/25/2023 1316      Component Value Date/Time   CALCIUM 8.8 (L) 07/04/2023 1136   ALKPHOS 60 06/25/2023 1316   AST 22 06/25/2023 1316   ALT 20 06/25/2023 1316   BILITOT 0.8 06/25/2023  1316       RADIOGRAPHIC STUDIES: DG Lumbar Spine 2-3 Views  Result Date: 07/04/2023 CLINICAL DATA:  New onset lower back pain.  No known injury. EXAM: LUMBAR SPINE - 3 VIEW COMPARISON:  None Available. FINDINGS: There is no evidence of lumbar spine fracture. Grade 1 retrolisthesis at L5-S1. Intervertebral disc space narrowing at L5-S1. Multilevel facet arthropathy spanning L3-S1. IMPRESSION: 1. No acute fracture or traumatic listhesis. 2. Multilevel degenerative changes of the lumbar spine, most pronounced at L5-S1. Electronically Signed   By: Agustin Cree M.D.   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 Risk of stroke or heart disease if HCT continues to rise. No current symptoms. -Order bone marrow biopsy and aspirate to further investigate the mutation and potential risk for myelodysplastic syndrome, myeloproliferative disorder, or acute leukemia. -Perform phlebotomy to decrease HCT. -Start baby aspirin daily to reduce risk of thrombotic events.  Thrombocytopenia Low platelet count (104,000). No current symptoms of easy bruising or bleeding. -Monitor platelet count closely.  Follow-up in one month to review results of bone marrow biopsy and aspirate and assess response to phlebotomy and aspirin therapy.   He was advised to call immediately if he has any other concerning symptoms in the interval. The patient voices understanding of current disease status and treatment options and is in agreement with the current care plan.  All questions were answered. The patient knows to call the  clinic with any problems, questions or concerns. We can certainly see the patient much sooner if necessary.  The total time spent in the appointment was 30 minutes.  Disclaimer: This note was dictated with voice recognition software. Similar sounding words can inadvertently be transcribed and may not be corrected upon review.

## 2023-08-02 NOTE — Progress Notes (Signed)
Duane Warren presents today for phlebotomy per MD orders. Phlebotomy procedure started at 0903 and ended at 0911. 512 grams removed. Patient declined 30 minute observation. Patient stable, vital signs stable.  Patient tolerated procedure well. IV needle removed intact.

## 2023-08-02 NOTE — Patient Instructions (Signed)

## 2023-08-06 ENCOUNTER — Telehealth: Payer: Self-pay

## 2023-08-06 NOTE — Telephone Encounter (Signed)
Transition Care Management Follow-up Telephone Call Date of discharge and from where: 07/04/2023 [facility] How have you been since you were released from the hospital? Patient stated he is feeling much better. Any questions or concerns? No  Items Reviewed: Did the pt receive and understand the discharge instructions provided? Yes  Medications obtained and verified? Yes  Other? No  Any new allergies since your discharge? No  Dietary orders reviewed? Yes Do you have support at home? Yes   Follow up appointments reviewed:  PCP Hospital f/u appt confirmed? No  Scheduled to see  on  @ . Specialist Hospital f/u appt confirmed? Yes  Scheduled to see Levert Feinstein, MD on 07/10/2023 @ [facility]. Are transportation arrangements needed? No  If their condition worsens, is the pt aware to call PCP or go to the Emergency Dept.? Yes Was the patient provided with contact information for the PCP's office or ED? Yes Was to pt encouraged to call back with questions or concerns? Yes   Neeta Storey [facility]  [facility], [facility] Guide Direct Dial: [phone]  Website: [email or web address]

## 2023-08-30 ENCOUNTER — Other Ambulatory Visit: Payer: Self-pay | Admitting: Radiology

## 2023-08-30 DIAGNOSIS — D45 Polycythemia vera: Secondary | ICD-10-CM

## 2023-08-30 NOTE — Consult Note (Signed)
Chief Complaint: Patient was seen in consultation today for CT-guided bone marrow biopsy  Referring Physician(s): Mohamed,Mohamed  Supervising Physician: Roanna Banning  Patient Status: [facility] - Out-pt  History of Present Illness: Duane Warren is a 67 y.o. 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  He is scheduled today for CT-guided bone marrow biopsy to further investigate the mutation and potential risk for myelodysplastic syndrome, myeloproliferative disorder or acute leukemia.  Past Medical History:  Diagnosis Date   Agatston coronary artery calcium score greater than 400    a. 12/2015 Cardiac CT: Ca2+ score of 726 (91st %'ile).   Anxiety    Apical variant hypertrophic cardiomyopathy (HCC)    a. 01/2016 Echo: EF 55-60%, no rwma, mild AI, Ao root 39mm, mild MR, mod dil LA, mod TR, nl PASP; b. 12/2016 Cardiac MRI: EF 64%, sev apical hypertrophy up to 22mm in diastole. LGE in apical inf, lateral, and apical walls; c. 01/2018 Echo: EF 50-55%, gr2 DD. Mod-Sev apical hypertrophy. Mild AI. Mildly dil Ao root (3.8cm) and asc Ao (3.9cm). Sev dil LA. Nl RV fxn.   Dilated aortic root (HCC)    a. 01/2016 Ao root 39mm; b. 12/2016 Cardiac MRI: Mild aneurysmal dil of Asc Ao - 42mm; c. 01/2018 Echo: dil Ao root (3.8cm) and asc Ao (3.9cm).   Hyperaldosteronism (HCC)    Hypercholesteremia    Hypertension    Syncope    a. s/p MDT Linq.   Tremor     Past Surgical History:  Procedure Laterality Date   HERNIA REPAIR     KNEE SURGERY     unknown which side   LOOP RECORDER INSERTION N/A 04/30/2017   Procedure: Loop Recorder Insertion;  Surgeon: [name], MD;  Location: [facility] INVASIVE CV LAB;  Service: Cardiovascular;  Laterality: N/A;    SKIN GRAFT      Allergies: Molds & smuts  Medications: Prior to Admission medications   Medication Sig Start Date End Date Taking? Authorizing Provider  allopurinol (ZYLOPRIM) 100 MG tablet Take 100 mg by mouth daily.    [provider]  amLODipine (NORVASC) 5 MG tablet Take 1 tablet (5 mg total) by mouth daily. 04/02/23 03/27/24  Antonieta Iba, MD  azelastine (OPTIVAR) 0.05 % ophthalmic solution Place 1 drop into both eyes 2 (two) times daily as needed (itchy/watery eyes). 03/08/23   Hetty Blend, FNP  Azelastine-Fluticasone (DYMISTA) 137-50 MCG/ACT SUSP Place 2 sprays into both nostrils daily. Patient taking differently: Place 2 sprays into both nostrils daily. PRN 03/30/23   Hetty Blend, FNP  Cholecalciferol (VITAMIN D) 2000 units tablet Take 4,000 Units by mouth daily.     [provider]  ezetimibe (ZETIA) 10 MG tablet Take 1 tablet (10 mg total) by mouth daily. 04/09/23   Antonieta Iba, MD  Fluticasone Propionate Timmothy Sours) 93 MCG/ACT EXHU Place 2 sprays into the nose daily. Patient taking differently: Place 2 sprays into the nose daily. PRN 04/05/23   Hetty Blend, FNP  levalbuterol [facility] HFA) 45 MCG/ACT inhaler Inhale 2 puffs into the lungs 4 (four) times daily. 03/08/23   Ambs, Norvel Richards, FNP  MEDROL 4 MG TBPK tablet Take 4 mg by mouth as directed. 07/02/23   [provider]  meloxicam (MOBIC) 15 MG tablet Take 15 mg by mouth daily.    [provider]  Olopatadine-Mometasone (RYALTRIS) [phone] MCG/ACT SUSP 2 sprays in each nostril twice a day as needed for nasal symptoms. 03/08/23   Hetty Blend, FNP  propranolol (INDERAL) 20 MG tablet Take 1 tablet (20 mg total) by mouth 3 (three) times daily as needed. 07/10/23   Levert Feinstein, MD  rosuvastatin (CRESTOR) 5 MG tablet Take 1 tablet (5 mg total) by mouth daily. 04/09/23   Antonieta Iba, MD  sildenafil (VIAGRA) 100 MG tablet Take 50-100 mg by mouth daily as needed. 08/19/22   [provider]   spironolactone (ALDACTONE) 25 MG tablet Take 1 tablet (25 mg total) by mouth daily. 04/09/23   Antonieta Iba, MD  testosterone cypionate (DEPOTESTOSTERONE CYPIONATE) 200 MG/ML injection Inject 200 mg into the muscle every 14 (fourteen) days. 05/15/23   [provider]  valsartan (DIOVAN) 160 MG tablet Take 1 tablet (160 mg total) by mouth 2 (two) times daily. 07/26/23   Antonieta Iba, MD     Family History  Problem Relation Age of Onset   Hypertension Mother    Heart attack Father    Hypertension Father    Spina bifida Sister     Social History   Socioeconomic History   Marital status: Married    Spouse name: Not on file   Number of children: 2   Years of education: some college   Highest education level: Not on file  Occupational History   Occupation: retired    Comment: [employer], Production designer, theatre/television/film of data center    Comment: pastor  Tobacco Use   Smoking status: Never   Smokeless tobacco: Never  Vaping Use   Vaping status: Never Used  Substance and Sexual Activity   Alcohol use: Yes    Alcohol/week: 5.0 standard drinks of alcohol    Types: 5 Glasses of wine per week    Comment: 1 per day   Drug use: Never   Sexual activity: Yes    Birth control/protection: None  Other Topics Concern   Not on file  Social History Narrative   Lives at home with his wife.   No daily use of caffeine.   Right-handed.   Social Determinants of Health   Financial Resource Strain: Not on file  Food Insecurity: Not on file  Transportation Needs: Not on file  Physical Activity: Not on file  Stress: Not on file  Social Connections: Not on file      Review of Systems: denies fever,HA,CP,dyspnea, cough, abd/back pain,N/V or bleeding  Vital Signs:pending    Code Status:FULL CODE  Advance Care Plan: no documents on file    Physical Exam:awake/alert; chest-sl dim BS left base, rt clear; heart- RRR; abd-soft,+BS,NT; no LE edema; left chest loop recorder(not active per  pt)  Imaging: No results found.  Labs:  CBC: Recent Labs    06/25/23 1316 07/04/23 1136 08/02/23 0752  WBC 7.3 10.4 7.5  HGB 17.6* 17.0 17.1*  HCT 52.1* 50.1 51.3  PLT 154 142* 104*    COAGS: No results for input(s): "INR", "APTT" in the last 8760 hours.  BMP: Recent Labs    06/25/23 1316 07/04/23 1136  NA 141 139  K 4.1 4.6  CL 107 106  CO2 30 27  GLUCOSE 110* 113*  BUN 17 20  CALCIUM 9.5 8.8*  CREATININE 1.20 1.09  GFRNONAA >60 >60    LIVER FUNCTION TESTS: Recent Labs    06/25/23 1316  BILITOT 0.8  AST 22  ALT 20  ALKPHOS 60  PROT 6.5  ALBUMIN 4.2    TUMOR MARKERS: No results for input(s): "AFPTM", "CEA", "CA199", "CHROMGRNA" in the last 8760 hours.  Assessment and Plan: 67 y.o. 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  He is scheduled today for CT-guided bone marrow biopsy to further investigate the mutation and potential risk for myelodysplastic syndrome, myeloproliferative disorder or acute leukemia.Risks and benefits of procedure was discussed with the patient including, but not limited to bleeding, infection, damage to adjacent structures or low yield requiring additional tests.  All of the questions were answered and there is agreement to proceed.  Consent signed and in chart.   Thank you for this interesting consult.  I greatly enjoyed meeting MOSSIMO SZATKOWSKI and look forward to participating in their care.  A copy of this report was sent to the requesting provider on this date.  Electronically Signed: D. Jeananne Rama, PA-C 08/30/2023, 3:04 PM   I spent a total of  20 minutes   in face to face in clinical consultation, greater than 50% of which was counseling/coordinating care  for CT guided bone marrow biopsy

## 2023-08-31 ENCOUNTER — Ambulatory Visit (HOSPITAL_COMMUNITY)
Admission: RE | Admit: 2023-08-31 | Discharge: 2023-08-31 | Disposition: A | Discharge status: Home / Self Care | Payer: Medicare Other | Source: Ambulatory Visit | Attending: Internal Medicine | Admitting: Internal Medicine

## 2023-08-31 ENCOUNTER — Ambulatory Visit (HOSPITAL_COMMUNITY)
Admission: RE | Admit: 2023-08-31 | Discharge: 2023-08-31 | Disposition: A | Discharge status: Home / Self Care | Payer: Medicare Other | Source: Ambulatory Visit | Attending: Internal Medicine

## 2023-08-31 ENCOUNTER — Other Ambulatory Visit: Payer: Self-pay

## 2023-08-31 ENCOUNTER — Encounter (HOSPITAL_COMMUNITY): Payer: Self-pay

## 2023-08-31 DIAGNOSIS — D45 Polycythemia vera: Secondary | ICD-10-CM | POA: Insufficient documentation

## 2023-08-31 DIAGNOSIS — D751 Secondary polycythemia: Secondary | ICD-10-CM | POA: Insufficient documentation

## 2023-08-31 DIAGNOSIS — D471 Chronic myeloproliferative disease: Secondary | ICD-10-CM | POA: Insufficient documentation

## 2023-08-31 DIAGNOSIS — I422 Other hypertrophic cardiomyopathy: Secondary | ICD-10-CM | POA: Diagnosis not present

## 2023-08-31 DIAGNOSIS — I10 Essential (primary) hypertension: Secondary | ICD-10-CM | POA: Insufficient documentation

## 2023-08-31 DIAGNOSIS — E7800 Pure hypercholesterolemia, unspecified: Secondary | ICD-10-CM | POA: Insufficient documentation

## 2023-08-31 DIAGNOSIS — D696 Thrombocytopenia, unspecified: Secondary | ICD-10-CM | POA: Diagnosis not present

## 2023-08-31 LAB — CBC WITH DIFFERENTIAL/PLATELET
Abs Immature Granulocytes: 0.02 10*3/uL (ref 0.00–0.07)
Basophils Absolute: 0 10*3/uL (ref 0.0–0.1)
Basophils Relative: 1 %
Eosinophils Absolute: 0.1 10*3/uL (ref 0.0–0.5)
Eosinophils Relative: 1 %
HCT: 48.8 % (ref 39.0–52.0)
Hemoglobin: 16.3 g/dL (ref 13.0–17.0)
Immature Granulocytes: 0 %
Lymphocytes Relative: 22 %
Lymphs Abs: 1.6 10*3/uL (ref 0.7–4.0)
MCH: 29.5 pg (ref 26.0–34.0)
MCHC: 33.4 g/dL (ref 30.0–36.0)
MCV: 88.4 fL (ref 80.0–100.0)
Monocytes Absolute: 0.9 10*3/uL (ref 0.1–1.0)
Monocytes Relative: 12 %
Neutro Abs: 4.7 10*3/uL (ref 1.7–7.7)
Neutrophils Relative %: 64 %
Platelets: 126 10*3/uL — ABNORMAL LOW (ref 150–400)
RBC: 5.52 MIL/uL (ref 4.22–5.81)
RDW: 13.2 % (ref 11.5–15.5)
WBC: 7.3 10*3/uL (ref 4.0–10.5)
nRBC: 0 % (ref 0.0–0.2)

## 2023-08-31 MED ORDER — MIDAZOLAM HCL 2 MG/2ML IJ SOLN
INTRAMUSCULAR | Status: AC
  Filled 2023-08-31: qty 2

## 2023-08-31 MED ORDER — FENTANYL CITRATE (PF) 100 MCG/2ML IJ SOLN
INTRAMUSCULAR | Status: AC
  Filled 2023-08-31: qty 2

## 2023-08-31 MED ORDER — SODIUM CHLORIDE 0.9 % IV SOLN: INTRAVENOUS | Status: DC

## 2023-08-31 MED ORDER — NALOXONE HCL 0.4 MG/ML IJ SOLN
INTRAMUSCULAR | Status: AC
  Filled 2023-08-31: qty 1

## 2023-08-31 MED ORDER — FENTANYL CITRATE (PF) 100 MCG/2ML IJ SOLN
INTRAMUSCULAR | Status: AC | PRN
  Administered 2023-08-31: 25 ug via INTRAVENOUS
  Administered 2023-08-31: 50 ug via INTRAVENOUS

## 2023-08-31 MED ORDER — FLUMAZENIL 0.5 MG/5ML IV SOLN
INTRAVENOUS | Status: AC
  Filled 2023-08-31: qty 5

## 2023-08-31 MED ORDER — MIDAZOLAM HCL 2 MG/2ML IJ SOLN
INTRAMUSCULAR | Status: AC | PRN
  Administered 2023-08-31: 1 mg via INTRAVENOUS
  Administered 2023-08-31: .5 mg via INTRAVENOUS

## 2023-08-31 NOTE — Procedures (Signed)
Vascular and Interventional Radiology Procedure Note  Patient: Duane Warren DOB: 06/13/1956 Medical Record Number: [id number] Note Date/Time: 08/31/23 11:06 AM   Performing Physician: Roanna Banning, MD Assistant(s): None  Diagnosis: Polycythemia w thrombocytopenia   Procedure: BONE MARROW ASPIRATION and BIOPSY  Anesthesia: Conscious Sedation Complications: None Estimated Blood Loss: Minimal Specimens: Sent for Pathology  Findings:  Successful CT-guided bone marrow aspiration and biopsy A total of 1 cores were obtained. Hemostasis of the tract was achieved using Manual Pressure.  Plan: Bed rest for 1 hours.  See detailed procedure note with images in PACS. The patient tolerated the procedure well without incident or complication and was returned to Recovery in stable condition.    Roanna Banning, MD Vascular and Interventional Radiology Specialists [facility] Radiology   Pager. [phone] Clinic. [phone]

## 2023-08-31 NOTE — Discharge Instructions (Signed)
Please call Interventional Radiology clinic [phone] with any questions or concerns.  You may remove your dressing and shower tomorrow.  After the procedure, it is common to have: Soreness Bruising Mild pain  Follow these instructions at home:  Medication: Do not use Aspirin or ibuprofen products, such as Advil or Motrin, as it may increase bleeding You may resume your usual medications as ordered by your doctor If your doctor prescribed antibiotics, take them as directed. Do not stop taking them just because you feel better. You need to take the full course of antibiotics  Eating and drinking: Drink plenty of liquids to keep your urine pale yellow You can resume your regular diet as directed by your doctor   Care of the procedure site  Check your biopsy site every day until it heals  Keep the bandage dry. You can take the bandage off and shower tomorrow  If you are bleeding from the biopsy site, apply firm pressure on the area for at least 30 minutes  If directed, apply ice to your biopsy site 2-3 times a day.  o Put ice in a plastic bag  o Place a towel between your skin and the ice bag  o Leave the ice in place for 20 minutes 2-3 times a day  Activity Do not take baths, swim, or use a hot tub until your health care provider approves  Keep all follow-up visits as told by your doctor  Contact your doctor or seek immediate medical care if: You have bright red bleeding from the biopsy site that does not stop after 30 minutes of holding direct pressure on the site. You have signs of infection, such as: Increased pain, swelling, warmth, or redness at the biopsy site Red streaks leading from the biopsy site Yellow or green drainage from the biopsy site A fever (temperature over 100.69F) chills, or both

## 2023-08-31 NOTE — Sedation Documentation (Signed)
Patient transported to short stay. Meredith RN at the bedside to receive patient handoff. Site is clean, dry and intact, no drainage noted. Patient re-attached to cardiac monitor and to recover for an additional hour post procedural sedation.

## 2023-09-04 LAB — SURGICAL PATHOLOGY

## 2023-09-06 ENCOUNTER — Telehealth (INDEPENDENT_AMBULATORY_CARE_PROVIDER_SITE_OTHER): Payer: Self-pay | Admitting: *Deleted

## 2023-09-06 ENCOUNTER — Inpatient Hospital Stay: Payer: Medicare Other | Attending: Physician Assistant

## 2023-09-06 ENCOUNTER — Inpatient Hospital Stay (HOSPITAL_BASED_OUTPATIENT_CLINIC_OR_DEPARTMENT_OTHER): Payer: Medicare Other | Admitting: Internal Medicine

## 2023-09-06 VITALS — BP 131/82 | HR 67 | Temp 97.9°F | Resp 17 | Ht 71.0 in | Wt 196.4 lb

## 2023-09-06 DIAGNOSIS — D751 Secondary polycythemia: Secondary | ICD-10-CM | POA: Diagnosis present

## 2023-09-06 DIAGNOSIS — D696 Thrombocytopenia, unspecified: Secondary | ICD-10-CM | POA: Insufficient documentation

## 2023-09-06 DIAGNOSIS — D45 Polycythemia vera: Secondary | ICD-10-CM

## 2023-09-06 LAB — CMP (CANCER CENTER ONLY)
ALT: 21 U/L (ref 0–44)
AST: 22 U/L (ref 15–41)
Albumin: 4 g/dL (ref 3.5–5.0)
Alkaline Phosphatase: 60 U/L (ref 38–126)
Anion gap: 3 — ABNORMAL LOW (ref 5–15)
BUN: 17 mg/dL (ref 8–23)
CO2: 31 mmol/L (ref 22–32)
Calcium: 9.8 mg/dL (ref 8.9–10.3)
Chloride: 108 mmol/L (ref 98–111)
Creatinine: 1.22 mg/dL (ref 0.61–1.24)
GFR, Estimated: >60 mL/min (ref >60)
Glucose, Bld: 114 mg/dL — ABNORMAL HIGH (ref 70–99)
Potassium: 4.3 mmol/L (ref 3.5–5.1)
Sodium: 142 mmol/L (ref 135–145)
Total Bilirubin: 1 mg/dL (ref <1.2)
Total Protein: 6.1 g/dL — ABNORMAL LOW (ref 6.5–8.1)

## 2023-09-06 LAB — CBC WITH DIFFERENTIAL (CANCER CENTER ONLY)
Abs Immature Granulocytes: 0.02 10*3/uL (ref 0.00–0.07)
Basophils Absolute: 0.1 10*3/uL (ref 0.0–0.1)
Basophils Relative: 1 %
Eosinophils Absolute: 0.2 10*3/uL (ref 0.0–0.5)
Eosinophils Relative: 2 %
HCT: 48.9 % (ref 39.0–52.0)
Hemoglobin: 16.9 g/dL (ref 13.0–17.0)
Immature Granulocytes: 0 %
Lymphocytes Relative: 25 %
Lymphs Abs: 2.1 10*3/uL (ref 0.7–4.0)
MCH: 30.5 pg (ref 26.0–34.0)
MCHC: 34.6 g/dL (ref 30.0–36.0)
MCV: 88.1 fL (ref 80.0–100.0)
Monocytes Absolute: 0.9 10*3/uL (ref 0.1–1.0)
Monocytes Relative: 12 %
Neutro Abs: 5 10*3/uL (ref 1.7–7.7)
Neutrophils Relative %: 60 %
Platelet Count: 132 10*3/uL — ABNORMAL LOW (ref 150–400)
RBC: 5.55 MIL/uL (ref 4.22–5.81)
RDW: 13.1 % (ref 11.5–15.5)
WBC Count: 8.2 10*3/uL (ref 4.0–10.5)
nRBC: 0 % (ref 0.0–0.2)

## 2023-09-06 LAB — LACTATE DEHYDROGENASE: LDH: 152 U/L (ref 98–192)

## 2023-09-06 NOTE — Progress Notes (Signed)
[facility] [phone]   Fax:([phone]  OFFICE PROGRESS NOTE  Irena Reichmann, DO 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  PRIOR THERAPY: None  CURRENT THERAPY: None  INTERVAL HISTORY: Duane Warren 67 y.o. male returns to the clinic today for follow-up visit.  Discussed the use of AI scribe software for clinical note transcription with the patient, who gave verbal consent to proceed.  History of Present Illness   Duane Warren, a 67 year old patient, was previously identified to have elevated red blood cells on routine blood work. Further molecular studies, specifically a JAK2 mutation panel, were conducted. 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 The significance of this mutation is currently unknown, but it may increase the risk for leukemia or MDS.  To further investigate, a bone marrow biopsy and aspirin were performed ten days ago. The patient reported that the procedure went well. The results of the bone marrow biopsy did not show any evidence of leukemia, MDS, or myeloproliferative disorder.  The patient's recent blood work showed normal hemoglobin and hematocrit levels. However, the platelet count was on the lower side, although it showed signs of improvement. The patient did not report any complaints or symptoms at the time of the consultation.  The patient was examined during the consultation, and no abnormalities were found. The patient did not report any pain, bleeding, or bruising.       MEDICAL HISTORY: Past Medical History:  Diagnosis Date   Agatston coronary artery calcium score greater than 400     a. 12/2015 Cardiac CT: Ca2+ score of 726 (91st %'ile).   Anxiety    Apical variant hypertrophic cardiomyopathy (HCC)    a. 01/2016 Echo: EF 55-60%, no rwma, mild AI, Ao root 39mm, mild MR, mod dil LA, mod TR, nl PASP; b. 12/2016 Cardiac MRI: EF 64%, sev apical hypertrophy up to 22mm in diastole. LGE in apical inf, lateral, and apical walls; c. 01/2018 Echo: EF 50-55%, gr2 DD. Mod-Sev apical hypertrophy. Mild AI. Mildly dil Ao root (3.8cm) and asc Ao (3.9cm). Sev dil LA. Nl RV fxn.   Dilated aortic root (HCC)    a. 01/2016 Ao root 39mm; b. 12/2016 Cardiac MRI: Mild aneurysmal dil of Asc Ao - 42mm; c. 01/2018 Echo: dil Ao root (3.8cm) and asc Ao (3.9cm).   Hyperaldosteronism (HCC)    Hypercholesteremia    Hypertension    Syncope    a. s/p MDT Linq.   Tremor     ALLERGIES:  is allergic to molds & smuts.  MEDICATIONS:  Current Outpatient Medications  Medication Sig Dispense Refill   allopurinol (ZYLOPRIM) 100 MG tablet Take 100 mg by mouth daily.     amLODipine (NORVASC) 5 MG tablet Take 1 tablet (5 mg total) by mouth daily. 90 tablet 3   azelastine (OPTIVAR) 0.05 % ophthalmic solution Place 1 drop into both eyes 2 (two) times daily as needed (itchy/watery eyes). 6 mL 5   Azelastine-Fluticasone (DYMISTA) 137-50 MCG/ACT SUSP Place 2 sprays into both nostrils daily. (Patient taking differently: Place 2 sprays into both nostrils daily. PRN) 23 g 5   Cholecalciferol (VITAMIN D) 2000 units tablet Take 4,000 Units by mouth daily.  ezetimibe (ZETIA) 10 MG tablet Take 1 tablet (10 mg total) by mouth daily. 90 tablet 3   Fluticasone Propionate (XHANCE) 93 MCG/ACT EXHU Place 2 sprays into the nose daily. (Patient taking differently: Place 2 sprays into the nose daily. PRN) 16 mL 5   levalbuterol (XOPENEX HFA) 45 MCG/ACT inhaler Inhale 2 puffs into the lungs 4 (four) times daily. 15 g 1   MEDROL 4 MG TBPK tablet Take 4 mg by mouth as directed.     meloxicam (MOBIC) 15 MG tablet Take 15 mg by mouth  daily.     Olopatadine-Mometasone (RYALTRIS) [id number] MCG/ACT SUSP 2 sprays in each nostril twice a day as needed for nasal symptoms. 29 g 5   propranolol (INDERAL) 20 MG tablet Take 1 tablet (20 mg total) by mouth 3 (three) times daily as needed. 90 tablet 3   rosuvastatin (CRESTOR) 5 MG tablet Take 1 tablet (5 mg total) by mouth daily. 90 tablet 3   sildenafil (VIAGRA) 100 MG tablet Take 50-100 mg by mouth daily as needed.     spironolactone (ALDACTONE) 25 MG tablet Take 1 tablet (25 mg total) by mouth daily. 90 tablet 3   testosterone cypionate (DEPOTESTOSTERONE CYPIONATE) 200 MG/ML injection Inject 200 mg into the muscle every 14 (fourteen) days.     valsartan (DIOVAN) 160 MG tablet Take 1 tablet (160 mg total) by mouth 2 (two) times daily. 180 tablet 3   No current facility-administered medications for this visit.    SURGICAL HISTORY:  Past Surgical History:  Procedure Laterality Date   HERNIA REPAIR     KNEE SURGERY     unknown which side   LOOP RECORDER INSERTION N/A 04/30/2017   Procedure: Loop Recorder Insertion;  Surgeon: [name], MD;  Location: [facility] INVASIVE CV LAB;  Service: Cardiovascular;  Laterality: N/A;   SKIN GRAFT      REVIEW OF SYSTEMS:  Constitutional: negative Eyes: negative Ears, nose, mouth, throat, and face: negative Respiratory: negative Cardiovascular: negative Gastrointestinal: negative Genitourinary:negative Integument/breast: negative Hematologic/lymphatic: negative Musculoskeletal:negative Neurological: negative Behavioral/Psych: negative Endocrine: negative Allergic/Immunologic: negative   PHYSICAL EXAMINATION: General appearance: alert, cooperative, and no distress Head: Normocephalic, without obvious abnormality, atraumatic Neck: no adenopathy, no JVD, supple, symmetrical, trachea midline, and thyroid not enlarged, symmetric, no tenderness/mass/nodules Lymph nodes: Cervical, supraclavicular, and axillary nodes normal. Resp: clear to  auscultation bilaterally Back: symmetric, no curvature. ROM normal. No CVA tenderness. Cardio: regular rate and rhythm, S1, S2 normal, no murmur, click, rub or gallop GI: soft, non-tender; bowel sounds normal; no masses,  no organomegaly Extremities: extremities normal, atraumatic, no cyanosis or edema Neurologic: Alert and oriented X 3, normal strength and tone. Normal symmetric reflexes. Normal coordination and gait  ECOG PERFORMANCE STATUS: 1 - Symptomatic but completely ambulatory  Blood pressure 131/82, pulse 67, temperature 97.9 F (36.6 C), temperature source Temporal, resp. rate 17, height 5\' 11"  (1.803 m), weight 196 lb 6.4 oz (89.1 kg), SpO2 98%.  LABORATORY DATA: Lab Results  Component Value Date   WBC 8.2 09/06/2023   HGB 16.9 09/06/2023   HCT 48.9 09/06/2023   MCV 88.1 09/06/2023   PLT 132 (L) 09/06/2023      Chemistry      Component Value Date/Time   NA 139 07/04/2023 1136   NA 145 (H) 04/20/2020 1256   K 4.6 07/04/2023 1136   CL 106 07/04/2023 1136   CO2 27 07/04/2023 1136   BUN 20 07/04/2023 1136   BUN 17 04/20/2020 1256   CREATININE  1.09 07/04/2023 1136   CREATININE 1.20 06/25/2023 1316      Component Value Date/Time   CALCIUM 8.8 (L) 07/04/2023 1136   ALKPHOS 60 06/25/2023 1316   AST 22 06/25/2023 1316   ALT 20 06/25/2023 1316   BILITOT 0.8 06/25/2023 1316       RADIOGRAPHIC STUDIES: CT BONE MARROW BIOPSY & ASPIRATION  Result Date: 08/31/2023 INDICATION: Myeloproliferative disorder EXAM: CT GUIDED BONE MARROW ASPIRATION AND CORE BIOPSY MEDICATIONS: None. ANESTHESIA/SEDATION: Moderate (conscious) sedation was employed during this procedure. A total of Versed 1.5 mg and Fentanyl 75 mcg was administered intravenously. Moderate Sedation Time: 12 minutes. The patient's level of consciousness and vital signs were monitored continuously by radiology nursing throughout the procedure under my direct supervision. FLUOROSCOPY TIME:  CT dose; 204 mGycm  COMPLICATIONS: None immediate. Estimated blood loss: <5 mL PROCEDURE: RADIATION DOSE REDUCTION: This exam was performed according to the departmental dose-optimization program which includes automated exposure control, adjustment of the mA and/or kV according to patient size and/or use of iterative reconstruction technique. Informed written consent was obtained from the patient and/or patient's representative after a thorough discussion of the procedural risks, benefits and alternatives. All questions were addressed. Maximal Sterile Barrier Technique was utilized including caps, mask, sterile gowns, sterile gloves, sterile drape, hand hygiene and skin antiseptic. A timeout was performed prior to the initiation of the procedure. The patient was positioned prone and non-contrast localization CT was performed of the pelvis to demonstrate the iliac marrow spaces. Maximal barrier sterile technique utilized including caps, mask, sterile gowns, sterile gloves, large sterile drape, hand hygiene, and chlorhexidine prep. Under sterile conditions and local anesthesia, an 11 gauge coaxial bone biopsy needle was advanced into the RIGHT iliac marrow space. Needle position was confirmed with CT imaging. Initially, bone marrow aspiration was performed. Next, the 11 gauge outer cannula was utilized to obtain a 1 iliac bone marrow core biopsy. Needle was removed. Hemostasis was obtained with compression. The patient tolerated the procedure well. Samples were prepared with the cytotechnologist. IMPRESSION: Successful CT-guided bone marrow aspiration and biopsy Roanna Banning, MD Vascular and Interventional Radiology Specialists [facility] Radiology Electronically Signed   By: Roanna Banning M.D.   On: 08/31/2023 16:59    ASSESSMENT AND PLAN: This is a very pleasant 67 years old African-American male presented for evaluation of polycythemia as well as thrombocytopenia suspicious for myeloproliferative neoplasm with a rare DNMT3A p.  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  He had a bone marrow biopsy and aspirate performed recently that showed no concerning findings for MDS, MPN or acute leukemia.  JAK2, DNMT3A p. 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 A bone marrow biopsy 10 days ago showed no evidence of leukemia, MDS, or myeloproliferative disorders. Current labs: hemoglobin 16.9, hematocrit 48.9, platelet count 132,000. Asymptomatic. Discussed gene abnormality significance and need for regular monitoring. Advised to avoid NSAIDs like Aleve and Advil due to platelet count impact. Encouraged a healthy diet. - Monitor blood work every six months - Avoid NSAIDs like Aleve and Advil - Encourage a healthy diet  Follow-up - Schedule follow-up visit with Dr. Arbutus Ped in six months.   He was advised to call immediately if he has any other concerning symptoms in the interval. The patient voices understanding of current disease status and treatment options and is in agreement with the current care plan.  All questions were answered. The patient knows to call the clinic with any problems, questions or concerns. We can certainly see the patient much sooner if necessary.  The total time spent in the appointment was 30 minutes.  Disclaimer: This note was dictated with voice recognition software. Similar sounding words can inadvertently be transcribed and may not be corrected upon review.

## 2023-09-06 NOTE — Telephone Encounter (Signed)
Patient came in to get a weight check, to see if he qualifies to join our program. Patient did not qualify, BMI was 27.4. Weight strip has been sent to scanning.

## 2023-09-11 ENCOUNTER — Encounter (HOSPITAL_COMMUNITY): Payer: Self-pay

## 2023-09-23 NOTE — Progress Notes (Unsigned)
Follow Up Note  RE: Duane Warren MRN: [id number] DOB: Jul 30, 1956 Date of Office Visit: 09/24/2023  Referring provider: Irena Reichmann, DO Primary care provider: Irena Reichmann, DO  Chief Complaint: No chief complaint on file.  History of Present Illness: I had the pleasure of seeing Duane Warren for a follow up visit at the [facility] on 09/23/2023. He is a 67 y.o. male, who is being followed for allergic rhinoconjunctivitis and shortness of breath. His previous allergy office visit was on 04/05/2023 with Thermon Leyland, FNP. Today is a regular follow up visit.  Discussed the use of AI scribe software for clinical note transcription with the patient, who gave verbal consent to proceed.  History of Present Illness            Allergic rhinitis Continue allergen avoidance measures directed toward mold Begin Xhance 2 sprays in each nostril once a day for nasal congestion. If not covered with your insurance, then continue Flonase 2 sprays in each nostril once a day for nasal congestion Consider saline nasal rinses as needed for nasal symptoms. Use this before any medicated nasal sprays for best result   Allergic conjunctivitis Continue Optivar eyedrops 1 drop in each eye twice a day as needed for red or itchy eyes.  Some other choices inculde over the counter eye drops such as Pataday one drop in each eye once a day as needed for red, itchy eyes OR Zaditor one drop in each eye twice a day as needed for red itchy eyes. Avoid eye drops that say red eye relief as they may contain medications that dry out your eyes.    Shortness of breath Continue levalbuterol 2 puffs once every 4 hours as needed for shortness of breath or cough   Other allergic rhinitis Past history - Rhino conjunctivitis symptoms mainly during the spring and summer. 2021 skin testing showed: Positive to perennial mold. Interim history - doing well with below regimen. Some watery eyes. Did not  get eye drop rx. Eye check this year - unremarkable.  Continue environmental control measures - wear a mask when mowing the grass. Rinse your sinuses out with saline spray after mowing.  Continue dymista (fluticasone + azelastine nasal spray combination) 1 spray per nostril twice a day. Nasal saline spray (i.e., Simply Saline) or nasal saline lavage (i.e., NeilMed) is recommended as needed and prior to medicated nasal sprays. May use over the counter antihistamines such as Zyrtec (cetirizine), Claritin (loratadine), Allegra (fexofenadine), or Xyzal (levocetirizine) daily as needed. May take twice a day during flares.  May use azelastine eye drops twice a day as needed for itchy/watery eyes.   Allergic conjunctivitis of both eyes See assessment and plan as above for allergic rhinitis.    Shortness of breath Past history - Some shortness of breath at times. 2021 spirometry showed: possible restrictive disease with 8% improvement in FEV1 post bronchodilator treatment and feeling better. Interim history - Denies any symptoms and no albuterol use since last OV. May use albuterol rescue inhaler 2 puffs every 4 to 6 hours as needed for shortness of breath, chest tightness, coughing, and wheezing. May use albuterol rescue inhaler 2 puffs 5 to 15 minutes prior to strenuous physical activities. Monitor frequency of use.  Will get spirometry at next visit instead of today due to COVID-19 pandemic and trying to minimize any type of aerosolizing procedures at this time in the office.   Assessment and Plan: Duane Warren is a 67 y.o. male with: ***  Assessment and Plan              No follow-ups on file.  No orders of the defined types were placed in this encounter.  Lab Orders  No laboratory test(s) ordered today    Diagnostics: Spirometry:  Tracings reviewed. His effort: {Blank single:19197::"Good reproducible efforts.","It was hard to get consistent efforts and there is a question as to whether  this reflects a maximal maneuver.","Poor effort, data can not be interpreted."} FVC: ***L FEV1: ***L, ***% predicted FEV1/FVC ratio: ***% Interpretation: {Blank single:19197::"Spirometry consistent with mild obstructive disease","Spirometry consistent with moderate obstructive disease","Spirometry consistent with severe obstructive disease","Spirometry consistent with possible restrictive disease","Spirometry consistent with mixed obstructive and restrictive disease","Spirometry uninterpretable due to technique","Spirometry consistent with normal pattern","No overt abnormalities noted given today's efforts"}.  Please see scanned spirometry results for details.  Skin Testing: {Blank single:19197::"Select foods","Environmental allergy panel","Environmental allergy panel and select foods","Food allergy panel","None","Deferred due to recent antihistamines use"}. *** Results discussed with patient/family.   Medication List:  Current Outpatient Medications  Medication Sig Dispense Refill   allopurinol (ZYLOPRIM) 100 MG tablet Take 100 mg by mouth daily.     amLODipine (NORVASC) 5 MG tablet Take 1 tablet (5 mg total) by mouth daily. 90 tablet 3   azelastine (OPTIVAR) 0.05 % ophthalmic solution Place 1 drop into both eyes 2 (two) times daily as needed (itchy/watery eyes). 6 mL 5   Azelastine-Fluticasone (DYMISTA) 137-50 MCG/ACT SUSP Place 2 sprays into both nostrils daily. (Patient taking differently: Place 2 sprays into both nostrils daily. PRN) 23 g 5   Cholecalciferol (VITAMIN D) 2000 units tablet Take 4,000 Units by mouth daily.      ezetimibe (ZETIA) 10 MG tablet Take 1 tablet (10 mg total) by mouth daily. 90 tablet 3   Fluticasone Propionate (XHANCE) 93 MCG/ACT EXHU Place 2 sprays into the nose daily. (Patient taking differently: Place 2 sprays into the nose daily. PRN) 16 mL 5   levalbuterol (XOPENEX HFA) 45 MCG/ACT inhaler Inhale 2 puffs into the lungs 4 (four) times daily. 15 g 1   MEDROL 4  MG TBPK tablet Take 4 mg by mouth as directed.     meloxicam (MOBIC) 15 MG tablet Take 15 mg by mouth daily.     Olopatadine-Mometasone (RYALTRIS) X543819 MCG/ACT SUSP 2 sprays in each nostril twice a day as needed for nasal symptoms. 29 g 5   propranolol (INDERAL) 20 MG tablet Take 1 tablet (20 mg total) by mouth 3 (three) times daily as needed. 90 tablet 3   rosuvastatin (CRESTOR) 5 MG tablet Take 1 tablet (5 mg total) by mouth daily. 90 tablet 3   sildenafil (VIAGRA) 100 MG tablet Take 50-100 mg by mouth daily as needed.     spironolactone (ALDACTONE) 25 MG tablet Take 1 tablet (25 mg total) by mouth daily. 90 tablet 3   testosterone cypionate (DEPOTESTOSTERONE CYPIONATE) 200 MG/ML injection Inject 200 mg into the muscle every 14 (fourteen) days.     valsartan (DIOVAN) 160 MG tablet Take 1 tablet (160 mg total) by mouth 2 (two) times daily. 180 tablet 3   No current facility-administered medications for this visit.   Allergies: Allergies  Allergen Reactions   Molds & Smuts Other (See Comments)   I reviewed his past medical history, social history, family history, and environmental history and no significant changes have been reported from his previous visit.  Review of Systems  Constitutional:  Negative for appetite change, chills, fever and unexpected weight change.  HENT:  Negative for congestion and rhinorrhea.   Eyes:  Negative for itching.  Respiratory:  Negative for cough, chest tightness, shortness of breath and wheezing.   Cardiovascular:  Negative for chest pain.  Gastrointestinal:  Negative for abdominal pain.  Genitourinary:  Negative for difficulty urinating.  Skin:  Negative for rash.  Allergic/Immunologic: Positive for environmental allergies.  Neurological:  Negative for headaches.    Objective: There were no vitals taken for this visit. There is no height or weight on file to calculate BMI. Physical Exam Vitals and nursing note reviewed.  Constitutional:       Appearance: Normal appearance. He is well-developed.  HENT:     Head: Normocephalic and atraumatic.     Right Ear: Tympanic membrane and external ear normal.     Left Ear: Tympanic membrane and external ear normal.     Nose: Nose normal.     Mouth/Throat:     Mouth: Mucous membranes are moist.     Pharynx: Oropharynx is clear.  Eyes:     Conjunctiva/sclera: Conjunctivae normal.  Cardiovascular:     Rate and Rhythm: Normal rate and regular rhythm.     Heart sounds: Normal heart sounds. No murmur heard.    No friction rub. No gallop.  Pulmonary:     Effort: Pulmonary effort is normal.     Breath sounds: Normal breath sounds. No wheezing, rhonchi or rales.  Musculoskeletal:     Cervical back: Neck supple.  Skin:    General: Skin is warm.     Findings: No rash.  Neurological:     Mental Status: He is alert and oriented to person, place, and time.  Psychiatric:        Behavior: Behavior normal.    Previous notes and tests were reviewed. The plan was reviewed with the patient/family, and all questions/concerned were addressed.  It was my pleasure to see Duane Warren today and participate in his care. Please feel free to contact me with any questions or concerns.  Sincerely,  Wyline Mood, DO Allergy & Immunology  [facility] office: [phone] [facility] office: [phone]

## 2023-09-24 ENCOUNTER — Encounter: Payer: Self-pay | Admitting: Allergy

## 2023-09-24 ENCOUNTER — Ambulatory Visit (INDEPENDENT_AMBULATORY_CARE_PROVIDER_SITE_OTHER): Payer: Medicare Other | Admitting: Allergy

## 2023-09-24 ENCOUNTER — Other Ambulatory Visit: Payer: Self-pay

## 2023-09-24 VITALS — BP 116/70 | HR 68 | Temp 97.7°F | Resp 16

## 2023-09-24 DIAGNOSIS — J3089 Other allergic rhinitis: Secondary | ICD-10-CM | POA: Diagnosis not present

## 2023-09-24 DIAGNOSIS — H1013 Acute atopic conjunctivitis, bilateral: Secondary | ICD-10-CM | POA: Diagnosis not present

## 2023-09-24 DIAGNOSIS — R0602 Shortness of breath: Secondary | ICD-10-CM | POA: Diagnosis not present

## 2023-09-24 DIAGNOSIS — J328 Other chronic sinusitis: Secondary | ICD-10-CM | POA: Diagnosis not present

## 2023-09-24 MED ORDER — LEVALBUTEROL TARTRATE 45 MCG/ACT IN AERO: 2.0000 | INHALATION_SPRAY | RESPIRATORY_TRACT | 2 refills | Status: DC | PRN

## 2023-09-24 MED ORDER — AZELASTINE HCL 0.05 % OP SOLN: 1.0000 [drp] | Freq: Two times a day (BID) | OPHTHALMIC | 3 refills | Status: DC | PRN

## 2023-09-24 MED ORDER — XHANCE 93 MCG/ACT NA EXHU: INHALANT_SUSPENSION | NASAL | 5 refills | Status: DC

## 2023-09-24 NOTE — Patient Instructions (Addendum)
BRING ALL OF YOUR MEDICATIONS TO YOUR NEXT VISIT.   Environmental allergies 2021 skin testing positive to perennial mold. Start Xhance (fluticasone) nasal spray 2 sprays per nostril twice a day as needed for nasal congestion.  Sample given and demonstrated proper use. If this is not covered let us know and use  Use Flonase (fluticasone) nasal spray 1-2 sprays per nostril once a day as needed for nasal congestion.   Nasal saline spray (i.e., Simply Saline) or nasal saline lavage (i.e., NeilMed) is recommended as needed and prior to medicated nasal sprays. Use over the counter antihistamines such as Zyrtec (cetirizine), Claritin (loratadine), Allegra (fexofenadine), or Xyzal (levocetirizine) daily as needed. May take twice a day during allergy flares. May switch antihistamines every few months. Use Optivar (azelastine) eye drops 0.05% twice a day as needed for itchy/watery eyes.  Breathing May use levoalbuterol rescue inhaler 2 puffs every 4 to 6 hours as needed for shortness of breath, chest tightness, coughing, and wheezing.  Monitor frequency of use - if you need to use it more than twice per week on a consistent basis let us know.    Return in about 6 months (around 03/24/2024). Or sooner if needed.

## 2023-09-25 ENCOUNTER — Telehealth: Payer: Self-pay

## 2023-09-25 ENCOUNTER — Other Ambulatory Visit (HOSPITAL_COMMUNITY): Payer: Self-pay

## 2023-09-25 NOTE — Telephone Encounter (Signed)
Pharmacy Patient Advocate Encounter   Received notification from Physician's Office that prior authorization for [facility] 93MCG/ACT exhaler suspension is required/requested.   Insurance verification completed.   The patient is insured through [facility] .   Per test claim: PA required; PA submitted to above mentioned insurance via Fax Key/confirmation #/EOC [id number] Status is pending

## 2023-09-28 NOTE — Telephone Encounter (Signed)
Pharmacy Patient Advocate Encounter  Received notification from [employer] [facility] that Prior Authorization for [facility] 93MCG/ACT exhaler suspension has been APPROVED from 09-25-2023 to 09-24-2024

## 2023-10-14 ENCOUNTER — Other Ambulatory Visit: Payer: Self-pay | Admitting: Neurology

## 2023-10-18 ENCOUNTER — Telehealth: Payer: Self-pay

## 2023-10-18 ENCOUNTER — Encounter: Payer: Self-pay | Admitting: Internal Medicine

## 2023-10-18 NOTE — Telephone Encounter (Signed)
 Patient reports last week 2 bruises came up on his right leg and left leg feels like theres heat coming off of it.  Patient denies any redness and swelling of both legs.  Patient also sent a mychart message and routed to Dr. Sherrod to review.  Informed patient I will contact him with Dr. Blas recommendations.

## 2023-10-19 ENCOUNTER — Inpatient Hospital Stay (HOSPITAL_BASED_OUTPATIENT_CLINIC_OR_DEPARTMENT_OTHER): Payer: Medicare Other | Admitting: Physician Assistant

## 2023-10-19 ENCOUNTER — Other Ambulatory Visit: Payer: Self-pay

## 2023-10-19 ENCOUNTER — Inpatient Hospital Stay: Payer: Medicare Other | Attending: Physician Assistant

## 2023-10-19 VITALS — BP 122/71 | HR 58 | Temp 98.4°F | Resp 16 | Wt 197.8 lb

## 2023-10-19 DIAGNOSIS — T148XXA Other injury of unspecified body region, initial encounter: Secondary | ICD-10-CM

## 2023-10-19 DIAGNOSIS — D751 Secondary polycythemia: Secondary | ICD-10-CM | POA: Insufficient documentation

## 2023-10-19 DIAGNOSIS — D696 Thrombocytopenia, unspecified: Secondary | ICD-10-CM | POA: Insufficient documentation

## 2023-10-19 LAB — CBC WITH DIFFERENTIAL (CANCER CENTER ONLY)
Abs Immature Granulocytes: 0.02 10*3/uL (ref 0.00–0.07)
Basophils Absolute: 0 10*3/uL (ref 0.0–0.1)
Basophils Relative: 0 %
Eosinophils Absolute: 0.1 10*3/uL (ref 0.0–0.5)
Eosinophils Relative: 1 %
HCT: 49.8 % (ref 39.0–52.0)
Hemoglobin: 16.6 g/dL (ref 13.0–17.0)
Immature Granulocytes: 0 %
Lymphocytes Relative: 15 %
Lymphs Abs: 1.4 10*3/uL (ref 0.7–4.0)
MCH: 29.9 pg (ref 26.0–34.0)
MCHC: 33.3 g/dL (ref 30.0–36.0)
MCV: 89.7 fL (ref 80.0–100.0)
Monocytes Absolute: 1.1 10*3/uL — ABNORMAL HIGH (ref 0.1–1.0)
Monocytes Relative: 11 %
Neutro Abs: 7.1 10*3/uL (ref 1.7–7.7)
Neutrophils Relative %: 73 %
Platelet Count: 131 10*3/uL — ABNORMAL LOW (ref 150–400)
RBC: 5.55 MIL/uL (ref 4.22–5.81)
RDW: 13.3 % (ref 11.5–15.5)
WBC Count: 9.8 10*3/uL (ref 4.0–10.5)
nRBC: 0 % (ref 0.0–0.2)

## 2023-10-19 NOTE — Progress Notes (Signed)
 Symptom Management Consult Note [facility]    Patient Care Team: Duane Brunet, DO as PCP - General (Family Medicine) Duane Elspeth BROCKS, MD as PCP - Cardiology (Cardiology) Duane Evalene PARAS, MD as Consulting Physician (Cardiology) Duane Grange, MD as Referring Physician (Endocrinology) Duane Warren, Duane  E, Warren as Physician Assistant (Family Medicine)    Name / MRN / DOB: Duane Warren  [id number]  April 03, 1956   Date of visit: 10/19/2023   Chief Complaint/Reason for visit: bruising    Current Therapy: None   ASSESSMENT & PLAN: Patient is a 68 y.o. 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 Sherrod.  I have viewed most recent oncology note and lab work.    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 No known trauma or injury. No other signs of bleeding such as bleeding gums, hematuria, or melena. Platelet count is 131,000, within historical range. Hemoglobin is normal at 16.6. Bruises are dissipating and not significantly tender. With reassuring CBC no indication of intervention needed at this time. - Monitor for additional bruising or unexplained bleeding and he will report any new or worsening symptoms  #Warm Sensation in Left Leg Intermittent warm sensation in the left leg for the past couple of weeks without pain. No associated swelling, erythema, or pain. No recent travel or prolonged immobility. No history of neuropathy. No signs of DVT such as unilateral leg swelling or tenderness. Negative Homans sign.  - Monitor for any changes in  symptoms - Encouraged patient to keep a log of occurrences and associated activities and follow up with primary care or orthopedics if symptoms persist or worsen    Strict ED precautions discussed should symptoms worsen.   Heme/Onc History: Oncology History   No history exists.      Interval history-: Discussed the use of AI scribe software for clinical note transcription with the patient, who gave verbal consent to proceed.   Duane Warren is a 68 y.o. 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 Patient presents unaccompanied to visit today.  Patient reports presents two unexplained bruises on his leg, each about the size of a silver dollar. He first noticed these bruises last week and is unsure of their cause. He denies any recent trauma or history of similar bruising. He also denies any bleeding from other sites, such as gums or in stool or urine. He denies any significant fatigue or shortness of breath.  In addition to the bruising, Duane Warren has been experiencing a warm sensation in his left leg for the past couple of weeks. This sensation occurs sporadically, regardless of his activity level, and is not associated with any pain, redness, or swelling. He denies any recent long-distance travel or prolonged periods of immobility.   ROS  All other systems are reviewed and are negative for acute change except as noted in the HPI.    Allergies  Allergen Reactions   Molds & Smuts Other (See Comments)     Past Medical History:  Diagnosis Date   Agatston coronary artery calcium  score greater than 400    a. 12/2015 Cardiac CT: Ca2+ score of 726 (91st %'ile).   Anxiety    Apical variant hypertrophic cardiomyopathy (HCC)    a. 01/2016 Echo: EF 55-60%, no rwma, mild AI, Ao root 39mm, mild  MR, mod dil LA, mod TR, nl PASP; b. 12/2016 Cardiac MRI: EF 64%, sev apical hypertrophy up to 22mm in diastole. LGE in apical inf, lateral, and apical walls; c. 01/2018 Echo: EF 50-55%, gr2 DD. Mod-Sev apical hypertrophy. Mild AI. Mildly dil Ao root (3.8cm) and asc Ao (3.9cm). Sev dil LA. Nl RV fxn.   Dilated aortic root (HCC)    a. 01/2016 Ao root 39mm; b. 12/2016 Cardiac MRI: Mild aneurysmal dil of Asc Ao - 42mm; c. 01/2018 Echo: dil Ao root (3.8cm) and asc Ao (3.9cm).   Hyperaldosteronism (HCC)    Hypercholesteremia    Hypertension    Syncope    a. s/p MDT Linq.   Tremor      Past Surgical History:  Procedure Laterality Date   HERNIA REPAIR     KNEE SURGERY     unknown which side   LOOP RECORDER INSERTION N/A 04/30/2017   Procedure: Loop Recorder Insertion;  Surgeon: Duane Elspeth BROCKS, MD;  Location: [facility] INVASIVE CV LAB;  Service: Cardiovascular;  Laterality: N/A;   SKIN GRAFT      Social History   Socioeconomic History   Marital status: Married    Spouse name: Not on file   Number of children: 2   Years of education: some college   Highest education level: Not on file  Occupational History   Occupation: retired    Comment: Government Social Research Officer, production designer, theatre/television/film of data center    Comment: pastor  Tobacco Use   Smoking status: Never   Smokeless tobacco: Never  Vaping Use   Vaping status: Never Used  Substance and Sexual Activity   Alcohol use: Yes    Alcohol/week: 5.0 standard drinks of alcohol    Types: 5 Glasses of wine per week    Comment: 1 per day   Drug use: Never   Sexual activity: Yes    Birth control/protection: None  Other Topics Concern   Not on file  Social History Narrative   Lives at home with his wife.   No daily use of caffeine.   Right-handed.   Social Drivers of Corporate Investment Banker Strain: Not on file  Food Insecurity: Not on file  Transportation Needs: Not on file  Physical Activity: Not on file  Stress: Not on file  Social Connections: Not on file   Intimate Partner Violence: Not on file    Family History  Problem Relation Age of Onset   Hypertension Mother    Heart attack Father    Hypertension Father    Spina bifida Sister      Current Outpatient Medications:    allopurinol (ZYLOPRIM) 100 MG tablet, Take 100 mg by mouth daily., Disp: , Rfl:    amLODipine  (NORVASC ) 5 MG tablet, Take 1 tablet (5 mg total) by mouth daily., Disp: 90 tablet, Rfl: 3   azelastine  (OPTIVAR ) 0.05 % ophthalmic solution, Place 1 drop into both eyes 2 (two) times daily as needed (itchy/watery eyes)., Disp: 6 mL, Rfl: 3   busPIRone (BUSPAR) 5 MG tablet, Take 5 mg by mouth 2 (two) times daily., Disp: , Rfl:    Cholecalciferol (VITAMIN D ) 2000 units tablet, Take 4,000 Units by mouth daily. ,  Disp: , Rfl:    ezetimibe  (ZETIA ) 10 MG tablet, Take 1 tablet (10 mg total) by mouth daily., Disp: 90 tablet, Rfl: 3   Fluticasone  Propionate (XHANCE ) 93 MCG/ACT EXHU, 1-2 sprays per nostril twice a day., Disp: 32 mL, Rfl: 5   levalbuterol  (XOPENEX  HFA) 45 MCG/ACT inhaler, Inhale 2 puffs into the lungs every 4 (four) hours as needed for wheezing or shortness of breath (coughing fits)., Disp: 1 each, Rfl: 2   propranolol  (INDERAL ) 20 MG tablet, TAKE 1 TABLET BY MOUTH 3 TIMES DAILY AS NEEDED., Disp: 270 tablet, Rfl: 1   rosuvastatin  (CRESTOR ) 5 MG tablet, Take 1 tablet (5 mg total) by mouth daily., Disp: 90 tablet, Rfl: 3   sildenafil (VIAGRA) 100 MG tablet, Take 50-100 mg by mouth daily as needed., Disp: , Rfl:    spironolactone  (ALDACTONE ) 25 MG tablet, Take 1 tablet (25 mg total) by mouth daily., Disp: 90 tablet, Rfl: 3   tamsulosin (FLOMAX) 0.4 MG CAPS capsule, Take 0.4 mg by mouth at bedtime., Disp: , Rfl:    valsartan  (DIOVAN ) 160 MG tablet, Take 1 tablet (160 mg total) by mouth 2 (two) times daily., Disp: 180 tablet, Rfl: 3  PHYSICAL EXAM: ECOG FS:1 - Symptomatic but completely ambulatory    Vitals:   10/19/23 1330  BP: 122/71  Pulse: (!) 58  Resp: 16  Temp:  98.4 F (36.9 C)  TempSrc: Oral  Weight: 197 lb 12.8 oz (89.7 kg)   Physical Exam Vitals and nursing note reviewed.  Constitutional:      Appearance: He is not ill-appearing or toxic-appearing.  HENT:     Head: Normocephalic.  Eyes:     Conjunctiva/sclera: Conjunctivae normal.  Cardiovascular:     Pulses: Normal pulses.  Pulmonary:     Effort: Pulmonary effort is normal.  Abdominal:     General: There is no distension.  Musculoskeletal:     Cervical back: Normal range of motion.     Right lower leg: No edema.     Left lower leg: No edema.     Comments: Homans sign absent in left lower extremity, no lower extremity edema, no palpable cords, compartments are soft    Skin:    General: Skin is warm and dry.     Capillary Refill: Capillary refill takes less than 2 seconds.     Findings: Bruising present.     Comments: Approximately quarter sized area of ecchymosis on medial aspect of right knee and lower right calf. No edema.   Neurological:     Mental Status: He is alert.        LABORATORY DATA: I have reviewed the data as listed    Latest Ref Rng & Units 10/19/2023    1:11 PM 09/06/2023    8:54 AM 08/31/2023   10:00 AM  CBC  WBC 4.0 - 10.5 K/uL 9.8  8.2  7.3   Hemoglobin 13.0 - 17.0 g/dL 83.3  83.0  83.6   Hematocrit 39.0 - 52.0 % 49.8  48.9  48.8   Platelets 150 - 400 K/uL 131  132  126         Latest Ref Rng & Units 09/06/2023    8:54 AM 07/04/2023   11:36 AM 06/25/2023    1:16 PM  CMP  Glucose 70 - 99 mg/dL 885  886  889   BUN 8 - 23 mg/dL 17  20  17    Creatinine 0.61 - 1.24 mg/dL 8.77  8.90  8.79  Sodium 135 - 145 mmol/L 142  139  141   Potassium 3.5 - 5.1 mmol/L 4.3  4.6  4.1   Chloride 98 - 111 mmol/L 108  106  107   CO2 22 - 32 mmol/L 31  27  30    Calcium  8.9 - 10.3 mg/dL 9.8  8.8  9.5   Total Protein 6.5 - 8.1 g/dL 6.1   6.5   Total Bilirubin <1.2 mg/dL 1.0   0.8   Alkaline Phos 38 - 126 U/L 60   60   AST 15 - 41 U/L 22   22   ALT 0 - 44 U/L 21    20        RADIOGRAPHIC STUDIES (from last 24 hours if applicable) I have personally reviewed the radiological images as listed and agreed with the findings in the report. No results found.      Visit Diagnosis: 1. Thrombocytopenia (HCC)   2. Bruise      No orders of the defined types were placed in this encounter.   All questions were answered. The patient knows to call the clinic with any problems, questions or concerns. No barriers to learning was detected.  A total of more than 20 minutes were spent on this encounter with face-to-face time and non-face-to-face time, including preparing to see the patient, ordering tests and/or medications, counseling the patient and coordination of care as outlined above.    Thank you for allowing me to participate in the care of this patient.    Myda Detwiler E  Walisiewicz, Warren Department of Hematology/Oncology [facility] at [facility] Phone: [phone]  [phone]    10/19/2023 3:23 PM

## 2023-10-25 ENCOUNTER — Telehealth: Payer: Self-pay | Admitting: Medical Oncology

## 2023-10-25 NOTE — Telephone Encounter (Signed)
 Pt seen for his bruising.

## 2023-11-29 ENCOUNTER — Telehealth: Payer: Self-pay | Admitting: Cardiovascular Disease

## 2023-11-29 NOTE — Telephone Encounter (Signed)
Pt c/o Shortness Of Breath: STAT if SOB developed within the last 24 hours or pt is noticeably SOB on the phone  1. Are you currently SOB (can you hear that pt is SOB on the phone)?  No   2. How long have you been experiencing SOB?  Over 1 month   3. Are you SOB when sitting or when up moving around?  When up and moving around   4. Are you currently experiencing any other symptoms?  No

## 2023-11-29 NOTE — Telephone Encounter (Signed)
Called and spoke with patient. Patient with complaint of shortness of breath with exertion for 2- 3 months. Patient report that he notices it when climbing stairs or carrying heavy objects. Patient denies any chest pain, swelling or weight gain. Patient states that he is not able to obtain vital signs while on the phone. Patient reports that he is taking Amlodipine, Spironolactone and Valsartan as prescribed. Will forward to MD for recommendations.

## 2023-11-30 NOTE — Telephone Encounter (Signed)
Called patient and notified him of the following from Dr. Mariah Milling.  Etiology of shortness of breath unclear  Would recommend follow-up appointment to discuss various options  Needs to closely monitor blood pressure which can contribute to symptoms  Might need cardiac CTA to rule out blockages,  Also need to rule out amyloid disease given severity of his LVH on prior echo (that may require a imaging test in [place] to be arranged)  Thx  TGollan   Patient verbalizes understanding. Patient scheduled in clinic 12/03/23.

## 2023-12-02 NOTE — Progress Notes (Unsigned)
Cardiology Office Note  Date:  12/03/2023   ID:  Duane Warren, DOB 1955-11-10, MRN [id number]  PCP:  Irena Reichmann, DO   Chief Complaint  Patient presents with   Shortness of Breath    Patient c/o shortness of breath when carrying an object up 25 steps.     HPI:  Duane Warren is a pleasant 68 year old gentleman with  history of gout, hernia repair, knee arthroscopy.  history of primary aldosteronism ,on Aldactone. apical hypertrophic cardiomyopathy  Nonsustained VT Last visit March 2017 had CT coronary calcium score, Score of 726, Mildly dilated ascending aorta 41 mm,  Syncope He presents today for follow-up of his Coronary artery disease, apical LVH on echocardiogram  Last seen by myself in clinic June 2024 Recent phone call detailing symptoms of shortness of breath Review of prior notes indicates symptoms of shortness of breath on prior clinic visits  Recent imaging reviewed Echocardiogram March 2024 Moderate LVH, EF 50 to 55% moderately dilated left atrium Images pulled up and reviewed, impressive apical and periapical LVH  Cardiac MRI March 2018 Severe hypertrophy apical regions  Previously seen by EP, Dr. Graciela Husbands for VT, periapical hypertrophy Has loop in place, battery expired, did not want to take it out seen by Dr. Graciela Husbands July 2021, decision made that ICD was not needed  Reports he has been more sedentary at baseline, no exercise program Has appreciated more shortness of breath particularly when climbing stairs or heavy exertion Uncertain if it could be his conditioning  Takes propranolol once a day in the morning for tremor, feels it has helped  Blood pressure relatively well-controlled Low on today's visit  Taking Amlodipine 5 mg once daily, evening Valsartan 160 twice daily Spirolactone 25 mg in the morning Propanolol 20 mg one daily (prescribed as TID PRN)  Echocardiogram March 2024, low normal action fraction, grade 2 diastolic dysfunction No  significant sleep apnea  EKG personally reviewed by myself on todays visit EKG Interpretation Date/Time:  Monday December 03 2023 15:39:26 EST Ventricular Rate:  59 PR Interval:  154 QRS Duration:  88 QT Interval:  452 QTC Calculation: 447 R Axis:   38  Text Interpretation: Sinus bradycardia with marked sinus arrhythmia Minimal voltage criteria for LVH, may be normal variant ( Sokolow-Lyon ) Marked ST abnormality, possible anterior subendocardial injury When compared with ECG of 04-Jul-2023 11:30, No significant change was found Confirmed by Julien Nordmann [phone]) on 12/03/2023 3:43:04 PM   other past medical history  previouslyld that he might have depression and was started on a medication for depression. He did not feel well and was weaned off the medication. It was recommended that he start an alternate medication but he did not start this. He reports that since then, his dizziness has resolved.     he had extensive lab work in March 2014. Total cholesterol 189, HDL 56, LDL 104, lipoprotein (a) 61, CRP 3.4, hemoglobin A1c 5.5, normal LFTs, testosterone 387, Apo B 72 TSH 2.1 Reports that there is mild family history of cancer, hypertension him a CAD Blood pressure at home is typically in the 120-130 range over 60-70    PMH:   has a past medical history of Agatston coronary artery calcium score greater than 400, Anxiety, Apical variant hypertrophic cardiomyopathy (HCC), Dilated aortic root (HCC), Hyperaldosteronism (HCC), Hypercholesteremia, Hypertension, Syncope, and Tremor.  PSH:    Past Surgical History:  Procedure Laterality Date   HERNIA REPAIR     KNEE SURGERY     unknown which  side   LOOP RECORDER INSERTION N/A 04/30/2017   Procedure: Loop Recorder Insertion;  Surgeon: [name], MD;  Location: [facility] INVASIVE CV LAB;  Service: Cardiovascular;  Laterality: N/A;   SKIN GRAFT      Current Outpatient Medications  Medication Sig Dispense Refill   allopurinol (ZYLOPRIM) 100  MG tablet Take 100 mg by mouth daily.     amLODipine (NORVASC) 5 MG tablet Take 1 tablet (5 mg total) by mouth daily. 90 tablet 3   azelastine (OPTIVAR) 0.05 % ophthalmic solution Place 1 drop into both eyes 2 (two) times daily as needed (itchy/watery eyes). 6 mL 3   busPIRone (BUSPAR) 5 MG tablet Take 5 mg by mouth 2 (two) times daily.     Cholecalciferol (VITAMIN D) 2000 units tablet Take 4,000 Units by mouth daily.      colchicine 0.6 MG tablet Take 0.6 mg by mouth as needed.     ezetimibe (ZETIA) 10 MG tablet Take 1 tablet (10 mg total) by mouth daily. 90 tablet 3   Fluticasone Propionate (XHANCE) 93 MCG/ACT EXHU 1-2 sprays per nostril twice a day. 32 mL 5   levalbuterol (XOPENEX HFA) 45 MCG/ACT inhaler Inhale 2 puffs into the lungs every 4 (four) hours as needed for wheezing or shortness of breath (coughing fits). 1 each 2   propranolol (INDERAL) 20 MG tablet TAKE 1 TABLET BY MOUTH 3 TIMES DAILY AS NEEDED. 270 tablet 1   rosuvastatin (CRESTOR) 5 MG tablet Take 1 tablet (5 mg total) by mouth daily. 90 tablet 3   sildenafil (VIAGRA) 100 MG tablet Take 50-100 mg by mouth daily as needed.     spironolactone (ALDACTONE) 25 MG tablet Take 1 tablet (25 mg total) by mouth daily. 90 tablet 3   tamsulosin (FLOMAX) 0.4 MG CAPS capsule Take 0.4 mg by mouth at bedtime.     valsartan (DIOVAN) 160 MG tablet Take 1 tablet (160 mg total) by mouth 2 (two) times daily. 180 tablet 3   No current facility-administered medications for this visit.    Allergies:   Molds & smuts   Social History:  The patient  reports that he has never smoked. He has never used smokeless tobacco. He reports current alcohol use of about 5.0 standard drinks of alcohol per week. He reports that he does not use drugs.   Family History:   family history includes Heart attack in his father; Hypertension in his father and mother; Spina bifida in his sister.    Review of Systems: Review of Systems  Constitutional: Negative.   HENT:  Negative.    Respiratory: Negative.    Cardiovascular: Negative.   Gastrointestinal: Negative.   Musculoskeletal: Negative.   Neurological: Negative.   Psychiatric/Behavioral: Negative.    All other systems reviewed and are negative.   PHYSICAL EXAM: VS:  BP 110/72 (BP Location: Left Arm, Patient Position: Sitting, Cuff Size: Normal)   Pulse (!) 59   Ht 5\' 11"  (1.803 m)   Wt 197 lb 2 oz (89.4 kg)   SpO2 98%   BMI 27.49 kg/m  , BMI Body mass index is 27.49 kg/m. Constitutional:  oriented to person, place, and time. No distress.  HENT:  Head: Grossly normal Eyes:  no discharge. No scleral icterus.  Neck: No JVD, no carotid bruits  Cardiovascular: Regular rate and rhythm, no murmurs appreciated Pulmonary/Chest: Clear to auscultation bilaterally, no wheezes or rails Abdominal: Soft.  no distension.  no tenderness.  Musculoskeletal: Normal range of motion Neurological:  normal muscle tone. Coordination normal. No atrophy Skin: Skin warm and dry Psychiatric: normal affect, pleasant  Recent Labs: 09/06/2023: ALT 21; BUN 17; Creatinine 1.22; Potassium 4.3; Sodium 142 10/19/2023: Hemoglobin 16.6; Platelet Count 131    Lipid Panel Lab Results  Component Value Date   CHOL 129 01/29/2020   HDL 50 01/29/2020   LDLCALC 56 01/29/2020   TRIG 135 01/29/2020      Wt Readings from Last 3 Encounters:  12/03/23 197 lb 2 oz (89.4 kg)  10/19/23 197 lb 12.8 oz (89.7 kg)  09/06/23 196 lb 6.4 oz (89.1 kg)     ASSESSMENT AND PLAN:  Essential hypertension Blood pressure is well controlled on today's visit. No changes made to the medications.  Fatigue/insomnia/low energy Followed by endocrine Recommend regular walking/exercise program  Atherosclerosis of both carotid arteries Low-grade disease less than 39% disease bilaterally in July 2021 Cholesterol at goal  Class 2 obesity due to excess calories without serious comorbidity with body mass index (BMI) of 35.0 to 35.9 in adult We  have encouraged continued exercise, careful diet management in an effort to lose weight.  Coronary artery disease involving native coronary artery of native heart with chronic stable angina Elevated CT coronary calcium score, 700 Ports worsening shortness of breath, unable to exclude ischemia We have ordered cardiac CTA for further evaluation  Mixed hyperlipidemia Continue Crestor 5 mg daily with Zetia 10 Goal LDL less than 70  Apical thickening  moderate to severe apical thickening seen on cardiac MRI 2018, recent echo Seen by EP, no ICD indicated loop monitor previously placed with rare VT, battery now expired,  Rare episodes of dizziness sometimes in a sitting position Recommended he call if symptoms get worse   Orders Placed This Encounter  Procedures   EKG 12-Lead      Signed, Dossie Arbour, M.D., Ph.D. 12/03/2023  [facility] [place], [place] [phone]

## 2023-12-03 ENCOUNTER — Ambulatory Visit: Payer: Medicare Other | Attending: Cardiovascular Disease | Admitting: Cardiovascular Disease

## 2023-12-03 ENCOUNTER — Encounter: Payer: Self-pay | Admitting: Cardiovascular Disease

## 2023-12-03 VITALS — BP 110/72 | HR 59 | Ht 71.0 in | Wt 197.1 lb

## 2023-12-03 DIAGNOSIS — I10 Essential (primary) hypertension: Secondary | ICD-10-CM | POA: Diagnosis not present

## 2023-12-03 DIAGNOSIS — R072 Precordial pain: Secondary | ICD-10-CM | POA: Insufficient documentation

## 2023-12-03 DIAGNOSIS — R0609 Other forms of dyspnea: Secondary | ICD-10-CM | POA: Insufficient documentation

## 2023-12-03 DIAGNOSIS — I422 Other hypertrophic cardiomyopathy: Secondary | ICD-10-CM | POA: Diagnosis not present

## 2023-12-03 DIAGNOSIS — I739 Peripheral vascular disease, unspecified: Secondary | ICD-10-CM | POA: Diagnosis not present

## 2023-12-03 DIAGNOSIS — R42 Dizziness and giddiness: Secondary | ICD-10-CM | POA: Diagnosis present

## 2023-12-03 DIAGNOSIS — I2510 Atherosclerotic heart disease of native coronary artery without angina pectoris: Secondary | ICD-10-CM | POA: Insufficient documentation

## 2023-12-03 DIAGNOSIS — E785 Hyperlipidemia, unspecified: Secondary | ICD-10-CM | POA: Diagnosis present

## 2023-12-03 DIAGNOSIS — R55 Syncope and collapse: Secondary | ICD-10-CM | POA: Diagnosis present

## 2023-12-03 MED ORDER — METOPROLOL TARTRATE 50 MG PO TABS: 50.0000 mg | ORAL_TABLET | Freq: Once | ORAL | 0 refills | Status: DC

## 2023-12-03 NOTE — Patient Instructions (Addendum)
Medication Instructions:  No changes  Take Metoprolol Tartrate 50 MG once 2 hours prior to CT.  Hold Amlodipine 5 MG and Propranolol 20 MG the morning of your CT.  If you need a refill on your cardiac medications before your next appointment, please call your pharmacy.   Lab work: No new labs needed  Testing/Procedures:   Your cardiac CT will be scheduled at one of the below locations:    [facility] [address] [place], [place] [address] [phone]  OR   [facility] [address] [place], [place] [address] [phone]    If scheduled at [facility] or [facility], please arrive 15 mins early for check-in and test prep.  There is spacious parking and easy access to the radiology department from the [facility] entrance. Please enter here and check-in with the desk attendant.   Please follow these instructions carefully (unless otherwise directed):  An IV will be required for this test and Nitroglycerin will be given.  Hold all erectile dysfunction medications at least 3 days (72 hrs) prior to test. (Ie viagra, cialis, sildenafil, tadalafil, etc)   On the Night Before the Test: Be sure to Drink plenty of water. Do not consume any caffeinated/decaffeinated beverages or chocolate 12 hours prior to your test. Do not take any antihistamines 12 hours prior to your test.  On the Day of the Test: Drink plenty of water until 1 hour prior to the test. Do not eat any food 1 hour prior to test. You may take your regular medications prior to the test except Amlodipine, Propranolol and Spironolactone.  Take metoprolol (Lopressor) 50 MG  two hours prior to test. If you take Furosemide/Hydrochlorothiazide/Spironolactone/Chlorthalidone, please HOLD on the morning of the test. Patients who wear a continuous glucose monitor MUST remove the device  prior to scanning.       After the Test: Drink plenty of water. After receiving IV contrast, you may experience a mild flushed feeling. This is normal. On occasion, you may experience a mild rash up to 24 hours after the test. This is not dangerous. If this occurs, you can take Benadryl 25 mg, Zyrtec, Claritin, or Allegra and increase your fluid intake. (Patients taking Tikosyn should avoid Benadryl, and may take Zyrtec, Claritin, or Allegra) If you experience trouble breathing, this can be serious. If it is severe call [phone] IMMEDIATELY. If it is mild, please call our office.  We will call to schedule your test 2-4 weeks out understanding that some insurance companies will need an authorization prior to the service being performed.   For more information and frequently asked questions, please visit our website : [email or web address]  For non-scheduling related questions, please contact the cardiac imaging nurse navigator should you have any questions/concerns: Cardiac Imaging Nurse Navigators Direct Office Dial: [phone]   For scheduling needs, including cancellations and rescheduling, please call [place], [phone].   Follow-Up: At [facility], you and your health needs are our priority.  As part of our continuing mission to provide you with exceptional heart care, we have created designated Provider Care Teams.  These Care Teams include your primary Cardiologist (physician) and Advanced Practice Providers (APPs -  Physician Assistants and Nurse Practitioners) who all work together to provide you with the care you need, when you need it.  You will need a follow up appointment in 12 months  Providers on your designated Care Team:  Nicolasa Ducking, NP Eula Listen, PA-C Cadence Fransico Michael, PA-C  COVID-19 Vaccine Information can be found at: [email or web address] For questions related to vaccine distribution or  appointments, please email [email or web address] or call [phone].

## 2023-12-10 ENCOUNTER — Telehealth: Payer: Self-pay | Admitting: Cardiovascular Disease

## 2023-12-10 MED ORDER — AMLODIPINE BESYLATE 5 MG PO TABS: 5.0000 mg | ORAL_TABLET | Freq: Every day | ORAL | 3 refills | Status: DC

## 2023-12-10 NOTE — Telephone Encounter (Signed)
Requested Prescriptions   Signed Prescriptions Disp Refills  . amLODipine (NORVASC) 5 MG tablet 90 tablet 3    Sig: Take 1 tablet (5 mg total) by mouth daily.    Authorizing Provider: GOLLAN, TIMOTHY J    Ordering User: Koa Zoeller C    

## 2023-12-10 NOTE — Telephone Encounter (Signed)
*  STAT* If patient is at the pharmacy, call can be transferred to refill team.   1. Which medications need to be refilled? (please list name of each medication and dose if known) amLODipine (NORVASC) 5 MG tablet    2. Would you like to learn more about the convenience, safety, & potential cost savings by using the [facility]?      3. Are you open to using the [facility] (Type [facility].  ).   4. Which pharmacy/location (including street and city if local pharmacy) is medication to be sent to?  [id number] - [place], [place] - [address]     5. Do they need a 30 day or 90 day supply? 90 day

## 2023-12-11 ENCOUNTER — Telehealth (HOSPITAL_COMMUNITY): Payer: Self-pay | Admitting: *Deleted

## 2023-12-11 NOTE — Telephone Encounter (Signed)
 Reaching out to patient to offer assistance regarding upcoming cardiac imaging study; pt verbalizes understanding of appt date/time, parking situation and where to check in, pre-test NPO status and medications ordered, and verified current allergies; name and call back number provided for further questions should they arise  Larey Brick RN Navigator Cardiac Imaging [facility] [phone] office [phone] cell  Patient to take 50mg  metoprolol tartrate two hours prior to his cardiac CT scan if HR is greater than 65 BPM.

## 2023-12-13 ENCOUNTER — Ambulatory Visit
Admission: RE | Admit: 2023-12-13 | Discharge: 2023-12-13 | Disposition: A | Discharge status: Home / Self Care | Payer: Medicare Other | Source: Ambulatory Visit | Attending: Cardiovascular Disease | Admitting: Cardiovascular Disease

## 2023-12-13 DIAGNOSIS — R072 Precordial pain: Secondary | ICD-10-CM | POA: Insufficient documentation

## 2023-12-13 MED ORDER — METOPROLOL TARTRATE 5 MG/5ML IV SOLN: 10.0000 mg | Freq: Once | INTRAVENOUS | Status: DC | PRN

## 2023-12-13 MED ORDER — NITROGLYCERIN 0.4 MG SL SUBL
0.8000 mg | SUBLINGUAL_TABLET | Freq: Once | SUBLINGUAL | Status: AC
Start: 2023-12-13 — End: 2023-12-13
  Administered 2023-12-13: 0.8 mg via SUBLINGUAL

## 2023-12-13 MED ORDER — SODIUM CHLORIDE 0.9 % IV SOLN: INTRAVENOUS | Status: DC

## 2023-12-13 MED ORDER — DILTIAZEM HCL 25 MG/5ML IV SOLN: 10.0000 mg | INTRAVENOUS | Status: DC | PRN

## 2023-12-13 MED ORDER — IOHEXOL 350 MG/ML SOLN
75.0000 mL | Freq: Once | INTRAVENOUS | Status: AC | PRN
  Administered 2023-12-13: 75 mL via INTRAVENOUS

## 2023-12-13 NOTE — Progress Notes (Signed)
 Patient tolerated procedure well. Ambulate w/o difficulty. Denies light headedness or being dizzy. Sitting up drinking water provided. Encouraged to drink extra water today and reasoning explained. Verbalized understanding. All questions answered. ABC intact. No further needs. Discharge from procedure area w/o issues.

## 2023-12-19 ENCOUNTER — Telehealth: Payer: Self-pay | Admitting: Cardiovascular Disease

## 2023-12-19 NOTE — Telephone Encounter (Signed)
Patient called for his CT results.  

## 2023-12-19 NOTE — Telephone Encounter (Signed)
 Called and spoke with patient. Patient asking his results from recent CT. Patient informed that we have the preliminary results but Dr. Mariah Milling has not had a chance to review the results. Informed patient that he would be notified once Dr. Mariah Milling has a chance to review results. Patient verbalizes understanding.

## 2023-12-21 ENCOUNTER — Encounter: Payer: Self-pay | Admitting: Cardiovascular Disease

## 2023-12-28 NOTE — Telephone Encounter (Signed)
 Called patient and notified him of the following from Dr. Mariah Milling.  Clear to work out No further testing needed We just need to keep cholesterol low with the Crestor Thx TGollan  Patient verbalizes understanding.

## 2024-03-05 ENCOUNTER — Inpatient Hospital Stay (HOSPITAL_BASED_OUTPATIENT_CLINIC_OR_DEPARTMENT_OTHER): Payer: Medicare Other | Admitting: Internal Medicine

## 2024-03-05 ENCOUNTER — Inpatient Hospital Stay: Payer: Medicare Other | Attending: Internal Medicine

## 2024-03-05 VITALS — BP 129/61 | HR 66 | Temp 98.0°F | Resp 16 | Ht 71.0 in | Wt 199.2 lb

## 2024-03-05 DIAGNOSIS — D696 Thrombocytopenia, unspecified: Secondary | ICD-10-CM | POA: Insufficient documentation

## 2024-03-05 DIAGNOSIS — D751 Secondary polycythemia: Secondary | ICD-10-CM | POA: Insufficient documentation

## 2024-03-05 DIAGNOSIS — C642 Malignant neoplasm of left kidney, except renal pelvis: Secondary | ICD-10-CM | POA: Insufficient documentation

## 2024-03-05 DIAGNOSIS — M25559 Pain in unspecified hip: Secondary | ICD-10-CM | POA: Insufficient documentation

## 2024-03-05 DIAGNOSIS — N2889 Other specified disorders of kidney and ureter: Secondary | ICD-10-CM

## 2024-03-05 LAB — LACTATE DEHYDROGENASE: LDH: 151 U/L (ref 98–192)

## 2024-03-05 LAB — CBC WITH DIFFERENTIAL (CANCER CENTER ONLY)
Abs Immature Granulocytes: 0.02 10*3/uL (ref 0.00–0.07)
Basophils Absolute: 0.1 10*3/uL (ref 0.0–0.1)
Basophils Relative: 1 %
Eosinophils Absolute: 0.2 10*3/uL (ref 0.0–0.5)
Eosinophils Relative: 2 %
HCT: 49.5 % (ref 39.0–52.0)
Hemoglobin: 17 g/dL (ref 13.0–17.0)
Immature Granulocytes: 0 %
Lymphocytes Relative: 23 %
Lymphs Abs: 1.5 10*3/uL (ref 0.7–4.0)
MCH: 30.1 pg (ref 26.0–34.0)
MCHC: 34.3 g/dL (ref 30.0–36.0)
MCV: 87.6 fL (ref 80.0–100.0)
Monocytes Absolute: 0.6 10*3/uL (ref 0.1–1.0)
Monocytes Relative: 9 %
Neutro Abs: 4.3 10*3/uL (ref 1.7–7.7)
Neutrophils Relative %: 65 %
Platelet Count: 122 10*3/uL — ABNORMAL LOW (ref 150–400)
RBC: 5.65 MIL/uL (ref 4.22–5.81)
RDW: 13.2 % (ref 11.5–15.5)
WBC Count: 6.6 10*3/uL (ref 4.0–10.5)
nRBC: 0 % (ref 0.0–0.2)

## 2024-03-05 LAB — CMP (CANCER CENTER ONLY)
ALT: 26 U/L (ref 0–44)
AST: 24 U/L (ref 15–41)
Albumin: 4.1 g/dL (ref 3.5–5.0)
Alkaline Phosphatase: 60 U/L (ref 38–126)
Anion gap: 2 — ABNORMAL LOW (ref 5–15)
BUN: 18 mg/dL (ref 8–23)
CO2: 32 mmol/L (ref 22–32)
Calcium: 9.5 mg/dL (ref 8.9–10.3)
Chloride: 106 mmol/L (ref 98–111)
Creatinine: 1.42 mg/dL — ABNORMAL HIGH (ref 0.61–1.24)
GFR, Estimated: 53 mL/min — ABNORMAL LOW (ref >60)
Glucose, Bld: 161 mg/dL — ABNORMAL HIGH (ref 70–99)
Potassium: 4.2 mmol/L (ref 3.5–5.1)
Sodium: 140 mmol/L (ref 135–145)
Total Bilirubin: 1 mg/dL (ref 0.0–1.2)
Total Protein: 6 g/dL — ABNORMAL LOW (ref 6.5–8.1)

## 2024-03-05 NOTE — Progress Notes (Signed)
 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 He had a bone marrow biopsy and aspirate performed recently that showed no concerning findings for MDS, MPN or acute leukemia.  PRIOR THERAPY: None  CURRENT THERAPY: None  INTERVAL HISTORY: Duane Warren 68 y.o. male returns to the clinic today for follow-up visit. Discussed the use of AI scribe software for clinical note transcription with the patient, who gave verbal consent to proceed.  History of Present Illness   Duane Warren "Lavonia Powers" is a 68 year old male with a suspicious myeloproliferative neoplasm who presents for evaluation and repeat blood work.  He has a history of a suspicious myeloproliferative neoplasm characterized by polycythemia and thrombocytopenia. A JAK2 mutation panel revealed an uncommon mutation, DNMT3A. Previous bone marrow biopsy and aspirate showed concerning findings. He is here for evaluation and repeat blood work to monitor his condition.  Recently, a CT scan of the abdomen revealed a large left renal mass measuring 8.4 by 9.2 cm, arising from the anterior aspect of the left kidney, and a smaller 8 mm lesion in the right upper pole of the kidney. These findings were initially prompted by an MRI ordered by his orthopedic doctor. He denies any blood in his urine or pain in the left kidney area, although he mentions experiencing a 'real bad cramp' on the left flank area twice, which he managed by stretching.  He reports shoulder  and hip pain, for which he is attending therapy, and has been told it is due to 'cartilage on cartilage'. He is currently awaiting further tests before the surgery and has not yet been given a date for the procedure. He is scheduled to have blood work done today to check his blood count.       MEDICAL HISTORY: Past Medical History:  Diagnosis Date   Agatston coronary artery calcium  score greater than 400    a. 12/2015 Cardiac CT: Ca2+ score of 726 (91st %'ile).   Anxiety    Apical variant hypertrophic cardiomyopathy (HCC)    a. 01/2016 Echo: EF 55-60%, no rwma, mild AI, Ao root 39mm, mild MR, mod dil LA, mod TR, nl PASP; b. 12/2016 Cardiac MRI: EF 64%, sev apical hypertrophy up to 22mm in diastole. LGE in apical inf, lateral, and apical walls; c. 01/2018 Echo: EF 50-55%, gr2 DD. Mod-Sev apical hypertrophy. Mild AI. Mildly dil Ao root (3.8cm) and asc Ao (3.9cm). Sev dil LA. Nl RV fxn.   Dilated aortic root (HCC)    a. 01/2016 Ao root 39mm; b. 12/2016 Cardiac MRI: Mild aneurysmal dil of Asc Ao - 42mm; c. 01/2018 Echo: dil Ao root (3.8cm) and asc Ao (3.9cm).   Hyperaldosteronism (HCC)    Hypercholesteremia    Hypertension    Syncope    a. s/p MDT Linq.   Tremor     ALLERGIES:  is allergic to molds & smuts.  MEDICATIONS:  Current Outpatient Medications  Medication Sig Dispense Refill   allopurinol (ZYLOPRIM) 100 MG tablet Take 100 mg by mouth daily.  amLODipine  (NORVASC ) 5 MG tablet Take 1 tablet (5 mg total) by mouth daily. 90 tablet 3   azelastine  (OPTIVAR ) 0.05 % ophthalmic solution Place 1 drop into both eyes 2 (two) times daily as needed (itchy/watery eyes). 6 mL 3   busPIRone (BUSPAR) 5 MG tablet Take 5 mg by mouth 2 (two) times daily.     Cholecalciferol (VITAMIN D ) 2000 units tablet Take 4,000 Units by mouth daily.      colchicine  0.6 MG tablet Take 0.6 mg by mouth as needed.     ezetimibe  (ZETIA ) 10 MG tablet Take 1 tablet (10 mg total) by mouth daily. 90 tablet 3   Fluticasone   Propionate (XHANCE ) 93 MCG/ACT EXHU 1-2 sprays per nostril twice a day. 32 mL 5   levalbuterol  (XOPENEX  HFA) 45 MCG/ACT inhaler Inhale 2 puffs into the lungs every 4 (four) hours as needed for wheezing or shortness of breath (coughing fits). 1 each 2   metoprolol  tartrate (LOPRESSOR ) 50 MG tablet Take 1 tablet (50 mg total) by mouth once for 1 dose. 1 tablet 0   propranolol  (INDERAL ) 20 MG tablet TAKE 1 TABLET BY MOUTH 3 TIMES DAILY AS NEEDED. 270 tablet 1   rosuvastatin  (CRESTOR ) 5 MG tablet Take 1 tablet (5 mg total) by mouth daily. 90 tablet 3   sildenafil (VIAGRA) 100 MG tablet Take 50-100 mg by mouth daily as needed.     spironolactone  (ALDACTONE ) 25 MG tablet Take 1 tablet (25 mg total) by mouth daily. 90 tablet 3   tamsulosin (FLOMAX) 0.4 MG CAPS capsule Take 0.4 mg by mouth at bedtime.     valsartan  (DIOVAN ) 160 MG tablet Take 1 tablet (160 mg total) by mouth 2 (two) times daily. 180 tablet 3   No current facility-administered medications for this visit.    SURGICAL HISTORY:  Past Surgical History:  Procedure Laterality Date   HERNIA REPAIR     KNEE SURGERY     unknown which side   LOOP RECORDER INSERTION N/A 04/30/2017   Procedure: Loop Recorder Insertion;  Surgeon: Verona Goodwill, MD;  Location: [facility] INVASIVE CV LAB;  Service: Cardiovascular;  Laterality: N/A;   SKIN GRAFT      REVIEW OF SYSTEMS:  Constitutional: positive for fatigue Eyes: negative Ears, nose, mouth, throat, and face: negative Respiratory: negative Cardiovascular: negative Gastrointestinal: negative Genitourinary:negative Integument/breast: negative Hematologic/lymphatic: negative Musculoskeletal:positive for arthralgias Neurological: negative Behavioral/Psych: negative Endocrine: negative Allergic/Immunologic: negative   PHYSICAL EXAMINATION: General appearance: alert, cooperative, and no distress Head: Normocephalic, without obvious abnormality, atraumatic Neck: no adenopathy, no JVD, supple,  symmetrical, trachea midline, and thyroid not enlarged, symmetric, no tenderness/mass/nodules Lymph nodes: Cervical, supraclavicular, and axillary nodes normal. Resp: clear to auscultation bilaterally Back: symmetric, no curvature. ROM normal. No CVA tenderness. Cardio: regular rate and rhythm, S1, S2 normal, no murmur, click, rub or gallop GI: soft, non-tender; bowel sounds normal; no masses,  no organomegaly Extremities: extremities normal, atraumatic, no cyanosis or edema Neurologic: Alert and oriented X 3, normal strength and tone. Normal symmetric reflexes. Normal coordination and gait  ECOG PERFORMANCE STATUS: 1 - Symptomatic but completely ambulatory  Blood pressure 129/61, pulse 66, temperature 98 F (36.7 C), temperature source Temporal, resp. rate 16, height 5\' 11"  (1.803 m), weight 199 lb 3.2 oz (90.4 kg), SpO2 98%.  LABORATORY DATA: Lab Results  Component Value Date   WBC 9.8 10/19/2023   HGB 16.6 10/19/2023   HCT 49.8 10/19/2023   MCV 89.7 10/19/2023   PLT 131 (L) 10/19/2023  Chemistry      Component Value Date/Time   NA 142 09/06/2023 0854   NA 145 (H) 04/20/2020 1256   K 4.3 09/06/2023 0854   CL 108 09/06/2023 0854   CO2 31 09/06/2023 0854   BUN 17 09/06/2023 0854   BUN 17 04/20/2020 1256   CREATININE 1.22 09/06/2023 0854      Component Value Date/Time   CALCIUM  9.8 09/06/2023 0854   ALKPHOS 60 09/06/2023 0854   AST 22 09/06/2023 0854   ALT 21 09/06/2023 0854   BILITOT 1.0 09/06/2023 0854       RADIOGRAPHIC STUDIES: No results found.  ASSESSMENT AND PLAN: This is a very pleasant 68 years old African-American male presented for evaluation of polycythemia as well as thrombocytopenia suspicious for myeloproliferative neoplasm with a rare DNMT3A p. 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  He had a bone marrow biopsy and aspirate performed recently that showed no concerning findings  for MDS, MPN or acute leukemia.     Renal cell carcinoma Large left renal mass measuring 8.4 x 9.2 cm and a smaller 8 mm lesion in the right upper pole of the kidney. CT scan findings are concerning for renal cell carcinoma, especially on the left side. Evaluated by urologist Dr. Freddi Jaeger, who plans to remove the left kidney. Post-surgical evaluation for potential adjuvant immunotherapy, especially if stage 3 or higher, is planned. Chest CT needed to check for metastasis. - Coordinate with Dr. Freddi Jaeger for surgical removal of the left kidney - Evaluate for adjuvant immunotherapy post-surgery - Order chest CT to check for metastasis  Suspicious myeloproliferative neoplasm Suspicious myeloproliferative neoplasm with polycythemia and thrombocytopenia. JAK2 mutation panel positive for uncommon mutation, DNMT3A. Previous bone marrow biopsy and aspirate showed concerning findings. Blood work needed to assess current status. 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 Undergoing therapy for this condition.  Follow-up Follow-up planned post-surgery to evaluate further treatment options, including potential immunotherapy. - Schedule follow-up appointment 2-3 weeks post-surgery with hematology/oncology   The patient was advised to call immediately if he has any concerning symptoms in the interval.  The patient voices understanding of current disease status and treatment options and is in agreement with the current care plan.  All questions were answered. The patient knows to call the clinic with any problems, questions or concerns. We can certainly see the patient much sooner if necessary.  The total time spent in the appointment was 30 minutes.  Disclaimer: This note was dictated with voice recognition software. Similar sounding words can inadvertently be transcribed and may not be corrected upon review.

## 2024-03-13 ENCOUNTER — Telehealth: Payer: Self-pay | Admitting: Cardiovascular Disease

## 2024-03-13 ENCOUNTER — Telehealth: Payer: Self-pay | Admitting: *Deleted

## 2024-03-13 ENCOUNTER — Other Ambulatory Visit: Payer: Self-pay | Admitting: Urology

## 2024-03-13 NOTE — Telephone Encounter (Signed)
 Pt has been scheduled tele preop appt 05/16/24. Med rec and consent are done.     Patient Consent for Virtual Visit        ABDIKADIR FOHL has provided verbal consent on 03/13/2024 for a virtual visit (video or telephone).   CONSENT FOR VIRTUAL VISIT FOR:  Anthonette Kinsman  By participating in this virtual visit I agree to the following:  I hereby voluntarily request, consent and authorize [facility] and its employed or contracted physicians, physician assistants, nurse practitioners or other licensed health care professionals (the Practitioner), to provide me with telemedicine health care services (the "Services") as deemed necessary by the treating Practitioner. I acknowledge and consent to receive the Services by the Practitioner via telemedicine. I understand that the telemedicine visit will involve communicating with the Practitioner through live audiovisual communication technology and the disclosure of certain medical information by electronic transmission. I acknowledge that I have been given the opportunity to request an in-person assessment or other available alternative prior to the telemedicine visit and am voluntarily participating in the telemedicine visit.  I understand that I have the right to withhold or withdraw my consent to the use of telemedicine in the course of my care at any time, without affecting my right to future care or treatment, and that the Practitioner or I may terminate the telemedicine visit at any time. I understand that I have the right to inspect all information obtained and/or recorded in the course of the telemedicine visit and may receive copies of available information for a reasonable fee.  I understand that some of the potential risks of receiving the Services via telemedicine include:  Delay or interruption in medical evaluation due to technological equipment failure or disruption; Information transmitted may not be sufficient (e.g. poor  resolution of images) to allow for appropriate medical decision making by the Practitioner; and/or  In rare instances, security protocols could fail, causing a breach of personal health information.  Furthermore, I acknowledge that it is my responsibility to provide information about my medical history, conditions and care that is complete and accurate to the best of my ability. I acknowledge that Practitioner's advice, recommendations, and/or decision may be based on factors not within their control, such as incomplete or inaccurate data provided by me or distortions of diagnostic images or specimens that may result from electronic transmissions. I understand that the practice of medicine is not an exact science and that Practitioner makes no warranties or guarantees regarding treatment outcomes. I acknowledge that a copy of this consent can be made available to me via my patient portal [facility] MyChart), or I can request a printed copy by calling the office of  [facility].    I understand that my insurance will be billed for this visit.   I have read or had this consent read to me. I understand the contents of this consent, which adequately explains the benefits and risks of the Services being provided via telemedicine.  I have been provided ample opportunity to ask questions regarding this consent and the Services and have had my questions answered to my satisfaction. I give my informed consent for the services to be provided through the use of telemedicine in my medical care

## 2024-03-13 NOTE — Telephone Encounter (Signed)
   Pre-operative Risk Assessment    Patient Name: Duane Warren  DOB: 1956/08/16 MRN: [id number]   Date of last office visit: 12/03/23 Date of next office visit: n/a   Request for Surgical Clearance    Procedure: robotic assisted laparoscopic left radical nephrectomy  Date of Surgery:  Clearance 06/06/24                                Surgeon:  Dr. Sharri Dee Group or Practice Name:  [facility] Phone number:  [phone] Fax number:  [phone]   Type of Clearance Requested:   - Medical    Type of Anesthesia:  General    Additional requests/questions:     SignedCaryn Clause   03/13/2024, 12:07 PM

## 2024-03-13 NOTE — Telephone Encounter (Signed)
 Pt has been scheduled tele preop appt 05/16/24. Med rec and consent are done.

## 2024-03-13 NOTE — Telephone Encounter (Addendum)
   Name: Duane Warren  DOB: 03/25/1956  MRN: [id number]  Primary Cardiologist: Richardo Chandler, MD   Preoperative team, please contact this patient and set up a phone call appointment for further preoperative risk assessment. Please obtain consent and complete medication review. Thank you for your help.  I confirm that guidance regarding antiplatelet and oral anticoagulation therapy has been completed and, if necessary, noted below.  I also confirmed the patient resides in the state of [place] . As per [facility] Medical Board telemedicine laws, the patient must reside in the state in which the provider is licensed.   Jude Norton, NP 03/13/2024, 1:02 PM [facility]

## 2024-03-14 ENCOUNTER — Other Ambulatory Visit (HOSPITAL_COMMUNITY): Payer: Self-pay | Admitting: Urology

## 2024-03-14 DIAGNOSIS — D49512 Neoplasm of unspecified behavior of left kidney: Secondary | ICD-10-CM

## 2024-03-14 DIAGNOSIS — D49511 Neoplasm of unspecified behavior of right kidney: Secondary | ICD-10-CM

## 2024-03-19 ENCOUNTER — Other Ambulatory Visit: Payer: Self-pay

## 2024-03-19 ENCOUNTER — Ambulatory Visit (INDEPENDENT_AMBULATORY_CARE_PROVIDER_SITE_OTHER): Payer: No Typology Code available for payment source | Admitting: Allergy

## 2024-03-19 ENCOUNTER — Encounter: Payer: Self-pay | Admitting: Allergy

## 2024-03-19 VITALS — BP 104/68 | HR 58 | Temp 98.3°F | Ht 71.0 in | Wt 193.0 lb

## 2024-03-19 DIAGNOSIS — J3089 Other allergic rhinitis: Secondary | ICD-10-CM

## 2024-03-19 DIAGNOSIS — R0602 Shortness of breath: Secondary | ICD-10-CM

## 2024-03-19 MED ORDER — FLUTICASONE PROPIONATE 50 MCG/ACT NA SUSP: 1.0000 | Freq: Every day | NASAL | 5 refills | Status: AC | PRN

## 2024-03-19 NOTE — Progress Notes (Signed)
 Follow Up Note  RE: Duane Warren MRN: [id number] DOB: Aug 24, 1956 Date of Office Visit: 03/19/2024  Referring provider: Pete Brand, DO Primary care provider: Pete Brand, DO  Chief Complaint: Allergic Rhinitis , Asthma, and Follow-up (Shortness of breath when bending and laying down)  History of Present Illness: I had the pleasure of seeing Duane Warren for a follow up visit at the [facility] on 03/19/2024. He is a 68 y.o. male, who is being followed for allergic rhinoconjunctivitis and shortness of breath. His previous allergy  office visit was on 09/24/2023 with Dr. Burdette Carolin. Today is a regular follow up visit.  Discussed the use of AI scribe software for clinical note transcription with the patient, who gave verbal consent to proceed.    He experiences intermittent nasal congestion, describing episodes of feeling 'stopped up' that he can relieve by blowing his nose. He uses a nasal spray, Flonase , two sprays per nostril as needed, but very seldom. No epistaxis is reported, and he is not currently taking any allergy  medications.  He experiences shortness of breath, particularly when climbing stairs, bending over, and sometimes while lying in bed. He has a history of heart disease, specifically an enlarged heart, and is under the care of a cardiologist. He uses an inhaler as needed but has not used it in over a month and finds it difficult to determine its effectiveness. No recent fevers or chills are reported, and he maintains normal eating and bathroom habits.  He is scheduled for surgery due to a mass on his left kidney, which was incidentally found by his orthopedic doctor.      Assessment and Plan: [name] is a 68 y.o. male with: Other allergic rhinitis Past history - 2021 skin testing positive to perennial mold. Xhance  not covered. Interim history - occasional Flonase  use. Use Flonase  (fluticasone ) nasal spray 1-2 sprays per nostril once a day as  needed for nasal congestion.  Nasal saline spray (i.e., Simply Saline) or nasal saline lavage (i.e., NeilMed) is recommended as needed and prior to medicated nasal sprays. Use over the counter antihistamines such as Zyrtec (cetirizine), Claritin (loratadine), Allegra (fexofenadine), or Xyzal (levocetirizine) daily as needed. May take twice a day during allergy  flares. May switch antihistamines every few months.   Shortness of breath Past history - Some shortness of breath at times. 2021 spirometry showed: possible restrictive disease with 8% improvement in FEV1 post bronchodilator treatment and feeling better. Interim history - mostly exertion related and has CAD. Rescue inhaler ineffective.  Unlikely to be lung/asthma related. Please follow up with your PCP and cardiologist regarding this.   Return in about 1 year (around 03/19/2025).  Meds ordered this encounter  Medications   fluticasone  (FLONASE ) 50 MCG/ACT nasal spray    Sig: Place 1-2 sprays into both nostrils daily as needed (nasal congestion).    Dispense:  16 g    Refill:  5   Lab Orders  No laboratory test(s) ordered today    Diagnostics: None.   Medication List:  Current Outpatient Medications  Medication Sig Dispense Refill   allopurinol (ZYLOPRIM) 100 MG tablet Take 100 mg by mouth daily.     amLODipine  (NORVASC ) 5 MG tablet Take 1 tablet (5 mg total) by mouth daily. 90 tablet 3   Cholecalciferol (VITAMIN D ) 2000 units tablet Take 2,000 Units by mouth daily.     colchicine  0.6 MG tablet Take 0.6 mg by mouth as needed.     ezetimibe  (ZETIA ) 10 MG tablet  Take 1 tablet (10 mg total) by mouth daily. 90 tablet 3   fluticasone  (FLONASE ) 50 MCG/ACT nasal spray Place 1-2 sprays into both nostrils daily as needed (nasal congestion). 16 g 5   propranolol  (INDERAL ) 20 MG tablet TAKE 1 TABLET BY MOUTH 3 TIMES DAILY AS NEEDED. 270 tablet 1   rosuvastatin  (CRESTOR ) 5 MG tablet Take 1 tablet (5 mg total) by mouth daily. 90 tablet 3    sildenafil (VIAGRA) 100 MG tablet Take 50-100 mg by mouth daily as needed.     spironolactone  (ALDACTONE ) 25 MG tablet Take 1 tablet (25 mg total) by mouth daily. 90 tablet 3   tamsulosin (FLOMAX) 0.4 MG CAPS capsule Take 0.4 mg by mouth at bedtime.     valsartan  (DIOVAN ) 160 MG tablet Take 1 tablet (160 mg total) by mouth 2 (two) times daily. 180 tablet 3   busPIRone (BUSPAR) 5 MG tablet Take 5 mg by mouth 2 (two) times daily. (Patient not taking: Reported on 03/19/2024)     metoprolol  tartrate (LOPRESSOR ) 50 MG tablet Take 1 tablet (50 mg total) by mouth once for 1 dose. 1 tablet 0   No current facility-administered medications for this visit.   Allergies: Allergies  Allergen Reactions   Molds & Smuts Other (See Comments)   I reviewed his past medical history, social history, family history, and environmental history and no significant changes have been reported from his previous visit.  Review of Systems  Constitutional:  Negative for appetite change, chills, fever and unexpected weight change.  HENT:  Negative for congestion and rhinorrhea.   Eyes:  Negative for itching.  Respiratory:  Positive for shortness of breath. Negative for cough, chest tightness and wheezing.   Cardiovascular:  Negative for chest pain.  Gastrointestinal:  Negative for abdominal pain.  Genitourinary:  Negative for difficulty urinating.  Skin:  Negative for rash.  Allergic/Immunologic: Positive for environmental allergies.  Neurological:  Negative for headaches.    Objective: BP 104/68 (BP Location: Left Arm, Patient Position: Sitting, Cuff Size: Normal)   Pulse (!) 58   Temp 98.3 F (36.8 C) (Temporal)   Ht 5\' 11"  (1.803 m)   Wt 193 lb (87.5 kg)   SpO2 96%   BMI 26.92 kg/m  Body mass index is 26.92 kg/m. Physical Exam Vitals and nursing note reviewed.  Constitutional:      Appearance: Normal appearance. He is well-developed. He is obese.  HENT:     Head: Normocephalic and atraumatic.     Right  Ear: Tympanic membrane and external ear normal.     Left Ear: Tympanic membrane and external ear normal.     Nose: Nose normal.     Mouth/Throat:     Mouth: Mucous membranes are moist.     Pharynx: Oropharynx is clear.  Eyes:     Conjunctiva/sclera: Conjunctivae normal.  Cardiovascular:     Rate and Rhythm: Normal rate and regular rhythm.     Heart sounds: Normal heart sounds. No murmur heard.    No friction rub. No gallop.  Pulmonary:     Effort: Pulmonary effort is normal.     Breath sounds: Normal breath sounds. No wheezing, rhonchi or rales.  Musculoskeletal:     Cervical back: Neck supple.  Skin:    General: Skin is warm.     Findings: No rash.  Neurological:     Mental Status: He is alert and oriented to person, place, and time.  Psychiatric:  Behavior: Behavior normal.   Previous notes and tests were reviewed. The plan was reviewed with the patient/family, and all questions/concerned were addressed.  It was my pleasure to see Read today and participate in his care. Please feel free to contact me with any questions or concerns.  Sincerely,  Eudelia Hero, DO Allergy  & Immunology  [facility]  [facility] office: [phone] [facility] office: [phone]

## 2024-03-19 NOTE — Patient Instructions (Addendum)
 BRING ALL OF YOUR MEDICATIONS TO YOUR NEXT VISIT.   Environmental allergies 2021 skin testing positive to perennial mold. Use Flonase  (fluticasone ) nasal spray 1-2 sprays per nostril once a day as needed for nasal congestion.  Nasal saline spray (i.e., Simply Saline) or nasal saline lavage (i.e., NeilMed) is recommended as needed and prior to medicated nasal sprays.  Use over the counter antihistamines such as Zyrtec (cetirizine), Claritin (loratadine), Allegra (fexofenadine), or Xyzal (levocetirizine) daily as needed. May take twice a day during allergy  flares. May switch antihistamines every few months.  Shortness of breath Unlikely to be lung/asthma related. Please follow up with your PCP and cardiologist regarding this.   Return in about 1 year (around 03/19/2025). Or sooner if needed.

## 2024-03-26 ENCOUNTER — Other Ambulatory Visit: Payer: Self-pay | Admitting: Urology

## 2024-03-26 NOTE — Telephone Encounter (Signed)
 Left message to call back to move up his tele preop appt as surgeon office called with an update procedure date moved up to 05/02/24.

## 2024-03-26 NOTE — Telephone Encounter (Signed)
 Office called back to say the Surgeon has been change to Dr. Sherrine Dolly and the date of surgery has been change to 7/18. Pre opp clearance needs to be moved up. Please advise

## 2024-03-27 ENCOUNTER — Other Ambulatory Visit: Payer: Self-pay | Admitting: Urology

## 2024-03-27 DIAGNOSIS — D49512 Neoplasm of unspecified behavior of left kidney: Secondary | ICD-10-CM

## 2024-03-27 DIAGNOSIS — H532 Diplopia: Secondary | ICD-10-CM

## 2024-03-28 ENCOUNTER — Ambulatory Visit (HOSPITAL_COMMUNITY)

## 2024-03-28 ENCOUNTER — Encounter (HOSPITAL_COMMUNITY): Payer: Self-pay

## 2024-03-28 ENCOUNTER — Other Ambulatory Visit: Payer: Self-pay | Admitting: Cardiovascular Disease

## 2024-03-28 DIAGNOSIS — I6523 Occlusion and stenosis of bilateral carotid arteries: Secondary | ICD-10-CM

## 2024-03-28 DIAGNOSIS — E785 Hyperlipidemia, unspecified: Secondary | ICD-10-CM

## 2024-03-28 DIAGNOSIS — I2510 Atherosclerotic heart disease of native coronary artery without angina pectoris: Secondary | ICD-10-CM

## 2024-04-04 ENCOUNTER — Ambulatory Visit
Admission: RE | Admit: 2024-04-04 | Discharge: 2024-04-04 | Disposition: A | Discharge status: Home / Self Care | Source: Ambulatory Visit | Attending: Urology | Admitting: Urology

## 2024-04-04 DIAGNOSIS — D49512 Neoplasm of unspecified behavior of left kidney: Secondary | ICD-10-CM

## 2024-04-04 DIAGNOSIS — H532 Diplopia: Secondary | ICD-10-CM

## 2024-04-04 MED ORDER — GADOPICLENOL 0.5 MMOL/ML IV SOLN
10.0000 mL | Freq: Once | INTRAVENOUS | Status: AC | PRN
  Administered 2024-04-04: 9 mL via INTRAVENOUS

## 2024-04-07 ENCOUNTER — Ambulatory Visit: Attending: Internal Medicine

## 2024-04-07 ENCOUNTER — Other Ambulatory Visit: Payer: Self-pay | Admitting: Orthopedic Surgery

## 2024-04-07 ENCOUNTER — Inpatient Hospital Stay: Attending: Internal Medicine | Admitting: Internal Medicine

## 2024-04-07 VITALS — BP 135/74 | Temp 98.5°F | Resp 18 | Ht 71.0 in | Wt 191.2 lb

## 2024-04-07 DIAGNOSIS — M542 Cervicalgia: Secondary | ICD-10-CM

## 2024-04-07 DIAGNOSIS — Z01810 Encounter for preprocedural cardiovascular examination: Secondary | ICD-10-CM | POA: Diagnosis not present

## 2024-04-07 DIAGNOSIS — D696 Thrombocytopenia, unspecified: Secondary | ICD-10-CM | POA: Insufficient documentation

## 2024-04-07 DIAGNOSIS — R5382 Chronic fatigue, unspecified: Secondary | ICD-10-CM

## 2024-04-07 DIAGNOSIS — C642 Malignant neoplasm of left kidney, except renal pelvis: Secondary | ICD-10-CM | POA: Insufficient documentation

## 2024-04-07 DIAGNOSIS — D751 Secondary polycythemia: Secondary | ICD-10-CM | POA: Insufficient documentation

## 2024-04-07 DIAGNOSIS — N2889 Other specified disorders of kidney and ureter: Secondary | ICD-10-CM

## 2024-04-07 NOTE — Progress Notes (Signed)
 [facility] [phone]   [phone]  OFFICE PROGRESS NOTE  Duane Brunet, DO [address] 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 He had a bone marrow biopsy and aspirate performed recently that showed no concerning findings for MDS, MPN or acute leukemia.  PRIOR THERAPY: None  CURRENT THERAPY: Expected to have left nephrectomy on May 02, 2024.  INTERVAL HISTORY: Duane Warren 68 y.o. male returns to the clinic today for follow-up visit accompanied by his wife. Discussed the use of AI scribe software for clinical note transcription with the patient, who gave verbal consent to proceed.  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 He is accompanied by his wife.  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 Surgery is scheduled for May 02, 2024. Recent imaging, including a PET scan, revealed a kidney mass and a 5 mm nodule in the right lung, which is too small to be detected on the PET scan. A 9 mm spot was also noted in the right kidney. A CT scan showed a lytic lesion in the T4 vertebra, but this was not seen on the PET scan. He has not experienced hematuria but has had intermittent back pain, which he attributed to cramps.  He has a history of a suspicious myeloproliferative neoplasm with polycythemia and thrombocytopenia, with a negative JAK2  mutation panel except for DNMT3A. He has elevated red blood cells and low platelets, with the latter being mild thrombocytopenia at 122,000. His red blood cell count has normalized. He underwent a bone marrow biopsy to investigate these abnormalities, which revealed some abnormalities that require monitoring.  In 2016, he underwent adrenal gland surgery with sampling on both glands, primarily due to issues with the left gland. An MRI of the abdomen in 2012 did not show any kidney mass, indicating that the current mass developed after that time. He has had no significant imaging of the abdomen until the recent CT scan in May 2025.        MEDICAL HISTORY: Past Medical History:  Diagnosis Date   Agatston coronary artery calcium  score greater than 400    a. 12/2015 Cardiac CT: Ca2+ score of 726 (91st %'ile).   Anxiety    Apical variant hypertrophic cardiomyopathy (HCC)    a. 01/2016 Echo: EF 55-60%, no rwma, mild AI, Ao root 39mm, mild MR, mod dil LA, mod TR, nl PASP; b. 12/2016 Cardiac MRI: EF 64%, sev apical hypertrophy up to 22mm in diastole. LGE in apical inf, lateral, and apical walls; c. 01/2018 Echo: EF 50-55%, gr2 DD. Mod-Sev apical hypertrophy. Mild AI. Mildly dil Ao root (3.8cm) and asc Ao (3.9cm). Sev dil LA. Nl RV fxn.   Dilated aortic root (HCC)    a. 01/2016 Ao root 39mm; b. 12/2016 Cardiac MRI: Mild aneurysmal dil of Asc Ao - 42mm; c. 01/2018 Echo: dil Ao root (3.8cm) and asc Ao (3.9cm).  Hyperaldosteronism (HCC)    Hypercholesteremia    Hypertension    Syncope    a. s/p MDT Linq.   Tremor     ALLERGIES:  is allergic to molds & smuts.  MEDICATIONS:  Current Outpatient Medications  Medication Sig Dispense Refill   allopurinol (ZYLOPRIM) 100 MG tablet Take 100 mg by mouth daily.     amLODipine  (NORVASC ) 5 MG tablet Take 1 tablet (5 mg total) by mouth daily. 90 tablet 3   busPIRone (BUSPAR) 5 MG tablet Take 5 mg by mouth 2 (two) times daily. (Patient not taking: Reported on  03/19/2024)     Cholecalciferol (VITAMIN D ) 2000 units tablet Take 2,000 Units by mouth daily.     colchicine  0.6 MG tablet Take 0.6 mg by mouth as needed.     ezetimibe  (ZETIA ) 10 MG tablet TAKE 1 TABLET BY MOUTH EVERY DAY 90 tablet 3   fluticasone  (FLONASE ) 50 MCG/ACT nasal spray Place 1-2 sprays into both nostrils daily as needed (nasal congestion). 16 g 5   metoprolol  tartrate (LOPRESSOR ) 50 MG tablet Take 1 tablet (50 mg total) by mouth once for 1 dose. 1 tablet 0   propranolol  (INDERAL ) 20 MG tablet TAKE 1 TABLET BY MOUTH 3 TIMES DAILY AS NEEDED. 270 tablet 1   rosuvastatin  (CRESTOR ) 5 MG tablet TAKE 1 TABLET (5 MG TOTAL) BY MOUTH DAILY. 90 tablet 3   sildenafil (VIAGRA) 100 MG tablet Take 50-100 mg by mouth daily as needed.     spironolactone  (ALDACTONE ) 25 MG tablet Take 1 tablet (25 mg total) by mouth daily. 90 tablet 3   tamsulosin (FLOMAX) 0.4 MG CAPS capsule Take 0.4 mg by mouth at bedtime.     valsartan  (DIOVAN ) 160 MG tablet Take 1 tablet (160 mg total) by mouth 2 (two) times daily. 180 tablet 3   No current facility-administered medications for this visit.    SURGICAL HISTORY:  Past Surgical History:  Procedure Laterality Date   HERNIA REPAIR     KNEE SURGERY     unknown which side   LOOP RECORDER INSERTION N/A 04/30/2017   Procedure: Loop Recorder Insertion;  Surgeon: Fernande Elspeth BROCKS, MD;  Location: [facility] INVASIVE CV LAB;  Service: Cardiovascular;  Laterality: N/A;   SKIN GRAFT      REVIEW OF SYSTEMS:  Constitutional: positive for fatigue Eyes: negative Ears, nose, mouth, throat, and face: negative Respiratory: negative Cardiovascular: negative Gastrointestinal: negative Genitourinary:negative Integument/breast: negative Hematologic/lymphatic: negative Musculoskeletal:positive for arthralgias Neurological: negative Behavioral/Psych: negative Endocrine: negative Allergic/Immunologic: negative   PHYSICAL EXAMINATION: General appearance: alert, cooperative, and no  distress Head: Normocephalic, without obvious abnormality, atraumatic Neck: no adenopathy, no JVD, supple, symmetrical, trachea midline, and thyroid not enlarged, symmetric, no tenderness/mass/nodules Lymph nodes: Cervical, supraclavicular, and axillary nodes normal. Resp: clear to auscultation bilaterally Back: symmetric, no curvature. ROM normal. No CVA tenderness. Cardio: regular rate and rhythm, S1, S2 normal, no murmur, click, rub or gallop GI: soft, non-tender; bowel sounds normal; no masses,  no organomegaly Extremities: extremities normal, atraumatic, no cyanosis or edema Neurologic: Alert and oriented X 3, normal strength and tone. Normal symmetric reflexes. Normal coordination and gait  ECOG PERFORMANCE STATUS: 1 - Symptomatic but completely ambulatory  Blood pressure 135/74, temperature 98.5 F (36.9 C), temperature source Temporal, resp. rate 18, height 5' 11 (1.803 m), weight 191 lb 3.2 oz (86.7 kg), SpO2 99%.  LABORATORY DATA: Lab Results  Component Value Date   WBC 6.6 03/05/2024   HGB 17.0 03/05/2024   HCT 49.5 03/05/2024  MCV 87.6 03/05/2024   PLT 122 (L) 03/05/2024      Chemistry      Component Value Date/Time   NA 140 03/05/2024 1016   NA 145 (H) 04/20/2020 1256   K 4.2 03/05/2024 1016   CL 106 03/05/2024 1016   CO2 32 03/05/2024 1016   BUN 18 03/05/2024 1016   BUN 17 04/20/2020 1256   CREATININE 1.42 (H) 03/05/2024 1016      Component Value Date/Time   CALCIUM  9.5 03/05/2024 1016   ALKPHOS 60 03/05/2024 1016   AST 24 03/05/2024 1016   ALT 26 03/05/2024 1016   BILITOT 1.0 03/05/2024 1016       RADIOGRAPHIC STUDIES: MR ABDOMEN WWO CONTRAST Result Date: 04/04/2024 CLINICAL DATA:  Left renal neoplasm. EXAM: MRI ABDOMEN WITHOUT AND WITH CONTRAST TECHNIQUE: Multiplanar multisequence MR imaging of the abdomen was performed both before and after the administration of intravenous contrast. CONTRAST:  9 cc view COMPARISON:  03/27/2024 PET. FINDINGS: Lower  chest: Mild cardiomegaly, without pericardial or pleural effusion. Hepatobiliary: Scattered bilateral hepatic cysts. Normal gallbladder, without biliary ductal dilatation. Pancreas:  Normal, without mass or ductal dilatation. Spleen:  Normal in size, without focal abnormality. Adrenals/Urinary Tract: Mild left adrenal thickening. Normal right adrenal gland. Bilateral small nonenhancing renal lesions are likely cysts but technically too small to characterize. An exophytic medial upper pole right renal 6 mm lesion on 52/10 demonstrates enhancement, likely a small renal cell carcinoma. Again identified is right-sided hydronephrosis without hydroureter. Corresponding the PET abnormality, exophytic off the anterior interpolar left kidney, is an 8.5 x 8.3 x 8.9 cm heterogeneous mass (28/4 and 22/2). This demonstrates precontrast T2 hypointensity and T1 hyperintensity, consistent with hemorrhagic components. After contrast, areas of heterogeneous mild postcontrast enhancement are identified, including inferiorly (example subtracted image 78/15). These correspond to hypermetabolism on prior PET. Stomach/Bowel: Normal stomach and abdominal bowel loops. Vascular/Lymphatic: Aortic atherosclerosis. Patent renal veins. No retroperitoneal or retrocrural adenopathy. Other:  No ascites. Musculoskeletal: No acute osseous abnormality. IMPRESSION: 1. 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 2. No evidence of metastatic disease. 3. Upper pole right renal 6 mm lesion is likely a synchronous renal cell carcinoma. 4. Moderate right hydronephrosis without hydroureter, as on PET. Favor chronic ureteropelvic junction obstruction. 5.  Aortic Atherosclerosis (ICD10-I70.0). Electronically Signed   By: Rockey Kilts M.D.   On: 04/04/2024 12:27   MR BRAIN W WO CONTRAST Result Date: 04/04/2024 CLINICAL DATA:  Diplopia. EXAM: MRI HEAD WITHOUT AND WITH CONTRAST TECHNIQUE: Multiplanar, multiecho  pulse sequences of the brain and surrounding structures were obtained without and with intravenous contrast. CONTRAST:  9 mm Vueway . COMPARISON:  MRI brain 05/08/2020. FINDINGS: Brain: No acute infarct or hemorrhage. 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 Chronic microhemorrhage in the right cerebellar hemisphere. No hydrocephalus or extra-axial collection. No mass or abnormal enhancement. Vascular: Normal flow voids and vessel enhancement. Skull and upper cervical spine: Normal marrow signal and enhancement. Sinuses/Orbits: No acute findings. Other: None. IMPRESSION: 1. No acute intracranial abnormality or mass. 2. 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 Electronically Signed   By: Ryan Chess M.D.   On: 04/04/2024 11:01    ASSESSMENT AND PLAN: This is a very pleasant 68 years old African-American male with: 1) highly suspicious left renal cell carcinoma.  2) history of polycythemia as well as thrombocytopenia suspicious for myeloproliferative neoplasm with  a rare DNMT3A p. 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  He had a bone marrow biopsy and aspirate performed recently that showed no concerning findings for MDS, MPN or acute leukemia.    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 The mass is large, suggesting a fast-growing tumor. No metastasis detected on PET scan, but a 5 mm nodule in the right lung and a lytic lesion in T4 vertebra were noted on CT scan. These findings are below the detection limit of the PET scan and require monitoring. Surgery is planned for May 02, 2024, to remove the mass. Post-surgery, the stage of the cancer will be determined, and further treatment, likely immune therapy,  will be considered based on the pathology report. Immune therapy such as Keytruda may be effective for stage 3 and stage 4 disease, particularly if the nodule in the lung or the bone lesion are malignant. - Proceed with surgery on May 02, 2024, to remove the left kidney mass. - Monitor the 5 mm nodule in the right lung and the lytic lesion in T4 vertebra. - Evaluate the pathology report post-surgery to determine the stage and need for additional treatment, likely immune therapy such as Keytruda. - Schedule follow-up appointment 2-3 weeks post-surgery to discuss pathology results and further treatment plan.  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 JAK2 mutation panel negative except for DNMT3A. The condition is under evaluation, and he has undergone a bone marrow biopsy to investigate the cause of the blood abnormalities. The elevated red blood cells have normalized. - Continue monitoring blood counts and bone marrow findings.  Thrombocytopenia Mild thrombocytopenia with platelet count at 122,000, which is adequate for surgery. The bone marrow biopsy did not reveal any significant abnormalities requiring intervention. The thrombocytopenia is mild and not attributed to medications at this time. - Monitor platelet count and ensure it remains above 100,000 for surgical safety.   The patient was advised to call immediately if she has any concerning symptoms in the interval. The patient voices understanding of current disease status and treatment options and is in agreement with the current care plan.  All questions were answered. The patient knows to call the clinic with any problems, questions or concerns. We can certainly see the patient much sooner if necessary.  The total time spent in the appointment was 55 minutes.  Disclaimer: This note was dictated with voice recognition software. Similar sounding  words can inadvertently be transcribed and may not be corrected upon review.

## 2024-04-07 NOTE — Progress Notes (Signed)
 Virtual Visit via Telephone Note   Because of Duane Warren co-morbid illnesses, he is at least at moderate risk for complications without adequate follow up.  This format is felt to be most appropriate for this patient at this time.  Due to technical limitations with video connection Web designer), today's appointment will be conducted as an audio only telehealth visit, and Duane Warren.   All issues noted in this document were discussed and addressed.  No physical exam could be performed with this format.  Evaluation Performed:  Preoperative cardiovascular risk assessment _____________   Date:  04/07/2024   Patient ID:  Duane Warren, DOB December 28, 1955, MRN [id number] Patient Location:  Home Provider location:   Office  Primary Care Provider:  Gerome Brunet, DO Primary Cardiologist:  Elspeth Sage, MD  Chief Complaint / Patient Profile   68 y.o. y/o male with a h/o hypertension, carotid arthrosclerosis, syncope, coronary artery calcification who is pending robotic assisted laparoscopic left radical nephrectomy and presents today for telephonic preoperative cardiovascular risk assessment.  History of Present Illness    Duane Warren is a 67 y.o. male who presents via audio/video conferencing for a telehealth visit today.  Pt was last seen in cardiology clinic on 12/03/2023 by Dr. Gollan.  At that time Duane Warren was doing well and underwent coronary CTA for further evaluation.  Medical management was recommended.  The patient is now pending procedure as outlined above. Since his last visit, he remains stable from a cardiac standpoint.  Today he denies chest pain, shortness of breath, lower extremity edema, fatigue, palpitations, melena, hematuria, hemoptysis, diaphoresis, weakness, presyncope, syncope, orthopnea, and PND.   Past Medical History    Past Medical History:  Diagnosis Date   Agatston coronary  artery calcium  score greater than 400    a. 12/2015 Cardiac CT: Ca2+ score of 726 (91st %'ile).   Anxiety    Apical variant hypertrophic cardiomyopathy (HCC)    a. 01/2016 Echo: EF 55-60%, no rwma, mild AI, Ao root 39mm, mild MR, mod dil LA, mod TR, nl PASP; b. 12/2016 Cardiac MRI: EF 64%, sev apical hypertrophy up to 22mm in diastole. LGE in apical inf, lateral, and apical walls; c. 01/2018 Echo: EF 50-55%, gr2 DD. Mod-Sev apical hypertrophy. Mild AI. Mildly dil Ao root (3.8cm) and asc Ao (3.9cm). Sev dil LA. Nl RV fxn.   Dilated aortic root (HCC)    a. 01/2016 Ao root 39mm; b. 12/2016 Cardiac MRI: Mild aneurysmal dil of Asc Ao - 42mm; c. 01/2018 Echo: dil Ao root (3.8cm) and asc Ao (3.9cm).   Hyperaldosteronism (HCC)    Hypercholesteremia    Hypertension    Syncope    a. s/p MDT Linq.   Tremor    Past Surgical History:  Procedure Laterality Date   HERNIA REPAIR     KNEE SURGERY     unknown which side   LOOP RECORDER INSERTION N/A 04/30/2017   Procedure: Loop Recorder Insertion;  Surgeon: Sage Elspeth BROCKS, MD;  Location: [facility] INVASIVE CV LAB;  Service: Cardiovascular;  Laterality: N/A;   SKIN GRAFT      Allergies  Allergies  Allergen Reactions   Molds & Smuts Other (See Comments)    Home Medications    Prior to Admission medications   Medication Sig Start Date End Date Taking? Authorizing Provider  allopurinol (ZYLOPRIM) 100 MG tablet Take 100 mg by mouth daily.    [provider]  amLODipine  (  NORVASC ) 5 MG tablet Take 1 tablet (5 mg total) by mouth daily. 12/10/23 12/04/24  Gollan, Timothy J, MD  busPIRone (BUSPAR) 5 MG tablet Take 5 mg by mouth 2 (two) times daily. Patient not taking: Reported on 03/19/2024 08/27/23   [provider]  Cholecalciferol (VITAMIN D ) 2000 units tablet Take 2,000 Units by mouth daily.    [provider]  colchicine  0.6 MG tablet Take 0.6 mg by mouth as needed.    [provider]  ezetimibe  (ZETIA ) 10 MG tablet TAKE 1 TABLET  BY MOUTH EVERY DAY 03/28/24   Gollan, Timothy J, MD  fluticasone  (FLONASE ) 50 MCG/ACT nasal spray Place 1-2 sprays into both nostrils daily as needed (nasal congestion). 03/19/24   Luke Orlan HERO, DO  metoprolol  tartrate (LOPRESSOR ) 50 MG tablet Take 1 tablet (50 mg total) by mouth once for 1 dose. 12/03/23 12/03/23  Gollan, Timothy J, MD  propranolol  (INDERAL ) 20 MG tablet TAKE 1 TABLET BY MOUTH 3 TIMES DAILY AS NEEDED. 10/15/23   Onita Duos, MD  rosuvastatin  (CRESTOR ) 5 MG tablet TAKE 1 TABLET (5 MG TOTAL) BY MOUTH DAILY. 03/28/24   Gollan, Timothy J, MD  sildenafil (VIAGRA) 100 MG tablet Take 50-100 mg by mouth daily as needed. 08/19/22   [provider]  spironolactone  (ALDACTONE ) 25 MG tablet Take 1 tablet (25 mg total) by mouth daily. 04/09/23   Gollan, Timothy J, MD  tamsulosin (FLOMAX) 0.4 MG CAPS capsule Take 0.4 mg by mouth at bedtime. 08/05/23   [provider]  valsartan  (DIOVAN ) 160 MG tablet Take 1 tablet (160 mg total) by mouth 2 (two) times daily. 07/26/23   Perla Evalene PARAS, MD    Physical Exam    Vital Signs:  Duane Warren does not have vital signs available for review today.  Given telephonic nature of communication, physical exam is limited. AAOx3. NAD. Normal affect.  Speech and respirations are unlabored.  Accessory Clinical Findings    None  Assessment & Plan    1.  Preoperative Cardiovascular Risk Assessment:Procedure: robotic assisted laparoscopic left radical nephrectomy   Date of Surgery:  [place] 05/02/2024                                 Surgeon:  Dr. Devere Surgeon's Group or Practice Name:  [facility] Phone number:  [phone] Fax number:  [phone]      Primary Cardiologist: Elspeth Sage, MD  Chart reviewed as part of pre-operative protocol coverage. Given past medical history and time since last visit, based on ACC/AHA guidelines, Duane Warren would be at acceptable risk for the planned procedure without  further cardiovascular testing.   His RCRI is low risk, 0.9% risk of major cardiac event.  He is able to complete greater than 4 METS of physical activity.  Patient was advised that if he develops new symptoms prior to surgery to contact our office to arrange a follow-up appointment.  He verbalized understanding.  I will route this recommendation to the requesting party via Epic fax function and remove from pre-op pool.     Time:   Today, I have spent 5 minutes with the patient with telehealth technology discussing medical history, symptoms, and management plan.  I spent 10 minutes reviewing patient's past cardiac history and cardiac medications.    Josefa HERO [place], NP  04/07/2024, 6:57 AM

## 2024-04-08 ENCOUNTER — Telehealth: Payer: Self-pay | Admitting: Internal Medicine

## 2024-04-08 NOTE — Telephone Encounter (Signed)
 Scheduled appointments per 6/23 los. Talked with the patient and he is aware of the made appointments.

## 2024-04-10 ENCOUNTER — Telehealth: Payer: Self-pay | Admitting: Cardiovascular Disease

## 2024-04-10 NOTE — Discharge Instructions (Signed)

## 2024-04-10 NOTE — Telephone Encounter (Signed)
 Spoke with patient.  Pt has concerns regarding his heart rate being too low and increased fatigue; denies CP, SOB.  No distress noted while talking with pt;  Pt stated that he had an appointment at his PCP on 6/25 and his HR 47; states PCP suggested that he reach out to his cardiologist; Pt took HR this morning after activity and it was 71, but had taken it other times and it was in the 50s; pt states he is going out of town next week and agreeable to obtain appointment with one of APPs; scheduled an apt with Cadence Furth PA-C on 6/27.  Encouraged pt to stay hydrated and if he started feeling worse prior to appointment call 911 or go to ER.

## 2024-04-10 NOTE — Telephone Encounter (Signed)
 STAT if HR is under 50 or over 120  (normal HR is 60-100 beats per minute)  What is your heart rate? 71 now, 58 on 6/4, 59 in Feb  Do you have a log of your heart rate readings (document readings)? No  Do you have any other symptoms? Just feeling fatigue

## 2024-04-11 ENCOUNTER — Ambulatory Visit
Admission: RE | Admit: 2024-04-11 | Discharge: 2024-04-11 | Disposition: A | Discharge status: Home / Self Care | Source: Ambulatory Visit | Attending: Orthopedic Surgery | Admitting: Orthopedic Surgery

## 2024-04-11 ENCOUNTER — Ambulatory Visit: Attending: Medical | Admitting: Medical

## 2024-04-11 ENCOUNTER — Encounter: Payer: Self-pay | Admitting: Medical

## 2024-04-11 VITALS — BP 134/80 | HR 62 | Ht 71.0 in | Wt 191.2 lb

## 2024-04-11 DIAGNOSIS — I2510 Atherosclerotic heart disease of native coronary artery without angina pectoris: Secondary | ICD-10-CM | POA: Diagnosis present

## 2024-04-11 DIAGNOSIS — I422 Other hypertrophic cardiomyopathy: Secondary | ICD-10-CM | POA: Diagnosis present

## 2024-04-11 DIAGNOSIS — I10 Essential (primary) hypertension: Secondary | ICD-10-CM | POA: Diagnosis present

## 2024-04-11 DIAGNOSIS — R0602 Shortness of breath: Secondary | ICD-10-CM | POA: Diagnosis present

## 2024-04-11 DIAGNOSIS — I6523 Occlusion and stenosis of bilateral carotid arteries: Secondary | ICD-10-CM | POA: Diagnosis present

## 2024-04-11 DIAGNOSIS — R001 Bradycardia, unspecified: Secondary | ICD-10-CM | POA: Diagnosis present

## 2024-04-11 DIAGNOSIS — M542 Cervicalgia: Secondary | ICD-10-CM

## 2024-04-11 DIAGNOSIS — Z79899 Other long term (current) drug therapy: Secondary | ICD-10-CM | POA: Diagnosis present

## 2024-04-11 DIAGNOSIS — E782 Mixed hyperlipidemia: Secondary | ICD-10-CM | POA: Diagnosis present

## 2024-04-11 MED ORDER — PROPRANOLOL HCL 10 MG PO TABS: 10.0000 mg | ORAL_TABLET | Freq: Three times a day (TID) | ORAL | 1 refills | Status: DC | PRN

## 2024-04-11 MED ORDER — TRIAMCINOLONE ACETONIDE 40 MG/ML IJ SUSP (RADIOLOGY)
60.0000 mg | Freq: Once | INTRAMUSCULAR | Status: AC
  Administered 2024-04-11: 60 mg via EPIDURAL

## 2024-04-11 MED ORDER — IOPAMIDOL (ISOVUE-M 300) INJECTION 61%
1.0000 mL | Freq: Once | INTRAMUSCULAR | Status: AC
Start: 2024-04-11 — End: 2024-04-11
  Administered 2024-04-11: 1 mL via EPIDURAL

## 2024-04-11 MED ORDER — PROPRANOLOL HCL 10 MG PO TABS: 10.0000 mg | ORAL_TABLET | Freq: Every day | ORAL | 1 refills | Status: AC

## 2024-04-11 NOTE — Progress Notes (Addendum)
 Cardiology Office Note   Date:  04/11/2024  ID:  Duane Warren, DOB 1955-10-22, MRN [id number] PCP: Gerome Brunet, DO  [facility] Providers Cardiologist:  Elspeth Sage, MD   History of Present Illness Duane Warren is a 68 y.o. male with a h/o gout, hernia repair, knee arthoscopy, primary aldosteronism, apical hypertrophic cardiomyopathy, NSVT, nonobstructive CAD, NSVT, mildly dilated ascending aorta 41mm who presents for follow-up.   Cardiac score was 726 in 2017. Echo in 2017 showed LVEF 55-60%, mod to sever apical hypertophy, mild MR/TR. Cardiac MRI in 2018 showed severe hypertrophy apical regions. He saw EP for ICD consideration and Dr. Sage felt it was not necessary and ILR was placed. He was noted to have run of NSVT, which was asymptomatic. Last interrogation was in 2022. Echo in 2019 showed low normal LVEF, G2DD, diastolic dysfunction, mod to severe apical hypertrophy. Echo in March 2024 showed LVEF 50-55%, moderately dilated left atrium, apical and periapical LVH. He has been on propranolol  for tremors.  The patient was last seen 11/2023. Cardiac CTA showed mild nonobstructive CAD.  Today, the patient reports pulse of 47. Says pulse has been in the 50s. He reports intermittent spells of dizziness and lightheadedness. No syncope. He feels fatigue and weakness, unsure if this is related to low heart rate. He denies heart racing, palpitations. He denies chest pain. He has occasional SOB with laying down or when he bends over. He takes propranolol  for tremors. 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  Studies Reviewed EKG Interpretation Date/Time:  Friday April 11 2024 15:45:27 EDT Ventricular Rate:  62 PR Interval:  140 QRS Duration:  94 QT Interval:  430 QTC Calculation: 436 R Axis:   34  Text Interpretation: Normal sinus rhythm Minimal voltage criteria for LVH, may be normal variant ( Sokolow-Lyon ) Marked ST abnormality, possible  anterior subendocardial injury When compared with ECG of 03-Dec-2023 15:39, No significant change was found Confirmed by Franchester, Emerlyn Mehlhoff ([id number]) on 04/11/2024 3:49:08 PM    Cardiac CTA 12/2023 IMPRESSION: 1. Coronary calcium  score of 2133. This was 95th percentile for age and sex matched control.   2. Normal coronary origin with right dominance.   3. Mild stenosis (25-49%) in LAD, RCA.   4. Minimal LM and LCx stenosis (<25%).   5. CAD-RADS 2. Mild non-obstructive CAD (25-49%). Consider non-atherosclerotic causes of chest pain. Consider preventive therapy and risk factor modification.    Echo 12/2022   1. Left ventricular ejection fraction, by estimation, is 50 to 55%. The  left ventricle has low normal function. The left ventricle has no regional  wall motion abnormalities. The left ventricular internal cavity size was  mildly dilated. There is moderate   concentric left ventricular hypertrophy. Left ventricular diastolic  parameters are consistent with Grade II diastolic dysfunction  (pseudonormalization).   2. Right ventricular systolic function is normal. The right ventricular  size is mildly enlarged. There is normal pulmonary artery systolic  pressure.   3. Left atrial size was moderately dilated.   4. The mitral valve is grossly normal. Trivial mitral valve  regurgitation. No evidence of mitral stenosis.   5. The aortic valve is grossly normal. There is mild calcification of the  aortic valve. There is mild thickening of the aortic valve. Aortic valve  regurgitation is mild. Aortic valve sclerosis is present, with no evidence  of aortic valve stenosis.   6. Aortic dilatation noted. There is mild dilatation of the ascending  aorta,  measuring 42 mm.   7. The inferior vena cava is normal in size with greater than 50%  respiratory variability, suggesting right atrial pressure of 3 mmHg.   Comparison(s): Changes from prior study are noted. Prior ascending aorta  3.9 cm, now  4.2 com.   cMRI 2018  FINDINGS: 1. Moderately dilated left ventricle with normal systolic function (LVEF = 64%).   There is severe hypertrophy of the apical segments measuring up to 22 mm in diastole. During systole there is a typical spade shape of the left ventricle. Basal portion of the left ventricle have only mild hypertrophy.   There is a late gadolinium enhancement present in the apical inferior, lateral walls and in the true apex.   LVEDD:  64 mm   LVESD:  40 mm   LVEDV:  163 ml   LVESV:  59 ml   SV:  104 ml   Myocardial mass:  196 g   2. Normal right ventricular size, thickness and systolic function (RVEF = 59%) with no regional wall motion abnormalities.   3.  Moderate left and right atrial dilatation.   4.  Mild mitral and moderate tricuspid regurgitation.   5. Normal size of the aortic root measuring 38 mm. Mild aneurysmal dilatation of the ascending aorta measuring 42 mm. Mild dilatation of the pulmonary artery measuring 32 mm.   6.  Normal pericardium, no pericardial effusion.      Physical Exam VS:  BP 134/80   Pulse 62   Ht 5' 11 (1.803 m)   Wt 191 lb 3.2 oz (86.7 kg)   SpO2 96%   BMI 26.67 kg/m        Wt Readings from Last 3 Encounters:  04/11/24 191 lb 3.2 oz (86.7 kg)  04/07/24 191 lb 3.2 oz (86.7 kg)  03/19/24 193 lb (87.5 kg)    GEN: Well nourished, well developed in no acute distress NECK: No JVD; No carotid bruits CARDIAC: RRR, no murmurs, rubs, gallops RESPIRATORY:  Clear to auscultation without rales, wheezing or rhonchi  ABDOMEN: Soft, non-tender, non-distended EXTREMITIES:  No edema; No deformity   ASSESSMENT AND PLAN  Bradycardia Oncologist noted HR into the 40s during visit. Patient says HR is 50-low 60s. EKG today shows NSR HR 62bpm. He reports occasional dizziness and lightheadedness, unsure if this is related. He takes propranolol  20mg  daily for tremors. I will decrease this to 10mg  daily.   Carotid artery  disease US  in 2024 showed 1-39% bilaterally. Continue statin and Zetia .   CAD Cardiac CTA 12/2023 showed mild non-obstructive CAD. Patient denies anginal symptoms.   LHV- apical variant cMRI in 2018 showed severe LVH of apical segments. Patintn was seen by EP for ICD consideration. He saw Dr. Fernande who did not feel ICD was indicated and ILR was placed, which found asymptomatic NSVT. Patient denies syncope. He has occasional dizziness and lightheadedness which seems to be chronic.   HLD LDL less than 70. Continue Crestor  and Zetia .   HTN BP is normal today, continue spironolactone , valsartan , propranolol  and amlodipine .  SOB Patient reports SOB with he lays flat or bends over, which seems to be chronic. I recommended repeat echo, but patient declined. He appears euvolemic. I will check a BNP.        Dispo: Follow-up in 2-3 months  Signed, Duane Auguste VEAR Fishman, PA-C

## 2024-04-11 NOTE — Patient Instructions (Signed)
 Medication Instructions:   Decrease Propanolol to 10 MG daily.  *If you need a refill on your cardiac medications before your next appointment, please call your pharmacy*  Lab Work: Your provider would like for you to have following labs drawn today BNP.    If you have labs (blood work) drawn today and your tests are completely normal, you will receive your results only by: MyChart Message (if you have MyChart) OR A paper copy in the mail If you have any lab test that is abnormal or we need to change your treatment, we will call you to review the results.  Testing/Procedures: No test ordered today   Follow-Up: At [facility], you and your health needs are our priority.  As part of our continuing mission to provide you with exceptional heart care, our providers are all part of one team.  This team includes your primary Cardiologist (physician) and Advanced Practice Providers or APPs (Physician Assistants and Nurse Practitioners) who all work together to provide you with the care you need, when you need it.  Your next appointment:   2 -3 month(s)  Provider:     Mikey Fishman, PA-C  We recommend signing up for the patient portal called MyChart.  Sign up information is provided on this After Visit Summary.  MyChart is used to connect with patients for Virtual Visits (Telemedicine).  Patients are able to view lab/test results, encounter notes, upcoming appointments, etc.  Non-urgent messages can be sent to your provider as well.   To learn more about what you can do with MyChart, go to [email or web address].

## 2024-04-13 LAB — BRAIN NATRIURETIC PEPTIDE: BNP: 256.7 pg/mL — ABNORMAL HIGH (ref 0.0–100.0)

## 2024-04-14 ENCOUNTER — Ambulatory Visit: Payer: Self-pay

## 2024-04-14 ENCOUNTER — Telehealth: Payer: Self-pay | Admitting: Medical

## 2024-04-14 DIAGNOSIS — R0602 Shortness of breath: Secondary | ICD-10-CM

## 2024-04-14 DIAGNOSIS — Z79899 Other long term (current) drug therapy: Secondary | ICD-10-CM

## 2024-04-14 MED ORDER — HYDROCHLOROTHIAZIDE 12.5 MG PO CAPS: 12.5000 mg | ORAL_CAPSULE | Freq: Every day | ORAL | 3 refills | Status: DC

## 2024-04-14 NOTE — Telephone Encounter (Signed)
 The patient has been notified of the result along with recommendations. Pt verbalized understanding. All questions (if any) were answered    Hydrochlorothiazide 12.5 mg, ECHO, and BMP order placed

## 2024-04-14 NOTE — Telephone Encounter (Signed)
 Pt made aware of MD's recommendation. Pt stated he was seen in office on 04/11/24 and Cadence's made adjustments to his propranolol .   Gollan, Timothy J, MD to Cristopher Olivia PARAS, RN     04/13/24 11:13 AM Low heart rate with adequate blood pressure typically does not cause symptoms Heart rate will typically increase with activity which is appropriate, chronotropic competence Most people in this situation do not need a pacemaker Would make sure still doing exercise program TGollan

## 2024-04-14 NOTE — Telephone Encounter (Signed)
 Pt would like to go over results that he received in Mychart.

## 2024-04-23 ENCOUNTER — Encounter (HOSPITAL_COMMUNITY): Admission: RE | Admit: 2024-04-23 | Source: Ambulatory Visit

## 2024-04-25 DIAGNOSIS — Z79899 Other long term (current) drug therapy: Secondary | ICD-10-CM

## 2024-04-28 ENCOUNTER — Ambulatory Visit: Attending: Medical

## 2024-04-28 ENCOUNTER — Ambulatory Visit: Payer: Self-pay | Admitting: Medical

## 2024-04-28 DIAGNOSIS — R0602 Shortness of breath: Secondary | ICD-10-CM | POA: Insufficient documentation

## 2024-04-28 LAB — ECHOCARDIOGRAM COMPLETE
AR max vel: 2.5 cm2
AV Area VTI: 2.25 cm2
AV Area mean vel: 2.17 cm2
AV Mean grad: 3 mmHg
AV Peak grad: 6.3 mmHg
Ao pk vel: 1.25 m/s
S' Lateral: 3.73 cm

## 2024-04-29 ENCOUNTER — Ambulatory Visit: Payer: Self-pay | Admitting: Medical

## 2024-04-29 LAB — BASIC METABOLIC PANEL WITH GFR
BUN/Creatinine Ratio: 15 (ref 10–24)
BUN: 21 mg/dL (ref 8–27)
CO2: 23 mmol/L (ref 20–29)
Calcium: 9.2 mg/dL (ref 8.6–10.2)
Chloride: 104 mmol/L (ref 96–106)
Creatinine, Ser: 1.42 mg/dL — ABNORMAL HIGH (ref 0.76–1.27)
Glucose: 82 mg/dL (ref 70–99)
Potassium: 4.1 mmol/L (ref 3.5–5.2)
Sodium: 142 mmol/L (ref 134–144)
eGFR: 53 mL/min/1.73 — ABNORMAL LOW (ref >59)

## 2024-05-02 ENCOUNTER — Encounter: Payer: Self-pay | Admitting: Medical

## 2024-05-02 ENCOUNTER — Inpatient Hospital Stay (HOSPITAL_COMMUNITY): Admit: 2024-05-02 | Admitting: Urology

## 2024-05-02 ENCOUNTER — Ambulatory Visit: Attending: Medical | Admitting: Medical

## 2024-05-02 VITALS — BP 110/64 | HR 62 | Ht 71.0 in | Wt 187.8 lb

## 2024-05-02 DIAGNOSIS — I422 Other hypertrophic cardiomyopathy: Secondary | ICD-10-CM

## 2024-05-02 DIAGNOSIS — I2510 Atherosclerotic heart disease of native coronary artery without angina pectoris: Secondary | ICD-10-CM | POA: Diagnosis not present

## 2024-05-02 DIAGNOSIS — I10 Essential (primary) hypertension: Secondary | ICD-10-CM | POA: Diagnosis not present

## 2024-05-02 DIAGNOSIS — R001 Bradycardia, unspecified: Secondary | ICD-10-CM

## 2024-05-02 DIAGNOSIS — Z79899 Other long term (current) drug therapy: Secondary | ICD-10-CM | POA: Insufficient documentation

## 2024-05-02 DIAGNOSIS — I779 Disorder of arteries and arterioles, unspecified: Secondary | ICD-10-CM

## 2024-05-02 DIAGNOSIS — E782 Mixed hyperlipidemia: Secondary | ICD-10-CM

## 2024-05-02 SURGERY — NEPHRECTOMY, RADICAL, ROBOT-ASSISTED, LAPAROSCOPIC, ADULT
Anesthesia: General | Laterality: Left

## 2024-05-02 NOTE — Progress Notes (Signed)
 Cardiology Office Note   Date:  05/08/2024  ID:  Duane Warren, DOB 04-14-1956, MRN [id number] PCP: Gerome Brunet, DO  [facility] Providers Cardiologist:  Elspeth Sage, MD   History of Present Illness Duane Warren is a 68 y.o. male  with a h/o gout, hernia repair, knee arthoscopy, primary aldosteronism, apical hypertrophic cardiomyopathy, NSVT, nonobstructive CAD, NSVT, mildly dilated ascending aorta 41mm who presents for follow-up of bradycardia.    Cardiac score was 726 in 2017. Echo in 2017 showed LVEF 55-60%, mod to sever apical hypertophy, mild MR/TR. Cardiac MRI in 2018 showed severe hypertrophy apical regions. He saw EP for ICD consideration and Dr. Sage felt it was not necessary and ILR was placed. He was noted to have run of NSVT, which was asymptomatic. Last interrogation was in 2022. Echo in 2019 showed low normal LVEF, G2DD, diastolic dysfunction, mod to severe apical hypertrophy. Echo in March 2024 showed LVEF 50-55%, moderately dilated left atrium, apical and periapical LVH. He has been on propranolol  for tremors.   The patient was last seen 11/2023. Cardiac CTA showed mild nonobstructive CAD.  He was seen 04/11/24 reporting oncology found HR in the 40s, he reported 50-60s was normal. EKG showed Hr 62bpm. Echo showed LVEF 55-60%, no WMA, moderately dilated LA, mild MR, mild AI, ascending aorta measuring 40mm.   Today, the patient reports he is overall doing well. Echo was reviewed. He would like a uric acid level drawn. He denies chest pain, SOB or LLE. He is still taking hydrochlorothiazide . Orthopnea resolved.    Studies Reviewed EKG Interpretation Date/Time:  Friday May 02 2024 08:09:15 EDT Ventricular Rate:  62 PR Interval:  104 QRS Duration:  90 QT Interval:  454 QTC Calculation: 460 R Axis:   40  Text Interpretation: Sinus rhythm with short PR with Premature atrial complexes Minimal voltage criteria for LVH, may be normal variant (  Sokolow-Lyon ) Marked ST abnormality, possible inferior subendocardial injury Marked ST abnormality, possible anterior subendocardial injury When compared with ECG of 11-Apr-2024 15:45, Premature atrial complexes are now Present Confirmed by Franchester, Sherlin Sonier ([id number]) on 05/02/2024 12:30:20 PM    Echo 04/2024 1. Left ventricular ejection fraction, by estimation, is 55 to 60%. The  left ventricle has normal function. The left ventricle has no regional  wall motion abnormalities. There is severe left ventricular hypertrophy of  the apical segment. Left  ventricular diastolic parameters are indeterminate.   2. Right ventricular systolic function is normal. The right ventricular  size is normal.   3. Left atrial size was mild to moderately dilated.   4. The mitral valve is normal in structure. Mild mitral valve  regurgitation.   5. The aortic valve is tricuspid. Aortic valve regurgitation is mild.   6. Aortic dilatation noted. There is mild dilatation of the ascending  aorta, measuring 40 mm.   7. The inferior vena cava is normal in size with greater than 50%  respiratory variability, suggesting right atrial pressure of 3 mmHg.    Cardiac CTA 12/2023 IMPRESSION: 1. Coronary calcium  score of 2133. This was 95th percentile for age and sex matched control.   2. Normal coronary origin with right dominance.   3. Mild stenosis (25-49%) in LAD, RCA.   4. Minimal LM and LCx stenosis (<25%).   5. CAD-RADS 2. Mild non-obstructive CAD (25-49%). Consider non-atherosclerotic causes of chest pain. Consider preventive therapy and risk factor modification.     Echo 12/2022    1. Left ventricular ejection fraction,  by estimation, is 50 to 55%. The  left ventricle has low normal function. The left ventricle has no regional  wall motion abnormalities. The left ventricular internal cavity size was  mildly dilated. There is moderate   concentric left ventricular hypertrophy. Left ventricular diastolic   parameters are consistent with Grade II diastolic dysfunction  (pseudonormalization).   2. Right ventricular systolic function is normal. The right ventricular  size is mildly enlarged. There is normal pulmonary artery systolic  pressure.   3. Left atrial size was moderately dilated.   4. The mitral valve is grossly normal. Trivial mitral valve  regurgitation. No evidence of mitral stenosis.   5. The aortic valve is grossly normal. There is mild calcification of the  aortic valve. There is mild thickening of the aortic valve. Aortic valve  regurgitation is mild. Aortic valve sclerosis is present, with no evidence  of aortic valve stenosis.   6. Aortic dilatation noted. There is mild dilatation of the ascending  aorta, measuring 42 mm.   7. The inferior vena cava is normal in size with greater than 50%  respiratory variability, suggesting right atrial pressure of 3 mmHg.   Comparison(s): Changes from prior study are noted. Prior ascending aorta  3.9 cm, now 4.2 com.    cMRI 2018  FINDINGS: 1. Moderately dilated left ventricle with normal systolic function (LVEF = 64%).   There is severe hypertrophy of the apical segments measuring up to 22 mm in diastole. During systole there is a typical spade shape of the left ventricle. Basal portion of the left ventricle have only mild hypertrophy.   There is a late gadolinium enhancement present in the apical inferior, lateral walls and in the true apex.   LVEDD:  64 mm   LVESD:  40 mm   LVEDV:  163 ml   LVESV:  59 ml   SV:  104 ml   Myocardial mass:  196 g   2. Normal right ventricular size, thickness and systolic function (RVEF = 59%) with no regional wall motion abnormalities.   3.  Moderate left and right atrial dilatation.   4.  Mild mitral and moderate tricuspid regurgitation.   5. Normal size of the aortic root measuring 38 mm. Mild aneurysmal dilatation of the ascending aorta measuring 42 mm. Mild dilatation of the  pulmonary artery measuring 32 mm.   6.  Normal pericardium, no pericardial effusion.       Physical Exam VS:  BP 110/64 (BP Location: Left Arm, Patient Position: Sitting, Cuff Size: Normal)   Pulse 62   Ht 5' 11 (1.803 m)   Wt 187 lb 12.8 oz (85.2 kg)   SpO2 97%   BMI 26.19 kg/m        Wt Readings from Last 3 Encounters:  05/02/24 187 lb 12.8 oz (85.2 kg)  04/11/24 191 lb 3.2 oz (86.7 kg)  04/07/24 191 lb 3.2 oz (86.7 kg)    GEN: Well nourished, well developed in no acute distress NECK: No JVD; No carotid bruits CARDIAC: RRR, no murmurs, rubs, gallops RESPIRATORY:  Clear to auscultation without rales, wheezing or rhonchi  ABDOMEN: Soft, non-tender, non-distended EXTREMITIES:  No edema; No deformity   ASSESSMENT AND PLAN  Bradycardia Propranolol  decreased at the last visit. EKG today shows NSR 62bpm PACs. Heart rate is normally 50-60s.  Continue propranolol  10 mg daily.  Carotid artery disease Carotid US  in 2024 showed bilateral 1-39% disease. No symptoms reported.  Continue cholesterol management.  CAD Cardiac CTA 11/2023  showed mild nonobstructive CAD. He reports generalized fatigue, but no overt chest pain.   LVH-apical variant Cardiac MRI in 2018 showed severe LVH of apical segments.  Patient was seen by EP for ICD consideration.  He saw Dr. Fernande who did not feel ICD was indicated and ILR was placed, which found asymptomatic NSVT.  Patient denies any syncope or presyncope.  HLD LDL less than 70.  Continue Crestor  and Zetia .  HTN BP is good today, continue amlodipine , hydrochlorothiazide , spironolactone , Propranolol , spironolactone  and Valsartan .  SOB BNP was mildly elevated to the 200s and he was started on hydrochlorothiazide  with improvement of breathing. Echo showed normal LVEF  Dilated Ascending Aorta Echo showed dilated ascending aorta 40mm.   Gout He is requesting uric acid level for his PCP.         Dispo: Follow-up in 6 months  Signed, Duane Warren  VEAR Fishman, PA-C

## 2024-05-02 NOTE — Patient Instructions (Signed)
 Medication Instructions:  Your physician recommends that you continue on your current medications as directed. Please refer to the Current Medication list given to you today.    *If you need a refill on your cardiac medications before your next appointment, please call your pharmacy*  Lab Work: Your provider would like for you to have following labs drawn today Uric Acid.     Testing/Procedures: No test ordered today   Follow-Up: At [facility], you and your health needs are our priority.  As part of our continuing mission to provide you with exceptional heart care, our providers are all part of one team.  This team includes your primary Cardiologist (physician) and Advanced Practice Providers or APPs (Physician Assistants and Nurse Practitioners) who all work together to provide you with the care you need, when you need it.  Your next appointment:   6 month(s)  Provider:   Mikey Fishman, PA-C

## 2024-05-03 LAB — URIC ACID: Uric Acid: 6.8 mg/dL (ref 3.8–8.4)

## 2024-05-05 ENCOUNTER — Ambulatory Visit: Payer: Self-pay | Admitting: Medical

## 2024-05-08 ENCOUNTER — Other Ambulatory Visit: Payer: Self-pay | Admitting: Cardiovascular Disease

## 2024-05-09 ENCOUNTER — Telehealth: Payer: Self-pay | Admitting: Cardiovascular Disease

## 2024-05-09 MED ORDER — VALSARTAN 160 MG PO TABS: 160.0000 mg | ORAL_TABLET | Freq: Two times a day (BID) | ORAL | 3 refills | Status: DC

## 2024-05-09 NOTE — Telephone Encounter (Signed)
 RX sent to requested Pharmacy

## 2024-05-09 NOTE — Telephone Encounter (Signed)
*  STAT* If patient is at the pharmacy, call can be transferred to refill team.   1. Which medications need to be refilled? (please list name of each medication and dose if known)  valsartan  (DIOVAN ) 160 MG tablet    2. Which pharmacy/location (including street and city if local pharmacy) is medication to be sent to? [id number] - [place], [place] - [address]   3. Do they need a 30 day or 90 day supply?  90 day supply + refills

## 2024-05-16 ENCOUNTER — Telehealth

## 2024-05-19 ENCOUNTER — Telehealth: Payer: Self-pay | Admitting: Internal Medicine

## 2024-05-19 NOTE — Telephone Encounter (Signed)
 Cancelled appointments per the patients request. Confirmed with the patient that he only wanted to cancel at this time.

## 2024-05-22 ENCOUNTER — Inpatient Hospital Stay: Admitting: Internal Medicine

## 2024-05-22 ENCOUNTER — Inpatient Hospital Stay

## 2024-05-23 ENCOUNTER — Telehealth: Payer: Self-pay | Admitting: Cardiovascular Disease

## 2024-05-23 NOTE — Telephone Encounter (Signed)
 Pt c/o medication issue:  1. Name of Medication:   propranolol  (INDERAL ) 10 MG tablet    2. How are you currently taking this medication (dosage and times per day)? As written   3. Are you having a reaction (difficulty breathing--STAT)? No   4. What is your medication issue? Pt called in stating his bottle says he should be taking 3x daily but its written for only 1x daily. Please advise how he should be taking.

## 2024-05-23 NOTE — Telephone Encounter (Signed)
 Called patient, advised that per our records he decreased down to 10 mg daily Propanolol back in June, updated RX was sent to pharmacy on that date, confirmed received by pharmacy.   Patient aware he should only take 10 mg daily- and make his pharmacy aware this was updated, on our system and update it was correct and up to date.   Patient thankful for call back, verbalized understanding.

## 2024-06-19 HISTORY — PX: OTHER SURGICAL HISTORY: SHX169

## 2024-07-08 ENCOUNTER — Ambulatory Visit: Admitting: Medical

## 2024-07-17 ENCOUNTER — Telehealth: Payer: Self-pay | Admitting: Cardiovascular Disease

## 2024-07-17 ENCOUNTER — Telehealth: Payer: Self-pay | Admitting: Pharmacy Technician

## 2024-07-17 ENCOUNTER — Other Ambulatory Visit (HOSPITAL_COMMUNITY): Payer: Self-pay

## 2024-07-17 NOTE — Telephone Encounter (Signed)
 Pt would like a call back in regards to medication Pradaxa 150 mg that was prescribed by the hospital. Pt would also like to discuss injection that was prescribed by ED Please Advise

## 2024-07-17 NOTE — Telephone Encounter (Signed)
   Ran test claim for DABIGATRAN. For a 30 day supply and the co-pay is 10.00 . PA is not needed at this time. Nothing saying this is a transition fill.   This test claim was processed through [facility]- copay amounts may vary at other pharmacies due to pharmacy/plan contracts, or as the patient moves through the different stages of their insurance plan.

## 2024-07-18 ENCOUNTER — Other Ambulatory Visit (HOSPITAL_COMMUNITY): Payer: Self-pay

## 2024-07-18 MED ORDER — DABIGATRAN ETEXILATE MESYLATE 150 MG PO CAPS: 150.0000 mg | ORAL_CAPSULE | Freq: Two times a day (BID) | ORAL | 0 refills | Status: DC

## 2024-07-18 NOTE — Telephone Encounter (Addendum)
 Per test claim insurance will pay for 150mg . I called the [facility]'s pharmacy [facility] and asking them to fill this now under NDC: [id number] and that went thru. I told the patient

## 2024-07-18 NOTE — Addendum Note (Signed)
 Addended by: BRIEN SALM on: 07/18/2024 08:11 AM   Modules accepted: Orders

## 2024-07-18 NOTE — Telephone Encounter (Signed)
 Per test claim insurance will pay for 150mg . I called the patient's pharmacy [facility] and asking them to fill this now under NDC: [id number] and that went thru. I told the patient

## 2024-07-18 NOTE — Telephone Encounter (Signed)
 Patient following up. He says the insurance informed him that it wasn't covered because it was written for 150 MG. If the prescription is written for dabigatran (PRADAXA) 75 MG CAPS capsule it will be covered.

## 2024-07-22 ENCOUNTER — Ambulatory Visit: Attending: Medical | Admitting: Medical

## 2024-07-22 ENCOUNTER — Encounter: Payer: Self-pay | Admitting: Medical

## 2024-07-22 VITALS — BP 108/72 | HR 65 | Ht 71.0 in | Wt 183.8 lb

## 2024-07-22 DIAGNOSIS — I2699 Other pulmonary embolism without acute cor pulmonale: Secondary | ICD-10-CM | POA: Diagnosis not present

## 2024-07-22 DIAGNOSIS — R42 Dizziness and giddiness: Secondary | ICD-10-CM | POA: Insufficient documentation

## 2024-07-22 DIAGNOSIS — I5022 Chronic systolic (congestive) heart failure: Secondary | ICD-10-CM | POA: Insufficient documentation

## 2024-07-22 DIAGNOSIS — I2782 Chronic pulmonary embolism: Secondary | ICD-10-CM | POA: Diagnosis not present

## 2024-07-22 DIAGNOSIS — I779 Disorder of arteries and arterioles, unspecified: Secondary | ICD-10-CM | POA: Insufficient documentation

## 2024-07-22 DIAGNOSIS — E782 Mixed hyperlipidemia: Secondary | ICD-10-CM | POA: Insufficient documentation

## 2024-07-22 DIAGNOSIS — I422 Other hypertrophic cardiomyopathy: Secondary | ICD-10-CM | POA: Diagnosis present

## 2024-07-22 DIAGNOSIS — R001 Bradycardia, unspecified: Secondary | ICD-10-CM | POA: Diagnosis not present

## 2024-07-22 NOTE — Patient Instructions (Signed)
 Medication Instructions:  Your physician recommends the following medication changes.  STOP TAKING: Amlodipine   Hydrochlorothiazide   Valsartan     *If you need a refill on your cardiac medications before your next appointment, please call your pharmacy*  Lab Work: No labs ordered today    Testing/Procedures: No test ordered today   Follow-Up: At [facility], you and your health needs are our priority.  As part of our continuing mission to provide you with exceptional heart care, our providers are all part of one team.  This team includes your primary Cardiologist (physician) and Advanced Practice Providers or APPs (Physician Assistants and Nurse Practitioners) who all work together to provide you with the care you need, when you need it.  Your next appointment:   1 month(s)  Provider:   Mikey Fishman, PA-C

## 2024-07-22 NOTE — Progress Notes (Signed)
 Cardiology Office Note   Date:  07/22/2024  ID:  Duane Warren, DOB Dec 20, 1955, MRN [id number] PCP: Gerome Brunet, DO  [facility] Providers Cardiologist:  Elspeth Sage, MD   History of Present Illness Duane Warren is a 68 y.o. male with a h/o gout, hernia repair, knee arthoscopy, primary aldosteronism, apical hypertrophic cardiomyopathy, NSVT, nonobstructive CAD, NSVT, mildly dilated ascending aorta 41mm who presents for hospital follow-up.    Cardiac score was 726 in 2017. Echo in 2017 showed LVEF 55-60%, mod to sever apical hypertophy, mild MR/TR. Cardiac MRI in 2018 showed severe hypertrophy apical regions. He saw EP for ICD consideration and Dr. Sage felt it was not necessary and ILR was placed. He was noted to have run of NSVT, which was asymptomatic. Last interrogation was in 2022. Echo in 2019 showed low normal LVEF, G2DD, diastolic dysfunction, mod to severe apical hypertrophy. Echo in March 2024 showed LVEF 50-55%, moderately dilated left atrium, apical and periapical LVH. He has been on propranolol  for tremors.  Cardiac CTA 12/2023 showed mild nonobstructive CAD.   He was seen 04/11/24 reporting oncology found HR in the 40s. The patient reported heart rate normally 50-60s. EKG showed Hr 62bpm. Echo showed LVEF 55-60%, no WMA, moderately dilated LA, mild MR, mild AI, ascending aorta measuring 40mm.   The patient was last seen 05/02/24 and was overall doing well.   The patient was admitted 06/2024 at [facility]  for Acute PE. It was found incidentally on CT scan. He was started on Dabigatran. Echo showed LVEF 40-45%.  Today, the patient denies chest pain or SOB. No lower leg edema. Yesterday he felt dizzy and lightheaded and BP was low so he held his BP meds. He didn't take BP meds this AM either. No syncope. He reports low appetite. EKG shows NSR HR in the 60s.  Studies Reviewed EKG Interpretation Date/Time:  Tuesday July 22 2024 14:27:35  EDT Ventricular Rate:  65 PR Interval:  116 QRS Duration:  90 QT Interval:  392 QTC Calculation: 407 R Axis:   28  Text Interpretation: Normal sinus rhythm ST & Marked T wave abnormality, consider anterolateral ischemia When compared with ECG of 02-May-2024 08:09, Premature atrial complexes are no longer Present QT has shortened Confirmed by Franchester, Breleigh Carpino ([id number]) on 07/22/2024 2:33:22 PM    Echo 06/2024 Left Ventricle  There is normal left ventricular wall thickness. The left ventricle is mildly dilated. No evidence  of thrombus seen on this study. Ejection Fraction = 40-45%. There is borderline global hypokinesis  of the left ventricle.  Right Ventricle  The right ventricle is normal size. The right ventricular systolic function is normal. The right  ventricular wall motion is normal.  Atria  The left atrium is mildly dilated. Right atrial size is normal.  Mitral Valve  Mitral valve structure is normal. There is no evidence of mitral valve prolapse. There is no  mitral regurgitation noted. No mitral valve stenosis.  Tricuspid Valve  Anatomically normal tricuspid valve. There is no tricuspid valve prolapse. There is trace  tricuspid regurgitation. Estimated RVSP is mildly elevated at 35 mmHg. No evidence of tricuspid  stenosis.  Aortic Valve  The aortic valve appears structurally normal. The aortic valve is trileaflet. Mild to moderate  aortic regurgitation. There is no aortic stenosis.  Pulmonic Valve  No evidence of stenosis. There is no vegetation seen on the pulmonic valve. Mild pulmonic valvular  regurgitation.  Great Vessels  The aortic root is mildly dilated.  IVC  The inferior vena cava was not well visualized.  Pericardium  There is no significant pericardial effusion. There is no pleural effusion noted on this exam.   Echo 04/2024 1. Left ventricular ejection fraction, by estimation, is 55 to 60%. The  left ventricle has normal function. The left ventricle has no  regional  wall motion abnormalities. There is severe left ventricular hypertrophy of  the apical segment. Left  ventricular diastolic parameters are indeterminate.   2. Right ventricular systolic function is normal. The right ventricular  size is normal.   3. Left atrial size was mild to moderately dilated.   4. The mitral valve is normal in structure. Mild mitral valve  regurgitation.   5. The aortic valve is tricuspid. Aortic valve regurgitation is mild.   6. Aortic dilatation noted. There is mild dilatation of the ascending  aorta, measuring 40 mm.   7. The inferior vena cava is normal in size with greater than 50%  respiratory variability, suggesting right atrial pressure of 3 mmHg.    Cardiac CTA 12/2023 IMPRESSION: 1. Coronary calcium  score of 2133. This was 95th percentile for age and sex matched control.   2. Normal coronary origin with right dominance.   3. Mild stenosis (25-49%) in LAD, RCA.   4. Minimal LM and LCx stenosis (<25%).   5. CAD-RADS 2. Mild non-obstructive CAD (25-49%). Consider non-atherosclerotic causes of chest pain. Consider preventive therapy and risk factor modification.     Echo 12/2022  1. Left ventricular ejection fraction, by estimation, is 50 to 55%. The  left ventricle has low normal function. The left ventricle has no regional  wall motion abnormalities. The left ventricular internal cavity size was  mildly dilated. There is moderate   concentric left ventricular hypertrophy. Left ventricular diastolic  parameters are consistent with Grade II diastolic dysfunction  (pseudonormalization).   2. Right ventricular systolic function is normal. The right ventricular  size is mildly enlarged. There is normal pulmonary artery systolic  pressure.   3. Left atrial size was moderately dilated.   4. The mitral valve is grossly normal. Trivial mitral valve  regurgitation. No evidence of mitral stenosis.   5. The aortic valve is grossly normal. There  is mild calcification of the  aortic valve. There is mild thickening of the aortic valve. Aortic valve  regurgitation is mild. Aortic valve sclerosis is present, with no evidence  of aortic valve stenosis.   6. Aortic dilatation noted. There is mild dilatation of the ascending  aorta, measuring 42 mm.   7. The inferior vena cava is normal in size with greater than 50%  respiratory variability, suggesting right atrial pressure of 3 mmHg.   Comparison(s): Changes from prior study are noted. Prior ascending aorta  3.9 cm, now 4.2 com.    cMRI 2018 FINDINGS: 1. Moderately dilated left ventricle with normal systolic function (LVEF = 64%).   There is severe hypertrophy of the apical segments measuring up to 22 mm in diastole. During systole there is a typical spade shape of the left ventricle. Basal portion of the left ventricle have only mild hypertrophy.   There is a late gadolinium enhancement present in the apical inferior, lateral walls and in the true apex.   LVEDD:  64 mm   LVESD:  40 mm   LVEDV:  163 ml   LVESV:  59 ml   SV:  104 ml   Myocardial mass:  196 g   2. Normal right ventricular size, thickness  and systolic function (RVEF = 59%) with no regional wall motion abnormalities.   3.  Moderate left and right atrial dilatation.   4.  Mild mitral and moderate tricuspid regurgitation.   5. Normal size of the aortic root measuring 38 mm. Mild aneurysmal dilatation of the ascending aorta measuring 42 mm. Mild dilatation of the pulmonary artery measuring 32 mm.   6.  Normal pericardium, no pericardial effusion.     Physical Exam VS:  BP 108/72   Pulse 65   Ht 5' 11 (1.803 m)   Wt 183 lb 12.8 oz (83.4 kg)   SpO2 98%   BMI 25.63 kg/m        Wt Readings from Last 3 Encounters:  07/22/24 183 lb 12.8 oz (83.4 kg)  05/02/24 187 lb 12.8 oz (85.2 kg)  04/11/24 191 lb 3.2 oz (86.7 kg)    GEN: Well nourished, well developed in no acute distress NECK: No JVD; No  carotid bruits CARDIAC: RRR, no murmurs, rubs, gallops RESPIRATORY:  Clear to auscultation without rales, wheezing or rhonchi  ABDOMEN: Soft, non-tender, non-distended EXTREMITIES:  No edema; No deformity   ASSESSMENT AND PLAN  HFmrEF Recent hospitalization for Acute PE found incidentally on a CT. He was started on Pradaxa. Echo showed LVEF 40-45%. No chest pain reported. Suspect CM from ?PE.BP is soft I will stop amlodipine , hydrochlorothiazide  and valsartan . He is on spiro and propranolol  (for tremors). He may tolerate Entresto and SGLT2i at follow-up. Plan to re-check an echo in 2-3 months.   Acute PE Breathing is normal. Continue Pradaxa. I will place pulmonology referral.   Dizziness Hypotension BP yesterday was low. I will stop amlodipine , hydrochlorothiazide , and valsartan . Continue propranolol  and spironolactone . We will re-evaluate at follow-up  Carotid artery disease Carotid US  in 2024 showed bilateral 1-39% disease. No symptoms reported. Continue cholesterol management.   LVH-apical variant Cardiac MRI in 2018 showed severe LVH of apical segment. Patient was seen by EP for ICD consideration. He saw Dr. Fernande, who did not feel ICD was indicated. Patient denies syncope or pre-syncope.   HLD LDL less than 70. Continue Crestor  and Zetia      Dispo: Follow-up in 1 month  Signed, Wilbur Labuda VEAR Fishman, PA-C

## 2024-07-23 ENCOUNTER — Ambulatory Visit (INDEPENDENT_AMBULATORY_CARE_PROVIDER_SITE_OTHER): Admitting: Student in an Organized Health Care Education/Training Program

## 2024-07-23 ENCOUNTER — Encounter: Payer: Self-pay | Admitting: Student in an Organized Health Care Education/Training Program

## 2024-07-23 VITALS — BP 110/64 | HR 58 | Temp 97.1°F | Ht 71.0 in | Wt 185.8 lb

## 2024-07-23 DIAGNOSIS — I2699 Other pulmonary embolism without acute cor pulmonale: Secondary | ICD-10-CM

## 2024-07-23 DIAGNOSIS — R911 Solitary pulmonary nodule: Secondary | ICD-10-CM

## 2024-07-23 MED ORDER — DABIGATRAN ETEXILATE MESYLATE 150 MG PO CAPS
150.0000 mg | ORAL_CAPSULE | Freq: Two times a day (BID) | ORAL | 6 refills | Status: DC
Start: 2024-07-23 — End: 2024-07-23

## 2024-07-23 MED ORDER — APIXABAN 5 MG PO TABS: 5.0000 mg | ORAL_TABLET | Freq: Two times a day (BID) | ORAL | 11 refills | Status: AC

## 2024-07-23 NOTE — Progress Notes (Signed)
 Assessment & Plan:   #Pulmonary embolism  An acute pulmonary embolism was identified incidentally post-surgery, involving the left main and left lower segmental pulmonary artery, with no significant cardiac strain on echocardiogram. Managed with Pradaxa due to cost considerations and insurance coverage. Anticoagulation therapy is necessary for at least six months post remission from malignancy, with potential extension based on cancer evaluation and treatment outcomes. Recent studies suggest benefits of extended anticoagulation in cancer-related embolism cases with reduced dose apixaban (2.5 mg bid) in patients with VTE and active malignancy.  Reviewed literature concerning Pradaxa, and given paucity of such evidence and guideline recommendations, would prefer to switch to Apixaban 5 mg twice daily. Will plan for at least 6 months post remission from malignancy.  #Renal cell carcinoma, left kidney, status post partial nephrectomy    Confirmed by biopsy and treated with robotic partial nephrectomy for renal cell carcinoma of the left kidney. Ongoing surveillance is required to assess for recurrence or metastasis, especially given finding of right kidney nodule. Plans are in place to resume follow-up with Dr. Deatrice from oncology as well as with urology at [facility]. We will attempt to obtain and review imaging studies from [facility] and [facility] for ongoing cancer surveillance.  #Pulmonary nodule, right lower lobe    A small right lower lobe pulmonary nodule, approximately 4 mm, was identified on a CT scan in June and noted to be stable on repeat imaging in September of 2025. This requires ongoing surveillance to monitor for changes. Will obtain and compare CT images from [facility] and [facility] to assess nodule stability. Plan for continued surveillance of the pulmonary nodule, potentially in conjunction with other cancer-related imaging. Will likely get a repeat CT in 6 months if he doesn't  require a chest CT for other reasons.   - Apixaban 5 mg orally twice daily   Return in about 3 months (around 10/23/2024).  I spent 60 minutes caring for this patient today, including preparing to see the patient, obtaining a medical history , reviewing a separately obtained history, performing a medically appropriate examination and/or evaluation, counseling and educating the patient/family/caregiver, ordering medications, tests, or procedures, documenting clinical information in the electronic health record, and independently interpreting results (not separately reported/billed) and communicating results to the patient/family/caregiver  Belva November, MD [facility]   End of visit medications:  Meds ordered this encounter  Medications   dabigatran (PRADAXA) 150 MG CAPS capsule    Sig: Take 1 capsule (150 mg total) by mouth 2 (two) times daily.    Dispense:  60 capsule    Refill:  6     Current Outpatient Medications:    allopurinol (ZYLOPRIM) 100 MG tablet, Take 100 mg by mouth daily., Disp: , Rfl:    Cholecalciferol (VITAMIN D ) 2000 units tablet, Take 2,000 Units by mouth daily., Disp: , Rfl:    Coenzyme Q10 (CO Q 10) 100 MG CAPS, Take 1 capsule by mouth at bedtime., Disp: , Rfl:    ezetimibe  (ZETIA ) 10 MG tablet, TAKE 1 TABLET BY MOUTH EVERY DAY, Disp: 90 tablet, Rfl: 3   propranolol  (INDERAL ) 10 MG tablet, Take 1 tablet (10 mg total) by mouth daily., Disp: 90 tablet, Rfl: 1   rosuvastatin  (CRESTOR ) 5 MG tablet, TAKE 1 TABLET (5 MG TOTAL) BY MOUTH DAILY., Disp: 90 tablet, Rfl: 3   sildenafil (VIAGRA) 100 MG tablet, Take 50-100 mg by mouth daily as needed., Disp: , Rfl:    spironolactone  (ALDACTONE ) 25 MG tablet,  TAKE 1 TABLET (25 MG TOTAL) BY MOUTH DAILY., Disp: 90 tablet, Rfl: 3   tamsulosin (FLOMAX) 0.4 MG CAPS capsule, Take 0.4 mg by mouth at bedtime., Disp: , Rfl:    colchicine  0.6 MG tablet, Take 0.6 mg by mouth as needed. (Patient not taking: Reported on  07/23/2024), Disp: , Rfl:    dabigatran (PRADAXA) 150 MG CAPS capsule, Take 1 capsule (150 mg total) by mouth 2 (two) times daily., Disp: 60 capsule, Rfl: 6   fluticasone  (FLONASE ) 50 MCG/ACT nasal spray, Place 1-2 sprays into both nostrils daily as needed (nasal congestion). (Patient not taking: Reported on 07/23/2024), Disp: 16 g, Rfl: 5   Subjective:   PATIENT ID: Duane Warren GENDER: male DOB: 05/23/1956, MRN: [id number]  Chief Complaint  Patient presents with   Pulmonary Embolisim    No SOB or wheezing. Occasional dry cough.     HPI  Discussed the use of AI scribe software for clinical note transcription with the patient, who gave verbal consent to proceed.  Duane Warren is a 68 year old male with renal cell carcinoma and pulmonary embolism who presents to establish care.  He was diagnosed with a left-sided pulmonary embolism on July 08, 2024, following a CT scan of the chest that revealed involvement of the left main pulmonary artery and left lower segmental pulmonary artery. He was treated at the [facility] with heparin infusion, followed by Lovenox, and was discharged on Pradaxa 150 mg twice daily. His echocardiogram while hospitalized showed normal RV function without signs of RV strain or RV pressure overload. He was asymptomatic, and this was incidentally discovered on a follow up CT after surgery.  He has a history of a left renal mass concerning for renal cell carcinoma, first detected on May of 2025. He was seen by oncology (Dr. Sherrod) and referred to urology. He sought a second opinion at [facility] as well as at [facility] with urology. A biopsy was performed of the left renal mass confirming malignancy, and he underwent a robotic partial nephrectomy on June 19, 2024. Previous CT scans of the chest from June of 2025 had shown a sub-centimeter RLL pulmonary nodule which was reported as stable on repeat chest CT 07/08/2024. CT  imaging was also noted for a chronically elevated left hemidiaphragm.  He experiences intermittent shortness of breath, which has been present for months, and occasional wheezing, particularly noted around May 2025. He has a history of using albuterol  inhalers, which he feels are ineffective. No significant respiratory symptoms at present, such as consistent shortness of breath or chest tightness.  His past medical history includes coronary artery disease, hypertrophic cardiomyopathy, benign prostatic hyperplasia, and prediabetes. He has experienced low blood pressure post-surgery, leading to medication adjustments. He reports significant weight loss post-surgery and a history of fatigue and low energy.  He has a history of allergies and has experienced wheezing, particularly when exposed to allergens such as grass. 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 He has a history of mold exposure in his unfinished basement, which was addressed with a humidifier.  He has a history of elevated left diaphragm noted on a CT scan in June 2025, with no known history of trauma or surgery to the left side. He is scheduled to see a neck specialist due to arm pain and a narrow neck.   Patient is originally from [place], and previously worked an Paramedic  job for the [employer]. He denies any history of smoking or vape use. Denies any other occupational exposures.     Ancillary information including prior medications, full medical/surgical/family/social histories, and PFTs (when available) are listed below and have been reviewed.    Review of Systems  Constitutional:  Negative for chills, fever and weight loss.  Respiratory:  Positive for shortness of breath. Negative for cough, hemoptysis, sputum production and wheezing.   Cardiovascular:  Negative for chest pain.     Objective:   Vitals:   07/23/24 1432  BP: 110/64  Pulse: (!) 58   Temp: (!) 97.1 F (36.2 C)  SpO2: 97%  Weight: 185 lb 12.8 oz (84.3 kg)  Height: 5' 11 (1.803 m)   97% on RA BMI Readings from Last 3 Encounters:  07/23/24 25.91 kg/m  07/22/24 25.63 kg/m  05/02/24 26.19 kg/m   Wt Readings from Last 3 Encounters:  07/23/24 185 lb 12.8 oz (84.3 kg)  07/22/24 183 lb 12.8 oz (83.4 kg)  05/02/24 187 lb 12.8 oz (85.2 kg)    Physical Exam Constitutional:      Appearance: Normal appearance.  Cardiovascular:     Rate and Rhythm: Normal rate and regular rhythm.     Pulses: Normal pulses.     Heart sounds: Normal heart sounds.  Pulmonary:     Effort: Pulmonary effort is normal.     Breath sounds: No wheezing or rales.     Comments: Decreased air entry over the left lower lung field Neurological:     General: No focal deficit present.     Mental Status: He is alert and oriented to person, place, and time. Mental status is at baseline.       Ancillary Information    Past Medical History:  Diagnosis Date   Agatston coronary artery calcium  score greater than 400    a. 12/2015 Cardiac CT: Ca2+ score of 726 (91st %'ile).   Anxiety    Apical variant hypertrophic cardiomyopathy (HCC)    a. 01/2016 Echo: EF 55-60%, no rwma, mild AI, Ao root 39mm, mild MR, mod dil LA, mod TR, nl PASP; b. 12/2016 Cardiac MRI: EF 64%, sev apical hypertrophy up to 22mm in diastole. LGE in apical inf, lateral, and apical walls; c. 01/2018 Echo: EF 50-55%, gr2 DD. Mod-Sev apical hypertrophy. Mild AI. Mildly dil Ao root (3.8cm) and asc Ao (3.9cm). Sev dil LA. Nl RV fxn.   Dilated aortic root    a. 01/2016 Ao root 39mm; b. 12/2016 Cardiac MRI: Mild aneurysmal dil of Asc Ao - 42mm; c. 01/2018 Echo: dil Ao root (3.8cm) and asc Ao (3.9cm).   Hyperaldosteronism    Hypercholesteremia    Hypertension    Syncope    a. s/p MDT Linq.   Tremor      Family History  Problem Relation Age of Onset   Hypertension Mother    Heart attack Father    Hypertension Father    Spina bifida  Sister      Past Surgical History:  Procedure Laterality Date   CYSTOSCOPY WITH URETERAL STENT PLACEMENT; ROBOTIC LEFT PARTIAL NEPHRECTOMY Left 06/19/2024   HERNIA REPAIR     KNEE SURGERY     unknown which side   LOOP RECORDER INSERTION N/A 04/30/2017   Procedure: Loop Recorder Insertion;  Surgeon: Fernande Elspeth BROCKS, MD;  Location: [facility];  Service: Cardiovascular;  Laterality: N/A;   SKIN GRAFT      Social History   Socioeconomic History  Marital status: Married    Spouse name: Not on file   Number of children: 2   Years of education: some college   Highest education level: Not on file  Occupational History   Occupation: retired    Comment: Fish farm manager, Production designer, theatre/television/film of data center    Comment: pastor  Tobacco Use   Smoking status: Never   Smokeless tobacco: Never  Vaping Use   Vaping status: Never Used  Substance and Sexual Activity   Alcohol use: Yes    Alcohol/week: 5.0 standard drinks of alcohol    Types: 5 Glasses of wine per week    Comment: 1 per day   Drug use: Never   Sexual activity: Yes    Birth control/protection: None  Other Topics Concern   Not on file  Social History Narrative   Lives at home with his wife.   No daily use of caffeine.   Right-handed.   Social Drivers of Corporate investment banker Strain: Not on file  Food Insecurity: No Food Insecurity (06/19/2024)   Received from [facility]   Hunger Vital Sign    Within the past 12 months, you worried that your food would run out before you got the money to buy more.: Never true    Within the past 12 months, the food you bought just didn't last and you didn't have money to get more.: Never true  Transportation Needs: No Transportation Needs (06/19/2024)   Received from [facility]   Transportation    In the past 12 months, has lack of reliable transportation kept you from medical appointments, meetings, work or from getting things needed for daily living? : No  Physical  Activity: Not on file  Stress: Not on file  Social Connections: Not on file  Intimate Partner Violence: Not At Risk (06/19/2024)   Received from [facility]   Interpersonal Violence    Patient afraid of, threatened, hurt, or sexually abused by someone known to him/her: No     Allergies  Allergen Reactions   Molds & Smuts Other (See Comments)    Other Reaction(s): Other (See Comments), Other (See Comments)     CBC    Component Value Date/Time   WBC 6.6 03/05/2024 1016   WBC 7.3 08/31/2023 1000   RBC 5.65 03/05/2024 1016   HGB 17.0 03/05/2024 1016   HGB 16.8 09/09/2018 0932   HCT 49.5 03/05/2024 1016   HCT 47.9 09/09/2018 0932   PLT 122 (L) 03/05/2024 1016   PLT 188 09/09/2018 0932   MCV 87.6 03/05/2024 1016   MCV 84 09/09/2018 0932   MCH 30.1 03/05/2024 1016   MCHC 34.3 03/05/2024 1016   RDW 13.2 03/05/2024 1016   RDW 12.7 09/09/2018 0932   LYMPHSABS 1.5 03/05/2024 1016   LYMPHSABS 1.8 09/09/2018 0932   MONOABS 0.6 03/05/2024 1016   EOSABS 0.2 03/05/2024 1016   EOSABS 0.1 09/09/2018 0932   BASOSABS 0.1 03/05/2024 1016   BASOSABS 0.0 09/09/2018 0932    Pulmonary Functions Testing Results:     No data to display          Outpatient Medications Prior to Visit  Medication Sig Dispense Refill   allopurinol (ZYLOPRIM) 100 MG tablet Take 100 mg by mouth daily.     Cholecalciferol (VITAMIN D ) 2000 units tablet Take 2,000 Units by mouth daily.     Coenzyme Q10 (CO Q 10) 100 MG CAPS Take 1 capsule by mouth at bedtime.  ezetimibe  (ZETIA ) 10 MG tablet TAKE 1 TABLET BY MOUTH EVERY DAY 90 tablet 3   propranolol  (INDERAL ) 10 MG tablet Take 1 tablet (10 mg total) by mouth daily. 90 tablet 1   rosuvastatin  (CRESTOR ) 5 MG tablet TAKE 1 TABLET (5 MG TOTAL) BY MOUTH DAILY. 90 tablet 3   sildenafil (VIAGRA) 100 MG tablet Take 50-100 mg by mouth daily as needed.     spironolactone  (ALDACTONE ) 25 MG tablet TAKE 1 TABLET (25 MG TOTAL) BY MOUTH DAILY. 90 tablet 3    tamsulosin (FLOMAX) 0.4 MG CAPS capsule Take 0.4 mg by mouth at bedtime.     dabigatran (PRADAXA) 150 MG CAPS capsule Take 1 capsule (150 mg total) by mouth 2 (two) times daily. 60 capsule 0   colchicine  0.6 MG tablet Take 0.6 mg by mouth as needed. (Patient not taking: Reported on 07/23/2024)     fluticasone  (FLONASE ) 50 MCG/ACT nasal spray Place 1-2 sprays into both nostrils daily as needed (nasal congestion). (Patient not taking: Reported on 07/23/2024) 16 g 5   busPIRone (BUSPAR) 5 MG tablet Take 5 mg by mouth 2 (two) times daily. (Patient not taking: Reported on 05/02/2024)     No facility-administered medications prior to visit.

## 2024-07-23 NOTE — Patient Instructions (Signed)
 Please try to help us  obtain all the following scans:  CT scans of the chest PET scans MRI's of the neck

## 2024-07-24 ENCOUNTER — Telehealth: Payer: Self-pay | Admitting: Neurology

## 2024-07-24 NOTE — Telephone Encounter (Signed)
 Pt came in office. States he wants to see Dr. Onita to follow up on conditions with neck and shoulder he has seen her for before. States Dr. Onita told him he has some condition (could not remember the name) in his neck. He is currently seeing orthopedic dr who is trying to figure out his neck and shoulder pain. States he is constantly in pain. Pt got medical records so he could give to his orthopedic dr. Was hoping to see Dr. Onita sooner than next year to discuss treatment for his pain.

## 2024-07-28 NOTE — Telephone Encounter (Signed)
 Phone room: Please offer cancellation list and place pt on it to be seen sooner at high priority.

## 2024-07-28 NOTE — Telephone Encounter (Signed)
 Pt has been put on High Priority in the Wait List.

## 2024-07-29 ENCOUNTER — Inpatient Hospital Stay
Admission: RE | Admit: 2024-07-29 | Discharge: 2024-07-29 | Disposition: A | Discharge status: Home / Self Care | Payer: Self-pay | Source: Ambulatory Visit | Attending: Student in an Organized Health Care Education/Training Program | Admitting: Student in an Organized Health Care Education/Training Program

## 2024-07-29 ENCOUNTER — Telehealth: Payer: Self-pay

## 2024-07-29 DIAGNOSIS — I2699 Other pulmonary embolism without acute cor pulmonale: Secondary | ICD-10-CM

## 2024-07-29 DIAGNOSIS — R911 Solitary pulmonary nodule: Secondary | ICD-10-CM

## 2024-07-29 DIAGNOSIS — Z9289 Personal history of other medical treatment: Secondary | ICD-10-CM

## 2024-07-29 NOTE — Telephone Encounter (Signed)
 Received a disk with CT images from [facility] done on 07/08/2024.

## 2024-07-30 ENCOUNTER — Other Ambulatory Visit: Payer: Self-pay | Admitting: Orthopedic Surgery

## 2024-07-30 DIAGNOSIS — M542 Cervicalgia: Secondary | ICD-10-CM

## 2024-08-01 NOTE — Progress Notes (Unsigned)
 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 He had a bone marrow biopsy and aspirate performed recently that showed no concerning findings for MDS, MPN or acute leukemia.  PRIOR THERAPY: 1) status post partial nephrectomy (right) by Dr. Nemiah at [facility] on 06/19/24  CURRENT THERAPY:   INTERVAL HISTORY: Duane Warren Munster 68 y.o. male returns clinic today for follow-up visit accompanied by ***.  The patient was last seen in the clinic on 04/07/24.   The patient is followed by his history of suspicious myeloproliferative neoplasm as well as polycythemia and thrombocytopenia.  He also was found to have renal cell carcinoma and he is status post right partial nephrectomy by Dr. PIERRETTEAt ***.  Ask if they are following him with scans?  Ask when the next appointment is? ***Norleen Edelson and [facility]?  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 The tumor measures 11 cm.  pT2b. Pn was not assigned due to no nodes submitted.   Overall he tolerated surgery well.  He denies any major changes in his health since he was last seen except for ***  His last scan also showed a 5 mm nodule in the right lung which is too small to be detected by PET scan.  Was also lytic lesion at T4 that was not seen on PET scan but was seen on CT scan.  ***Determine if he needs Keytruda?  Regarding the cytopenias, the patient had already underwent a bone marrow biopsy.  Otherwise patient denies any recent  fever, chills, night sweats, or unexplained weight loss.  He denies any chest pain, shortness of breath, cough, or hemoptysis  Any unusual back pain, hematuria, or abdominal pain.  He denies any nausea, vomiting, diarrhea, or constipation.  He incidently had pulmonary embolism in the left main and left lower segmental pulmonary artery with no significant cardiac strain. He is on pradaxa. They recommended at least 6 months. Pulm mentioned Recent studies suggest benefits of extended anticoagulation in cancer-related embolism cases with reduced dose apixaban (2.5 mg bid) in patients with VTE and active malignancy.. They also recommended switching to eliquis.       MEDICAL HISTORY: Past Medical History:  Diagnosis Date   Agatston coronary artery calcium  score greater than 400    a. 12/2015 Cardiac CT: Ca2+ score of 726 (91st %'ile).   Anxiety    Apical variant hypertrophic cardiomyopathy (HCC)    a. 01/2016 Echo: EF 55-60%, no rwma, mild AI, Ao root 39mm, mild MR, mod dil LA, mod TR, nl PASP; b. 12/2016 Cardiac MRI: EF 64%, sev apical hypertrophy up to 22mm in diastole. LGE in apical inf, lateral, and apical walls; c. 01/2018 Echo: EF 50-55%, gr2 DD. Mod-Sev apical hypertrophy. Mild AI. Mildly dil Ao root (3.8cm) and asc Ao (3.9cm). Sev dil LA. Nl RV fxn.   Dilated aortic root    a. 01/2016 Ao root 39mm; b. 12/2016 Cardiac MRI: Mild aneurysmal dil of Asc Ao - 42mm; c. 01/2018 Echo: dil Ao root (3.8cm) and asc Ao (3.9cm).   Hyperaldosteronism  Hypercholesteremia    Hypertension    Syncope    a. s/p MDT Linq.   Tremor     ALLERGIES:  is allergic to molds & smuts.  MEDICATIONS:  Current Outpatient Medications  Medication Sig Dispense Refill   allopurinol (ZYLOPRIM) 100 MG tablet Take 100 mg by mouth daily.     apixaban (ELIQUIS) 5 MG TABS tablet Take 1 tablet (5 mg total) by mouth 2 (two) times daily. 60 tablet 11   Cholecalciferol (VITAMIN D ) 2000 units tablet Take 2,000 Units by mouth  daily.     Coenzyme Q10 (CO Q 10) 100 MG CAPS Take 1 capsule by mouth at bedtime.     colchicine  0.6 MG tablet Take 0.6 mg by mouth as needed. (Patient not taking: Reported on 07/23/2024)     ezetimibe  (ZETIA ) 10 MG tablet TAKE 1 TABLET BY MOUTH EVERY DAY 90 tablet 3   fluticasone  (FLONASE ) 50 MCG/ACT nasal spray Place 1-2 sprays into both nostrils daily as needed (nasal congestion). (Patient not taking: Reported on 07/23/2024) 16 g 5   propranolol  (INDERAL ) 10 MG tablet Take 1 tablet (10 mg total) by mouth daily. 90 tablet 1   rosuvastatin  (CRESTOR ) 5 MG tablet TAKE 1 TABLET (5 MG TOTAL) BY MOUTH DAILY. 90 tablet 3   sildenafil (VIAGRA) 100 MG tablet Take 50-100 mg by mouth daily as needed.     spironolactone  (ALDACTONE ) 25 MG tablet TAKE 1 TABLET (25 MG TOTAL) BY MOUTH DAILY. 90 tablet 3   tamsulosin (FLOMAX) 0.4 MG CAPS capsule Take 0.4 mg by mouth at bedtime.     No current facility-administered medications for this visit.    SURGICAL HISTORY:  Past Surgical History:  Procedure Laterality Date   CYSTOSCOPY WITH URETERAL STENT PLACEMENT; ROBOTIC LEFT PARTIAL NEPHRECTOMY Left 06/19/2024   HERNIA REPAIR     KNEE SURGERY     unknown which side   LOOP RECORDER INSERTION N/A 04/30/2017   Procedure: Loop Recorder Insertion;  Surgeon: Fernande Elspeth BROCKS, MD;  [facility]: [facility] INVASIVE CV LAB;  Service: Cardiovascular;  Laterality: N/A;   SKIN GRAFT      REVIEW OF SYSTEMS:   Review of Systems  Constitutional: Negative for appetite change, chills, fatigue, fever and unexpected weight change.  HENT:   Negative for mouth sores, nosebleeds, sore throat and trouble swallowing.   Eyes: Negative for eye problems and icterus.  Respiratory: Negative for cough, hemoptysis, shortness of breath and wheezing.   Cardiovascular: Negative for chest pain and leg swelling.  Gastrointestinal: Negative for abdominal pain, constipation, diarrhea, nausea and vomiting.  Genitourinary: Negative for bladder incontinence,  difficulty urinating, dysuria, frequency and hematuria.   Musculoskeletal: Negative for back pain, gait problem, neck pain and neck stiffness.  Skin: Negative for itching and rash.  Neurological: Negative for dizziness, extremity weakness, gait problem, headaches, light-headedness and seizures.  Hematological: Negative for adenopathy. Does not bruise/bleed easily.  Psychiatric/Behavioral: Negative for confusion, depression and sleep disturbance. The patient is not nervous/anxious.     PHYSICAL EXAMINATION:  There were no vitals taken for this visit.  ECOG PERFORMANCE STATUS: {CHL ONC ECOG [id number]  Physical Exam  Constitutional: Oriented to person, place, and time and well-developed, well-nourished, and in no distress. No distress.  HENT:  Head: Normocephalic and atraumatic.  Mouth/Throat: Oropharynx is clear and moist. No oropharyngeal exudate.  Eyes: Conjunctivae are normal. Right eye exhibits no discharge. Left eye exhibits no discharge. No scleral icterus.  Neck: Normal range of motion. Neck supple.  Cardiovascular: Normal  rate, regular rhythm, normal heart sounds and intact distal pulses.   Pulmonary/Chest: Effort normal and breath sounds normal. No respiratory distress. No wheezes. No rales.  Abdominal: Soft. Bowel sounds are normal. Exhibits no distension and no mass. There is no tenderness.  Musculoskeletal: Normal range of motion. Exhibits no edema.  Lymphadenopathy:    No cervical adenopathy.  Neurological: Alert and oriented to person, place, and time. Exhibits normal muscle tone. Gait normal. Coordination normal.  Skin: Skin is warm and dry. No rash noted. Not diaphoretic. No erythema. No pallor.  Psychiatric: Mood, memory and judgment normal.  Vitals reviewed.  LABORATORY DATA: Lab Results  Component Value Date   WBC 6.6 03/05/2024   HGB 17.0 03/05/2024   HCT 49.5 03/05/2024   MCV 87.6 03/05/2024   PLT 122 (L) 03/05/2024      Chemistry      Component  Value Date/Time   NA 142 04/28/2024 1534   K 4.1 04/28/2024 1534   CL 104 04/28/2024 1534   CO2 23 04/28/2024 1534   BUN 21 04/28/2024 1534   CREATININE 1.42 (H) 04/28/2024 1534   CREATININE 1.42 (H) 03/05/2024 1016      Component Value Date/Time   CALCIUM  9.2 04/28/2024 1534   ALKPHOS 60 03/05/2024 1016   AST 24 03/05/2024 1016   ALT 26 03/05/2024 1016   BILITOT 1.0 03/05/2024 1016       RADIOGRAPHIC STUDIES:  No results found.   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  This was staged as T2b, pN0 2) polycythemia as well as thrombocytopenia suspicious for myeloproliferative neoplasm with a rare DNMT3A p. 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 He had a bone marrow biopsy and aspirate performed that showed no concerning findings for MDS, MPN or acute leukemia.   The patient has status post a robot-assisted partial nephrectomy (left) on 9//25 by [facility].  The final pathology showed papillary renal cell carcinoma, high-grade with sarcomatoid features, and tumor necrosis.  The tumor measured 11 cm at the greatest dimension.  The margins are unremarkable.  The patient was seen by Dr. Sherrod today.  He had a repeat CBC performed today.  He also had a CMP.  His hemoglobin, platelets, white blood cell count is ***.   Regarding the kidney cancer, Dr. Sherrod recommends ***adjuvant treatment for a year?  With Keytruda?  ***Occasion?  Labs and follow-up in 6 months?  For the PE following surgery he will continue taking Eliquis.  ***Follow up   The patient was advised to call immediately if he has any concerning symptoms in the interval. The patient voices understanding of current disease status and treatment options and is in agreement with the current care  plan. All questions were answered. The patient knows to call the clinic with any problems, questions or concerns. We can certainly see the patient much sooner if necessary   No orders of the defined types were placed in this encounter.    I spent {CHL ONC TIME VISIT - DTPQU:[id number]} counseling the patient face to face. The total time spent in the appointment was {CHL ONC TIME VISIT - DTPQU:[id number]}.  Sarya Linenberger L Charell Faulk, PA-C 08/01/24

## 2024-08-04 ENCOUNTER — Telehealth: Payer: Self-pay | Admitting: Allergy

## 2024-08-04 NOTE — Telephone Encounter (Signed)
 Spoke with pt, we can disregard the PA. He is not using xhance , only using Flonase .

## 2024-08-04 NOTE — Telephone Encounter (Signed)
 Please call patient.  I received a letter regarding Xhance  that the PA will expire in 60 days.  Is this something he is still using? If yes, we will need to resubmit PA.   At the last visit, he said he was using Flonase .  Thank you.

## 2024-08-05 ENCOUNTER — Other Ambulatory Visit: Payer: Self-pay | Admitting: Internal Medicine

## 2024-08-05 DIAGNOSIS — D3502 Benign neoplasm of left adrenal gland: Secondary | ICD-10-CM

## 2024-08-06 ENCOUNTER — Inpatient Hospital Stay (HOSPITAL_BASED_OUTPATIENT_CLINIC_OR_DEPARTMENT_OTHER): Admitting: Physician Assistant

## 2024-08-06 ENCOUNTER — Inpatient Hospital Stay: Attending: Physician Assistant

## 2024-08-06 VITALS — BP 134/71 | HR 61 | Temp 97.4°F | Resp 13 | Wt 187.9 lb

## 2024-08-06 DIAGNOSIS — Z86718 Personal history of other venous thrombosis and embolism: Secondary | ICD-10-CM | POA: Insufficient documentation

## 2024-08-06 DIAGNOSIS — D3502 Benign neoplasm of left adrenal gland: Secondary | ICD-10-CM

## 2024-08-06 DIAGNOSIS — C642 Malignant neoplasm of left kidney, except renal pelvis: Secondary | ICD-10-CM | POA: Insufficient documentation

## 2024-08-06 DIAGNOSIS — C9200 Acute myeloblastic leukemia, not having achieved remission: Secondary | ICD-10-CM | POA: Insufficient documentation

## 2024-08-06 DIAGNOSIS — Z7901 Long term (current) use of anticoagulants: Secondary | ICD-10-CM | POA: Insufficient documentation

## 2024-08-06 DIAGNOSIS — Z905 Acquired absence of kidney: Secondary | ICD-10-CM | POA: Insufficient documentation

## 2024-08-06 DIAGNOSIS — D751 Secondary polycythemia: Secondary | ICD-10-CM | POA: Diagnosis not present

## 2024-08-06 DIAGNOSIS — D471 Chronic myeloproliferative disease: Secondary | ICD-10-CM | POA: Insufficient documentation

## 2024-08-06 DIAGNOSIS — Z79899 Other long term (current) drug therapy: Secondary | ICD-10-CM | POA: Insufficient documentation

## 2024-08-06 DIAGNOSIS — C641 Malignant neoplasm of right kidney, except renal pelvis: Secondary | ICD-10-CM | POA: Insufficient documentation

## 2024-08-06 DIAGNOSIS — D696 Thrombocytopenia, unspecified: Secondary | ICD-10-CM | POA: Insufficient documentation

## 2024-08-06 DIAGNOSIS — I422 Other hypertrophic cardiomyopathy: Secondary | ICD-10-CM | POA: Insufficient documentation

## 2024-08-06 DIAGNOSIS — I10 Essential (primary) hypertension: Secondary | ICD-10-CM | POA: Insufficient documentation

## 2024-08-06 DIAGNOSIS — I82409 Acute embolism and thrombosis of unspecified deep veins of unspecified lower extremity: Secondary | ICD-10-CM | POA: Insufficient documentation

## 2024-08-06 DIAGNOSIS — Z86711 Personal history of pulmonary embolism: Secondary | ICD-10-CM | POA: Insufficient documentation

## 2024-08-06 LAB — CBC WITH DIFFERENTIAL (CANCER CENTER ONLY)
Abs Immature Granulocytes: 0.01 K/uL (ref 0.00–0.07)
Basophils Absolute: 0.1 K/uL (ref 0.0–0.1)
Basophils Relative: 1 %
Eosinophils Absolute: 0.2 K/uL (ref 0.0–0.5)
Eosinophils Relative: 3 %
HCT: 47.4 % (ref 39.0–52.0)
Hemoglobin: 16.2 g/dL (ref 13.0–17.0)
Immature Granulocytes: 0 %
Lymphocytes Relative: 22 %
Lymphs Abs: 1.5 K/uL (ref 0.7–4.0)
MCH: 29.9 pg (ref 26.0–34.0)
MCHC: 34.2 g/dL (ref 30.0–36.0)
MCV: 87.5 fL (ref 80.0–100.0)
Monocytes Absolute: 0.6 K/uL (ref 0.1–1.0)
Monocytes Relative: 8 %
Neutro Abs: 4.7 K/uL (ref 1.7–7.7)
Neutrophils Relative %: 66 %
Platelet Count: 125 K/uL — ABNORMAL LOW (ref 150–400)
RBC: 5.42 MIL/uL (ref 4.22–5.81)
RDW: 13.5 % (ref 11.5–15.5)
WBC Count: 7.1 K/uL (ref 4.0–10.5)
nRBC: 0 % (ref 0.0–0.2)

## 2024-08-06 LAB — CMP (CANCER CENTER ONLY)
ALT: 15 U/L (ref 0–44)
AST: 16 U/L (ref 15–41)
Albumin: 4.1 g/dL (ref 3.5–5.0)
Alkaline Phosphatase: 69 U/L (ref 38–126)
Anion gap: 3 — ABNORMAL LOW (ref 5–15)
BUN: 22 mg/dL (ref 8–23)
CO2: 31 mmol/L (ref 22–32)
Calcium: 10.1 mg/dL (ref 8.9–10.3)
Chloride: 108 mmol/L (ref 98–111)
Creatinine: 1.39 mg/dL — ABNORMAL HIGH (ref 0.61–1.24)
GFR, Estimated: 55 mL/min — ABNORMAL LOW (ref >60)
Glucose, Bld: 112 mg/dL — ABNORMAL HIGH (ref 70–99)
Potassium: 4.5 mmol/L (ref 3.5–5.1)
Sodium: 142 mmol/L (ref 135–145)
Total Bilirubin: 0.9 mg/dL (ref 0.0–1.2)
Total Protein: 6.4 g/dL — ABNORMAL LOW (ref 6.5–8.1)

## 2024-08-06 LAB — LACTATE DEHYDROGENASE: LDH: 144 U/L (ref 98–192)

## 2024-08-06 NOTE — Patient Instructions (Signed)
 This is given in an IV every 3 week for 1 year (17 treatments) -We have to initiate this before 12 weeks after surgery.

## 2024-08-07 ENCOUNTER — Encounter: Payer: Self-pay | Admitting: Sleep Medicine

## 2024-08-07 ENCOUNTER — Ambulatory Visit: Admitting: Sleep Medicine

## 2024-08-07 VITALS — BP 110/70 | HR 58 | Temp 97.9°F | Ht 71.0 in | Wt 188.0 lb

## 2024-08-07 DIAGNOSIS — G4733 Obstructive sleep apnea (adult) (pediatric): Secondary | ICD-10-CM

## 2024-08-07 DIAGNOSIS — I10 Essential (primary) hypertension: Secondary | ICD-10-CM

## 2024-08-07 DIAGNOSIS — F5104 Psychophysiologic insomnia: Secondary | ICD-10-CM

## 2024-08-07 MED ORDER — TRAZODONE HCL 50 MG PO TABS: 50.0000 mg | ORAL_TABLET | Freq: Every day | ORAL | 3 refills | Status: DC

## 2024-08-07 NOTE — Patient Instructions (Addendum)
 SABRA

## 2024-08-07 NOTE — Progress Notes (Signed)
 Name:Duane Warren MRN: [id number] DOB: 10-05-1956   CHIEF COMPLAINT:  EXCESSIVE DAYTIME SLEEPINESS   HISTORY OF PRESENT ILLNESS: Duane Warren is a 68 y.o. w/ a h/o HTN and h/o PE who present for c/o loud snoring and excessive daytime sleepiness which has been present for several years. Reports nocturnal awakenings due to nocturia and has difficulty falling back to sleep. Reports a 10 lb weight loss. Denies morning headaches, RLS symptoms, dream enactment, cataplexy, hypnagogic or hypnapompic hallucinations. Denies a family history of sleep apnea. Denies drowsy driving. Drinks 1 glass of wine daily, denies tobacco or illicit drug use.   Bedtime 11 pm-12 am Sleep onset 30 mins Rise time 9-10 am   EPWORTH SLEEP SCORE 7    08/07/2024   12:00 PM  Results of the Epworth flowsheet  Sitting and reading 1  Watching TV 1  Sitting, inactive in a public place (e.g. a theatre or a meeting) 1  As a passenger in a car for an hour without a break 1  Lying down to rest in the afternoon when circumstances permit 2  Sitting and talking to someone 0  Sitting quietly after a lunch without alcohol 1  In a car, while stopped for a few minutes in traffic 0  Total score 7     PAST MEDICAL HISTORY :   has a past medical history of Agatston coronary artery calcium  score greater than 400, Anxiety, Apical variant hypertrophic cardiomyopathy (HCC), Dilated aortic root, Hyperaldosteronism, Hypercholesteremia, Hypertension, Syncope, and Tremor.  has a past surgical history that includes Hernia repair; Skin graft; Knee surgery; LOOP RECORDER INSERTION (N/A, 04/30/2017); and CYSTOSCOPY WITH URETERAL STENT PLACEMENT; ROBOTIC LEFT PARTIAL NEPHRECTOMY (Left, 06/19/2024). Prior to Admission medications   Medication Sig Start Date End Date Taking? Authorizing Provider  allopurinol (ZYLOPRIM) 100 MG tablet Take 100 mg by mouth daily.   Yes [provider]  apixaban (ELIQUIS) 5 MG TABS tablet  Take 1 tablet (5 mg total) by mouth 2 (two) times daily. 07/23/24 07/23/25 Yes Dgayli, Belva, MD  Cholecalciferol (VITAMIN D ) 2000 units tablet Take 2,000 Units by mouth daily.   Yes [provider]  Coenzyme Q10 (CO Q 10) 100 MG CAPS Take 1 capsule by mouth at bedtime.   Yes [provider]  ezetimibe  (ZETIA ) 10 MG tablet TAKE 1 TABLET BY MOUTH EVERY DAY 03/28/24  Yes Gollan, Timothy J, MD  propranolol  (INDERAL ) 10 MG tablet Take 1 tablet (10 mg total) by mouth daily. 04/11/24  Yes Furth, Cadence H, PA-C  rosuvastatin  (CRESTOR ) 5 MG tablet TAKE 1 TABLET (5 MG TOTAL) BY MOUTH DAILY. 03/28/24  Yes Gollan, Timothy J, MD  sildenafil (VIAGRA) 100 MG tablet Take 50-100 mg by mouth daily as needed. 08/19/22  Yes [provider]  spironolactone  (ALDACTONE ) 25 MG tablet TAKE 1 TABLET (25 MG TOTAL) BY MOUTH DAILY. 05/08/24  Yes Gollan, Timothy J, MD  tamsulosin (FLOMAX) 0.4 MG CAPS capsule Take 0.4 mg by mouth at bedtime. 08/05/23  Yes [provider]  valsartan  (DIOVAN ) 160 MG tablet Take 160 mg by mouth daily. 08/06/24  Yes [provider]  colchicine  0.6 MG tablet Take 0.6 mg by mouth as needed. Patient not taking: Reported on 08/07/2024    [provider]  fluticasone  (FLONASE ) 50 MCG/ACT nasal spray Place 1-2 sprays into both nostrils daily as needed (nasal congestion). Patient not taking: Reported on 08/07/2024 03/19/24   Luke Orlan HERO, DO   Allergies  Allergen Reactions  Sulfamethoxazole-Trimethoprim Other (See Comments) and Rash    Patient developed rash w/i 1hr of taking medication   Molds & Smuts Other (See Comments)    Other Reaction(s): Other (See Comments), Other (See Comments)   Sulfa Antibiotics Dermatitis    FAMILY HISTORY:  family history includes Heart attack in his father; Hypertension in his father and mother; Spina bifida in his sister. SOCIAL HISTORY:  reports that he has never smoked. He has never used smokeless tobacco. He reports  current alcohol use of about 5.0 standard drinks of alcohol per week. He reports that he does not use drugs.   Review of Systems:  Gen:  Denies  fever, sweats, chills weight loss  HEENT: Denies blurred vision, double vision, ear pain, eye pain, hearing loss, nose bleeds, sore throat Cardiac:  No dizziness, chest pain or heaviness, chest tightness,edema, No JVD Resp:   No cough, -sputum production, -shortness of breath,-wheezing, -hemoptysis,  Gi: Denies swallowing difficulty, stomach pain, nausea or vomiting, diarrhea, constipation, bowel incontinence Gu:  Denies bladder incontinence, burning urine Ext:   Denies Joint pain, stiffness or swelling Skin: Denies  skin rash, easy bruising or bleeding or hives Endoc:  Denies polyuria, polydipsia , polyphagia or weight change Psych:   Denies depression, insomnia or hallucinations  Other:  All other systems negative  VITAL SIGNS: BP 110/70   Pulse (!) 58   Temp 97.9 F (36.6 C)   Ht 5' 11 (1.803 m)   Wt 188 lb (85.3 kg)   SpO2 97%   BMI 26.22 kg/m    Physical Examination:   General Appearance: No distress  EYES PERRLA, EOM intact.   NECK Supple, No JVD Pulmonary: normal breath sounds, No wheezing.  CardiovascularNormal S1,S2.  No m/r/g.   Abdomen: Benign, Soft, non-tender. Skin:   warm, no rashes, no ecchymosis  Extremities: normal, no cyanosis, clubbing. Neuro:without focal findings,  speech normal  PSYCHIATRIC: Mood, affect within normal limits.   ASSESSMENT AND PLAN  OSA I suspect that OSA is likely present due to clinical presentation. Discussed the consequences of untreated sleep apnea. Advised not to drive drowsy for safety of patient and others. Will complete further evaluation with a home sleep study and follow up to review results.    HTN Stable, on current management. Following with PCP.   Insomnia Counseled patient on stimulus control and improving sleep hygiene practices. Will also try patient on Trazodone 25  mg nightly, advised patient to increase to 50 mg if needed.    MEDICATION ADJUSTMENTS/LABS AND TESTS ORDERED: Recommend Sleep Study   Patient  satisfied with Plan of action and management. All questions answered  Follow up to review HST results and treatment plan.   I spent a total of 42 minutes reviewing chart data, face-to-face evaluation with the patient, counseling and coordination of care as detailed above.    Petronella Shuford, M.D.  Sleep Medicine [facility]

## 2024-08-08 ENCOUNTER — Inpatient Hospital Stay
Admission: RE | Admit: 2024-08-08 | Discharge: 2024-08-08 | Disposition: A | Discharge status: Home / Self Care | Payer: Self-pay | Source: Ambulatory Visit | Attending: Student in an Organized Health Care Education/Training Program | Admitting: Student in an Organized Health Care Education/Training Program

## 2024-08-08 ENCOUNTER — Telehealth: Payer: Self-pay | Admitting: Internal Medicine

## 2024-08-08 ENCOUNTER — Other Ambulatory Visit: Payer: Self-pay

## 2024-08-08 DIAGNOSIS — Z9289 Personal history of other medical treatment: Secondary | ICD-10-CM

## 2024-08-08 NOTE — Telephone Encounter (Signed)
 Attempted to contact the patient but was unable to leave a voicemail. The patient is active on MyChart.

## 2024-08-24 ENCOUNTER — Encounter

## 2024-08-24 DIAGNOSIS — G4733 Obstructive sleep apnea (adult) (pediatric): Secondary | ICD-10-CM

## 2024-08-26 ENCOUNTER — Telehealth: Payer: Self-pay | Admitting: Student in an Organized Health Care Education/Training Program

## 2024-08-26 ENCOUNTER — Ambulatory Visit: Attending: Medical | Admitting: Medical

## 2024-08-26 ENCOUNTER — Encounter: Payer: Self-pay | Admitting: Medical

## 2024-08-26 VITALS — BP 140/80 | HR 60 | Ht 71.0 in | Wt 189.4 lb

## 2024-08-26 DIAGNOSIS — I10 Essential (primary) hypertension: Secondary | ICD-10-CM | POA: Diagnosis present

## 2024-08-26 DIAGNOSIS — I5022 Chronic systolic (congestive) heart failure: Secondary | ICD-10-CM | POA: Insufficient documentation

## 2024-08-26 DIAGNOSIS — I2699 Other pulmonary embolism without acute cor pulmonale: Secondary | ICD-10-CM | POA: Diagnosis present

## 2024-08-26 DIAGNOSIS — E782 Mixed hyperlipidemia: Secondary | ICD-10-CM | POA: Diagnosis present

## 2024-08-26 DIAGNOSIS — I422 Other hypertrophic cardiomyopathy: Secondary | ICD-10-CM | POA: Insufficient documentation

## 2024-08-26 DIAGNOSIS — Z79899 Other long term (current) drug therapy: Secondary | ICD-10-CM | POA: Insufficient documentation

## 2024-08-26 MED ORDER — SACUBITRIL-VALSARTAN 24-26 MG PO TABS: 1.0000 | ORAL_TABLET | Freq: Two times a day (BID) | ORAL | 3 refills | Status: DC

## 2024-08-26 NOTE — Telephone Encounter (Signed)
 Patient states having symptom of cough. Pharmacy is [facility]. Patient phone number is [phone].

## 2024-08-26 NOTE — Patient Instructions (Signed)
 Medication Instructions:  Your physician recommends the following medication changes.  STOP TAKING: Valsartan   START TAKING: Entresto 24-26 mg by mouth twice a day  *If you need a refill on your cardiac medications before your next appointment, please call your pharmacy*  Lab Work: Your provider would like for you to return in 2 weeks to have the following labs drawn: BMP.   Please go to [facility] [address] ([facility] [address], [place] [address] You do not need an appointment.  They are open from 8 am- 4:30 pm.  Lunch from 1:00 pm- 2:00 pm You will not need to be fasting.    Testing/Procedures: No test ordered today   Follow-Up: At [facility], you and your health needs are our priority.  As part of our continuing mission to provide you with exceptional heart care, our providers are all part of one team.  This team includes your primary Cardiologist (physician) and Advanced Practice Providers or APPs (Physician Assistants and Nurse Practitioners) who all work together to provide you with the care you need, when you need it.  Your next appointment:   1 month(s)  Provider:   Mikey Fishman, PA-C

## 2024-08-26 NOTE — Telephone Encounter (Signed)
 How long have your symptoms been going on for? Couple of weeks Any fevers, chills or sweats? No Any cough? If so are you getting anything up? What color? Dry cough.  Any SOB? No  Any wheezing? Occasional wheezing  What medications do you take? Has not taken anything for the cough.

## 2024-08-26 NOTE — Progress Notes (Addendum)
 Cardiology Office Note   Date:  08/26/2024  ID:  Duane Warren, DOB October 10, 1956, MRN [id number] PCP: Gerome Brunet, DO  [facility] Providers Cardiologist:  Elspeth Sage, MD (Inactive)     History of Present Illness Duane Warren is a 68 y.o. male  with a h/o gout, hernia repair, knee arthoscopy, primary aldosteronism, apical hypertrophic cardiomyopathy, NSVT, nonobstructive CAD, NSVT, mildly dilated ascending aorta 41mm, HFmrEF, PE on Eliquis  who presents for hospital follow-up.    Cardiac score was 726 in 2017. Echo in 2017 showed LVEF 55-60%, mod to sever apical hypertophy, mild MR/TR. Cardiac MRI in 2018 showed severe hypertrophy apical regions. He saw EP for ICD consideration and Dr. Sage felt it was not necessary and ILR was placed. He was noted to have run of NSVT, which was asymptomatic. Last interrogation was in 2022. Echo in 2019 showed low normal LVEF, G2DD, diastolic dysfunction, mod to severe apical hypertrophy. Echo in March 2024 showed LVEF 50-55%, moderately dilated left atrium, apical and periapical LVH. He has been on propranolol  for tremors.   Cardiac CTA 12/2023 showed mild nonobstructive CAD.   He was seen 04/11/24 reporting oncology found HR in the 40s. The patient reported heart rate normally 50-60s. EKG showed Hr 62bpm. Echo showed LVEF 55-60%, no WMA, moderately dilated LA, mild MR, mild AI, ascending aorta measuring 40mm.   The patient was admitted 06/2024 at [facility] [place]  for Acute PE. It was found incidentally on CT scan. He was started on Dabigatran . Echo showed LVEF 40-45%.   Patient was last seen 07/22/2024 denying chest pain or shortness of breath.  He reported low blood pressure with associated dizziness and lightheadedness.  amlodipine , hydrochlorothiazide  and valsartan  were stopped.  Propranolol  and spironolactone  were continued.  Today, the patient has been feeling Ok. He restrated valsartan  since BP was very elevated over the  weekend. He took amlodipine  once when BP was very high. He has occasional SOB. No chest pain or lower leg edema. He saw pulmonology who switched pradaxa  to Eliquis . He denies per-syncope or syncope.  Studies Reviewed     Echo 06/2024 Left Ventricle  There is normal left ventricular wall thickness. The left ventricle is mildly dilated. No evidence  of thrombus seen on this study. Ejection Fraction = 40-45%. There is borderline global hypokinesis  of the left ventricle.  Right Ventricle  The right ventricle is normal size. The right ventricular systolic function is normal. The right  ventricular wall motion is normal.  Atria  The left atrium is mildly dilated. Right atrial size is normal.  Mitral Valve  Mitral valve structure is normal. There is no evidence of mitral valve prolapse. There is no  mitral regurgitation noted. No mitral valve stenosis.  Tricuspid Valve  Anatomically normal tricuspid valve. There is no tricuspid valve prolapse. There is trace  tricuspid regurgitation. Estimated RVSP is mildly elevated at 35 mmHg. No evidence of tricuspid  stenosis.  Aortic Valve  The aortic valve appears structurally normal. The aortic valve is trileaflet. Mild to moderate  aortic regurgitation. There is no aortic stenosis.  Pulmonic Valve  No evidence of stenosis. There is no vegetation seen on the pulmonic valve. Mild pulmonic valvular  regurgitation.  Great Vessels  The aortic root is mildly dilated.  IVC  The inferior vena cava was not well visualized.  Pericardium  There is no significant pericardial effusion. There is no pleural effusion noted on this exam.    Echo 04/2024 1. Left ventricular ejection fraction,  by estimation, is 55 to 60%. The  left ventricle has normal function. The left ventricle has no regional  wall motion abnormalities. There is severe left ventricular hypertrophy of  the apical segment. Left  ventricular diastolic parameters are indeterminate.   2. Right  ventricular systolic function is normal. The right ventricular  size is normal.   3. Left atrial size was mild to moderately dilated.   4. The mitral valve is normal in structure. Mild mitral valve  regurgitation.   5. The aortic valve is tricuspid. Aortic valve regurgitation is mild.   6. Aortic dilatation noted. There is mild dilatation of the ascending  aorta, measuring 40 mm.   7. The inferior vena cava is normal in size with greater than 50%  respiratory variability, suggesting right atrial pressure of 3 mmHg.    Cardiac CTA 12/2023 IMPRESSION: 1. Coronary calcium  score of 2133. This was 95th percentile for age and sex matched control.   2. Normal coronary origin with right dominance.   3. Mild stenosis (25-49%) in LAD, RCA.   4. Minimal LM and LCx stenosis (<25%).   5. CAD-RADS 2. Mild non-obstructive CAD (25-49%). Consider non-atherosclerotic causes of chest pain. Consider preventive therapy and risk factor modification.     Echo 12/2022  1. Left ventricular ejection fraction, by estimation, is 50 to 55%. The  left ventricle has low normal function. The left ventricle has no regional  wall motion abnormalities. The left ventricular internal cavity size was  mildly dilated. There is moderate   concentric left ventricular hypertrophy. Left ventricular diastolic  parameters are consistent with Grade II diastolic dysfunction  (pseudonormalization).   2. Right ventricular systolic function is normal. The right ventricular  size is mildly enlarged. There is normal pulmonary artery systolic  pressure.   3. Left atrial size was moderately dilated.   4. The mitral valve is grossly normal. Trivial mitral valve  regurgitation. No evidence of mitral stenosis.   5. The aortic valve is grossly normal. There is mild calcification of the  aortic valve. There is mild thickening of the aortic valve. Aortic valve  regurgitation is mild. Aortic valve sclerosis is present, with no  evidence  of aortic valve stenosis.   6. Aortic dilatation noted. There is mild dilatation of the ascending  aorta, measuring 42 mm.   7. The inferior vena cava is normal in size with greater than 50%  respiratory variability, suggesting right atrial pressure of 3 mmHg.   Comparison(s): Changes from prior study are noted. Prior ascending aorta  3.9 cm, now 4.2 com.    cMRI 2018 FINDINGS: 1. Moderately dilated left ventricle with normal systolic function (LVEF = 64%).   There is severe hypertrophy of the apical segments measuring up to 22 mm in diastole. During systole there is a typical spade shape of the left ventricle. Basal portion of the left ventricle have only mild hypertrophy.   There is a late gadolinium enhancement present in the apical inferior, lateral walls and in the true apex.   LVEDD:  64 mm   LVESD:  40 mm   LVEDV:  163 ml   LVESV:  59 ml   SV:  104 ml   Myocardial mass:  196 g   2. Normal right ventricular size, thickness and systolic function (RVEF = 59%) with no regional wall motion abnormalities.   3.  Moderate left and right atrial dilatation.   4.  Mild mitral and moderate tricuspid regurgitation.   5. Normal size  of the aortic root measuring 38 mm. Mild aneurysmal dilatation of the ascending aorta measuring 42 mm. Mild dilatation of the pulmonary artery measuring 32 mm.   6.  Normal pericardium, no pericardial effusion.      Physical Exam VS:  BP (!) 140/80 (BP Location: Left Arm, Patient Position: Sitting, Cuff Size: Normal)   Pulse 60   Ht 5' 11 (1.803 m)   Wt 189 lb 6.4 oz (85.9 kg)   SpO2 99%   BMI 26.42 kg/m        Wt Readings from Last 3 Encounters:  08/26/24 189 lb 6.4 oz (85.9 kg)  08/07/24 188 lb (85.3 kg)  08/06/24 187 lb 14.4 oz (85.2 kg)    GEN: Well nourished, well developed in no acute distress NECK: No JVD; No carotid bruits CARDIAC: RRR, no murmurs, rubs, gallops RESPIRATORY:  Clear to auscultation without  rales, wheezing or rhonchi  ABDOMEN: Soft, non-tender, non-distended EXTREMITIES:  No edema; No deformity   ASSESSMENT AND PLAN  HFmrEF Echo showed EF of 40 to 45% in the setting of acute PE at an outside hospital.  Patient has a history of mild nonobstructive CAD on cardiac CTA earlier this year.  He denies any chest pain.  He has intermittent shortness of breath.  Continue spironolactone  and propranolol .  Amlodipine , hydrochlorothiazide , valsartan  were stopped for hypotension.  Patient had elevated blood pressure over the weekend and restarted valsartan .  I will stop valsartan  and start Entresto  24-26 mg twice daily.  Plan to start SGLT2 inhibitor at follow-up.  We will recheck an echo in 2 to 3 months.  If EF is still low, may consider ischemic evaluation.  Acute PE He saw pulmonology who changed Pradaxa  to Eliquis .  They recommended 6 months of Eliquis .  Continue Eliquis  5 mg twice daily x 6 months.  HTN Hypotension resolved with stopping amlodipine , hydrochlorothiazide , and valsartan .  He was continued on propranolol  and spironolactone .  Blood pressure was elevated over the weekend and he restarted valsartan .  He took amlodipine  once.  Blood pressure today is 140/80.  LVH-apical variant Cardiac MRI in 2018 showed severe LVH of apical segment.  He was seen by EP for ICD consideration.  He saw Dr. Fernande, who did not feel ICD was indicated.  Patient denies presyncope or syncope.  Hyperlipidemia LDL 39 in 2024.  Continue Crestor  and Zetia .       Dispo: Follow-up in 1 month  Signed, Keah Lamba VEAR Fishman, PA-C

## 2024-08-27 NOTE — Telephone Encounter (Signed)
 Patient was returning call and says he will not be available he will be out of town for a funeral please reach back out to patient

## 2024-08-27 NOTE — Telephone Encounter (Signed)
 LMTCB. E2C2 please advise when patient calls back.

## 2024-08-29 ENCOUNTER — Other Ambulatory Visit (HOSPITAL_COMMUNITY): Payer: Self-pay

## 2024-08-29 ENCOUNTER — Telehealth: Payer: Self-pay | Admitting: Cardiovascular Disease

## 2024-08-29 ENCOUNTER — Other Ambulatory Visit: Payer: Self-pay

## 2024-08-29 ENCOUNTER — Other Ambulatory Visit: Payer: Self-pay | Admitting: Sleep Medicine

## 2024-08-29 MED ORDER — SACUBITRIL-VALSARTAN 24-26 MG PO TABS
1.0000 | ORAL_TABLET | Freq: Two times a day (BID) | ORAL | 3 refills | Status: AC
  Filled 2024-08-29 – 2024-09-26 (×5): qty 60, 30d supply, fill #0
  Filled 2024-09-26 – 2024-10-23 (×2): qty 60, 30d supply, fill #1

## 2024-08-29 NOTE — Telephone Encounter (Signed)
 I spoke with the patient and scheduled him an appt for 11/19 at 11:15am with Dr. Isadora.  Nothing further needed.

## 2024-08-29 NOTE — Telephone Encounter (Signed)
 Called patient and notified him that Prior Authorization had been approved. Prescription sent to preferred pharmacy.

## 2024-08-29 NOTE — Telephone Encounter (Signed)
 Pt c/o medication issue:  1. Name of Medication:   sacubitril-valsartan  (ENTRESTO) 24-26 MG    2. How are you currently taking this medication (dosage and times per day)?    3. Are you having a reaction (difficulty breathing--STAT)? no  4. What is your medication issue? Patient can't afford medication, calling to see if it would be okay for him to take the generic brand. Please advise

## 2024-08-29 NOTE — Addendum Note (Signed)
 Addended by: CRISTOPHER OLIVIA PARAS on: 08/29/2024 03:11 PM   Modules accepted: Orders

## 2024-08-29 NOTE — Telephone Encounter (Signed)
 Pharmacy Patient Advocate Encounter  Received notification from [facility] that Prior Authorization for ENTRESTO has been APPROVED from 08/29/24 to 08/30/27. Ran test claim, Copay is $10. This test claim was processed through [facility]- copay amounts may vary at other pharmacies due to pharmacy/plan contracts, or as the patient moves through the different stages of their insurance plan.

## 2024-09-01 DIAGNOSIS — R0683 Snoring: Secondary | ICD-10-CM | POA: Diagnosis not present

## 2024-09-03 ENCOUNTER — Ambulatory Visit: Admitting: Student in an Organized Health Care Education/Training Program

## 2024-09-03 ENCOUNTER — Ambulatory Visit (INDEPENDENT_AMBULATORY_CARE_PROVIDER_SITE_OTHER): Admitting: Sleep Medicine

## 2024-09-03 ENCOUNTER — Encounter: Payer: Self-pay | Admitting: Student in an Organized Health Care Education/Training Program

## 2024-09-03 ENCOUNTER — Encounter: Payer: Self-pay | Admitting: Sleep Medicine

## 2024-09-03 VITALS — BP 120/76 | HR 64 | Temp 97.6°F | Ht 71.0 in | Wt 186.8 lb

## 2024-09-03 DIAGNOSIS — C642 Malignant neoplasm of left kidney, except renal pelvis: Secondary | ICD-10-CM | POA: Diagnosis not present

## 2024-09-03 DIAGNOSIS — I2699 Other pulmonary embolism without acute cor pulmonale: Secondary | ICD-10-CM | POA: Diagnosis not present

## 2024-09-03 DIAGNOSIS — J301 Allergic rhinitis due to pollen: Secondary | ICD-10-CM | POA: Diagnosis not present

## 2024-09-03 DIAGNOSIS — R053 Chronic cough: Secondary | ICD-10-CM

## 2024-09-03 DIAGNOSIS — I10 Essential (primary) hypertension: Secondary | ICD-10-CM | POA: Diagnosis not present

## 2024-09-03 DIAGNOSIS — G4733 Obstructive sleep apnea (adult) (pediatric): Secondary | ICD-10-CM | POA: Diagnosis not present

## 2024-09-03 MED ORDER — FLUTICASONE-SALMETEROL 100-50 MCG/ACT IN AEPB: 1.0000 | INHALATION_SPRAY | Freq: Two times a day (BID) | RESPIRATORY_TRACT | 3 refills | Status: DC

## 2024-09-03 MED ORDER — LORATADINE 10 MG PO TABS: 10.0000 mg | ORAL_TABLET | Freq: Every day | ORAL | 6 refills | Status: AC

## 2024-09-03 NOTE — Patient Instructions (Signed)
  VISIT SUMMARY: During your visit, we discussed your intermittent cough and wheezing, your ongoing treatment for pulmonary embolism, and the surveillance of your pulmonary nodule and renal cell carcinoma. We have made some adjustments to your medications and planned further evaluations to better manage your symptoms.  YOUR PLAN: -CHRONIC COUGH WITH WHEEZING: Your intermittent cough and wheezing may be due to asthma. We have prescribed Claritin to help with nasal stuffiness and ordered a breathing test to evaluate for asthma. You should use the Wixela inhaler twice daily and rinse your mouth after each use. Continue using the albuterol  inhaler as needed until we get the test results. If Claritin does not help, start using the Vexela inhaler. We will follow up in three months after the breathing test.  -PULMONARY EMBOLISM ON ANTICOAGULATION THERAPY: A pulmonary embolism is a blood clot in the lungs. You are currently managing this with Eliquis, and there have been no bleeding issues. You should continue taking Eliquis as prescribed for at least six months, and we will reevaluate your need for anticoagulation therapy in the future.  -RIGHT LOWER LOBE PULMONARY NODULE UNDER SURVEILLANCE: A pulmonary nodule is a small growth in the lung. We will continue to monitor this with regular scans every three months to ensure it does not change or grow.  -HISTORY OF RENAL CELL CARCINOMA STATUS POST PARTIAL NEPHRECTOMY: Renal cell carcinoma is a type of kidney cancer. You had a partial nephrectomy to remove part of your kidney. We will continue to monitor your condition with regular scans to ensure there is no recurrence.  INSTRUCTIONS: Please follow up in three months after your breathing test. Continue taking your medications as prescribed and attend all scheduled scans and follow-up appointments.

## 2024-09-03 NOTE — Patient Instructions (Signed)

## 2024-09-03 NOTE — Progress Notes (Signed)
 Name:Duane Warren MRN: [id number] DOB: 1956/03/25   CHIEF COMPLAINT:  CPAP F/U   HISTORY OF PRESENT ILLNESS: Mr. Duane Warren is a 68 y.o. w/ a h/o HTN and h/o PE who presents to follow up on HST results. The patient underwent HST which revealed severe OSA (AHI 52, O2 nadir 85%).    EPWORTH SLEEP SCORE 7    08/07/2024   12:00 PM  Results of the Epworth flowsheet  Sitting and reading 1  Watching TV 1  Sitting, inactive in a public place (e.g. a theatre or a meeting) 1  As a passenger in a car for an hour without a break 1  Lying down to rest in the afternoon when circumstances permit 2  Sitting and talking to someone 0  Sitting quietly after a lunch without alcohol 1  In a car, while stopped for a few minutes in traffic 0  Total score 7     PAST MEDICAL HISTORY :   has a past medical history of [place]at[place] coronary artery calcium  score greater than 400, Anxiety, Apical variant hypertrophic cardiomyopathy (HCC), Dilated aortic root, Hyperaldosteronism, Hypercholesteremia, Hypertension, Syncope, and Tremor.  has a past surgical history that includes Hernia repair; Skin graft; Knee surgery; LOOP RECORDER INSERTION (N/A, 04/30/2017); and CYSTOSCOPY WITH URETERAL STENT PLACEMENT; ROBOTIC LEFT PARTIAL NEPHRECTOMY (Left, 06/19/2024). Prior to Admission medications   Medication Sig Start Date End Date Taking? Authorizing Provider  allopurinol (ZYLOPRIM) 100 MG tablet Take 100 mg by mouth daily.   Yes [provider]  apixaban (ELIQUIS) 5 MG TABS tablet Take 1 tablet (5 mg total) by mouth 2 (two) times daily. 07/23/24 07/23/25 Yes Dgayli, Belva, MD  Cholecalciferol (VITAMIN D ) 2000 units tablet Take 2,000 Units by mouth daily.   Yes [provider]  Coenzyme Q10 (CO Q 10) 100 MG CAPS Take 1 capsule by mouth at bedtime.   Yes [provider]  ezetimibe  (ZETIA ) 10 MG tablet TAKE 1 TABLET BY MOUTH EVERY DAY 03/28/24  Yes Gollan, Timothy J, MD  propranolol   (INDERAL ) 10 MG tablet Take 1 tablet (10 mg total) by mouth daily. 04/11/24  Yes Furth, Cadence H, PA-C  rosuvastatin  (CRESTOR ) 5 MG tablet TAKE 1 TABLET (5 MG TOTAL) BY MOUTH DAILY. 03/28/24  Yes Gollan, Timothy J, MD  sildenafil (VIAGRA) 100 MG tablet Take 50-100 mg by mouth daily as needed. 08/19/22  Yes [provider]  spironolactone  (ALDACTONE ) 25 MG tablet TAKE 1 TABLET (25 MG TOTAL) BY MOUTH DAILY. 05/08/24  Yes Gollan, Timothy J, MD  tamsulosin (FLOMAX) 0.4 MG CAPS capsule Take 0.4 mg by mouth at bedtime. 08/05/23  Yes [provider]  valsartan  (DIOVAN ) 160 MG tablet Take 160 mg by mouth daily. 08/06/24  Yes [provider]  colchicine  0.6 MG tablet Take 0.6 mg by mouth as needed. Patient not taking: Reported on 08/07/2024    [provider]  fluticasone  (FLONASE ) 50 MCG/ACT nasal spray Place 1-2 sprays into both nostrils daily as needed (nasal congestion). Patient not taking: Reported on 08/07/2024 03/19/24   Luke Orlan HERO, DO   Allergies  Allergen Reactions   Sulfamethoxazole-Trimethoprim Other (See Comments) and Rash    Patient developed rash w/i 1hr of taking medication   Molds & Smuts Other (See Comments)    Other Reaction(s): Other (See Comments), Other (See Comments)   Sulfa Antibiotics Dermatitis    FAMILY HISTORY:  family history includes Heart attack in his father; Hypertension in his father and mother;  Spina bifida in his sister. SOCIAL HISTORY:  reports that he has never smoked. He has never used smokeless tobacco. He reports current alcohol use of about 5.0 standard drinks of alcohol per week. He reports that he does not use drugs.   Review of Systems:  Gen:  Denies  fever, sweats, chills weight loss  HEENT: Denies blurred vision, double vision, ear pain, eye pain, hearing loss, nose bleeds, sore throat Cardiac:  No dizziness, chest pain or heaviness, chest tightness,edema, No JVD Resp:   No cough, -sputum production, -shortness of  breath,-wheezing, -hemoptysis,  Gi: Denies swallowing difficulty, stomach pain, nausea or vomiting, diarrhea, constipation, bowel incontinence Gu:  Denies bladder incontinence, burning urine Ext:   Denies Joint pain, stiffness or swelling Skin: Denies  skin rash, easy bruising or bleeding or hives Endoc:  Denies polyuria, polydipsia , polyphagia or weight change Psych:   Denies depression, insomnia or hallucinations  Other:  All other systems negative  VITAL SIGNS: BP 120/76   Pulse 64   Temp 97.6 F (36.4 C) (Temporal)   Ht 5' 11 (1.803 m)   Wt 186 lb 12.8 oz (84.7 kg)   SpO2 97%   BMI 26.05 kg/m     Physical Examination:   General Appearance: No distress  EYES PERRLA, EOM intact.   NECK Supple, No JVD Pulmonary: normal breath sounds, No wheezing.  CardiovascularNormal S1,S2.  No m/r/g.   Abdomen: Benign, Soft, non-tender. Skin:   warm, no rashes, no ecchymosis  Extremities: normal, no cyanosis, clubbing. Neuro:without focal findings,  speech normal  PSYCHIATRIC: Mood, affect within normal limits.   ASSESSMENT AND PLAN  OSA Reviewed HST results with patient. Starting on APAP therapy set to 4-16 cm H2O. Discussed the consequences of untreated sleep apnea. Advised not to drive drowsy for safety of patient and others. Will follow up in 3 months.     HTN Stable, on current management. Following with PCP.    Patient  satisfied with Plan of action and management. All questions answered  I spent a total of 34 minutes reviewing chart data, face-to-face evaluation with the patient, counseling and coordination of care as detailed above.    Duane Warren, M.D.  Sleep Medicine [facility]

## 2024-09-03 NOTE — Progress Notes (Signed)
 Assessment & Plan:   Assessment & Plan  #Chronic cough with wheezing  Intermittent cough with wheeze, no sputum. Differential includes asthma. No association with Entresto or pembrolizumab (not started) so doubt any pneumonitis. Does report seasonal allergies and UACS is possible. Lung exam is clear today with wheezing. Will attempt second generation anti-histamine for UACS, and if not improvement trial ICS/LABA. Will also obtain PFT's to assess for obstruction. Reflux associated cough is also on the differential.  - Prescribed Claritin, one daily for nasal stuffiness. - Ordered breathing test for asthma evaluation. - loratadine (CLARITIN) 10 MG tablet; Take 1 tablet (10 mg total) by mouth daily.  Dispense: 30 tablet; Refill: 6 - fluticasone -salmeterol (WIXELA INHUB) 100-50 MCG/ACT AEPB; Inhale 1 puff into the lungs 2 (two) times daily.  Dispense: 60 each; Refill: 3 - Advised albuterol  inhaler as needed until test results. - Instructed to start inhaler only if Claritin ineffective. - Scheduled follow-up in three months post-breathing test.  #Pulmonary embolism on anticoagulation therapy  Pulmonary embolism managed with Eliquis. No bleeding issues. Anticoagulation for at least six months, reevaluation post cancer-free status.  An acute pulmonary embolism was identified incidentally post-surgery, involving the left main and left lower segmental pulmonary artery, with no significant cardiac strain on echocardiogram. This is in the acute post surgical setting as well as in the setting of active malignancy. Initially managed with Pradaxa, switched to Eliquis. Anticoagulation therapy is necessary for at least six months post remission from malignancy, with potential extension based on cancer evaluation and treatment outcomes. Recent studies suggest benefits of extended anticoagulation in cancer-related embolism cases with reduced dose apixaban (2.5 mg bid) in patients with VTE and active  malignancy.   - Continue Eliquis as prescribed. - Plan for at least six months of anticoagulation therapy.  #Right lower lobe pulmonary nodule under surveillance  A small right lower lobe pulmonary nodule, approximately 4 mm, was identified on a CT scan in June and noted to be stable on repeat imaging in September of 2025. This requires ongoing surveillance to monitor for changes. Will obtain and compare CT images from [facility] to assess nodule stability. Plan for continued surveillance of the pulmonary nodule, potentially in conjunction with other cancer-related imaging. Will likely get a repeat CT in 6 months if he doesn't require a chest CT for other reasons.  - Continue surveillance with regular scans every three months.   Return in about 3 months (around 12/04/2024).  Belva November, MD [facility]  I spent 30 minutes caring for this patient today, including preparing to see the patient, obtaining a medical history , reviewing a separately obtained history, performing a medically appropriate examination and/or evaluation, counseling and educating the patient/family/caregiver, ordering medications, tests, or procedures, documenting clinical information in the electronic health record, and independently interpreting results (not separately reported/billed) and communicating results to the patient/family/caregiver  End of visit medications:  Meds ordered this encounter  Medications   loratadine (CLARITIN) 10 MG tablet    Sig: Take 1 tablet (10 mg total) by mouth daily.    Dispense:  30 tablet    Refill:  6   fluticasone -salmeterol (WIXELA INHUB) 100-50 MCG/ACT AEPB    Sig: Inhale 1 puff into the lungs 2 (two) times daily.    Dispense:  60 each    Refill:  3     Current Outpatient Medications:    allopurinol (ZYLOPRIM) 100 MG tablet, Take 100 mg by mouth daily., Disp: , Rfl:  apixaban (ELIQUIS) 5 MG TABS tablet, Take 1 tablet (5 mg total) by  mouth 2 (two) times daily., Disp: 60 tablet, Rfl: 11   Cholecalciferol (VITAMIN D ) 2000 units tablet, Take 2,000 Units by mouth daily., Disp: , Rfl:    Coenzyme Q10 (CO Q 10) 100 MG CAPS, Take 1 capsule by mouth at bedtime., Disp: , Rfl:    ezetimibe  (ZETIA ) 10 MG tablet, TAKE 1 TABLET BY MOUTH EVERY DAY, Disp: 90 tablet, Rfl: 3   fluticasone  (FLONASE ) 50 MCG/ACT nasal spray, Place 1-2 sprays into both nostrils daily as needed (nasal congestion)., Disp: 16 g, Rfl: 5   fluticasone -salmeterol (WIXELA INHUB) 100-50 MCG/ACT AEPB, Inhale 1 puff into the lungs 2 (two) times daily., Disp: 60 each, Rfl: 3   loratadine (CLARITIN) 10 MG tablet, Take 1 tablet (10 mg total) by mouth daily., Disp: 30 tablet, Rfl: 6   propranolol  (INDERAL ) 10 MG tablet, Take 1 tablet (10 mg total) by mouth daily., Disp: 90 tablet, Rfl: 1   rosuvastatin  (CRESTOR ) 5 MG tablet, TAKE 1 TABLET (5 MG TOTAL) BY MOUTH DAILY., Disp: 90 tablet, Rfl: 3   sacubitril-valsartan  (ENTRESTO) 24-26 MG, Take 1 tablet by mouth 2 (two) times daily., Disp: 180 tablet, Rfl: 3   sildenafil (VIAGRA) 100 MG tablet, Take 50-100 mg by mouth daily as needed., Disp: , Rfl:    spironolactone  (ALDACTONE ) 25 MG tablet, TAKE 1 TABLET (25 MG TOTAL) BY MOUTH DAILY., Disp: 90 tablet, Rfl: 3   tamsulosin (FLOMAX) 0.4 MG CAPS capsule, Take 0.4 mg by mouth at bedtime., Disp: , Rfl:    traZODone (DESYREL) 50 MG tablet, TAKE 1 TABLET BY MOUTH EVERYDAY AT BEDTIME, Disp: 90 tablet, Rfl: 2   Subjective:   PATIENT ID: Duane Warren GENDER: male DOB: [birth year], MRN: [id number]  Chief Complaint  Patient presents with   Medical Management of Chronic Issues    Cough and wheezing. Shortness of breath on exertion and occasional at rest.     HPI  Discussed the use of AI scribe software for clinical note transcription with the patient, who gave verbal consent to proceed.  History of Present Illness Duane Warren is a 68 year old male with renal cell  carcinoma and pulmonary embolism who presents with cough and wheeze.  Initial Visit 07/23/2024:  He was diagnosed with a left-sided pulmonary embolism on July 08, 2024, following a CT scan of the chest that revealed involvement of the left main pulmonary artery and left lower segmental pulmonary artery. He was treated at the [facility] with heparin infusion, followed by Lovenox, and was discharged on Pradaxa 150 mg twice daily. His echocardiogram while hospitalized showed normal RV function without signs of RV strain or RV pressure overload. He was asymptomatic, and this was incidentally discovered on a follow up CT after surgery.   He has a history of a left renal mass concerning for renal cell carcinoma, first detected on May of 2025. He was seen by oncology (Dr. Sherrod) and referred to urology. He sought a second opinion at [facility] as well as at [facility] with urology. A biopsy was performed of the left renal mass confirming malignancy, and he underwent a robotic partial nephrectomy on June 19, 2024. Previous CT scans of the chest from June of 2025 had shown a sub-centimeter RLL pulmonary nodule which was reported as stable on repeat chest CT 07/08/2024. CT imaging was also noted for a chronically elevated left hemidiaphragm.   He experiences intermittent  shortness of breath, which has been present for months, and occasional wheezing, particularly noted around May 2025. He has a history of using albuterol  inhalers, which he feels are ineffective. No significant respiratory symptoms at present, such as consistent shortness of breath or chest tightness.   His past medical history includes coronary artery disease, hypertrophic cardiomyopathy, benign prostatic hyperplasia, and prediabetes. He has experienced low blood pressure post-surgery, leading to medication adjustments. He reports significant weight loss post-surgery and a history of fatigue and low energy.   He has  a history of allergies and has experienced wheezing, particularly when exposed to allergens such as grass. 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 He has a history of mold exposure in his unfinished basement, which was addressed with a humidifier.   He has a history of elevated left diaphragm noted on a CT scan in June 2025, with no known history of trauma or surgery to the left side. He is scheduled to see a neck specialist due to arm pain and a narrow neck.   Return Visit 09/03/2024:  He has a raspy, non-productive cough that is intermittent, without associated fever. He experiences sporadic nasal drainage and uses a nasal spray occasionally, which provides some relief. He has not taken any allergy  medications for the runny nose.  He has a history of renal cell carcinoma and underwent a partial nephrectomy. He is currently under oncology follow-up and was offered pembrolizumab but has not started it. He was recently switched to Entresto and has not had any issues with it. No other new medications noted.  He developed a pulmonary embolism post-nephrectomy and was initially on Pradaxa, which was recently switched to Eliquis. He reports no issues with Eliquis aside from the cost and denies any bleeding.  He has a history of wheezing that has been present for years but is not consistent. He has never been diagnosed with asthma. He uses an albuterol  inhaler but is unsure of its effectiveness. He has a stuffy nose for which he uses an occasional nasal spray.  He underwent a home sleep study which indicated significant sleep apnea. He is unsure if this is related to his wheezing.  Patient is originally from [place], and previously worked an paramedic job for the [employer]. He denies any history of smoking or vape use. Denies any other occupational exposures.   Ancillary information including prior medications, full  medical/surgical/family/social histories, and PFTs (when available) are listed below and have been reviewed.    Review of Systems  Constitutional:  Negative for chills, fever and weight loss.  Respiratory:  Positive for cough and wheezing. Negative for hemoptysis, sputum production and shortness of breath.   Cardiovascular:  Negative for chest pain.     Objective:   Vitals:   09/03/24 1031  BP: 120/76  Pulse: 64  Temp: 97.6 F (36.4 C)  TempSrc: Temporal  SpO2: 97%  Weight: 186 lb 12.8 oz (84.7 kg)  Height: 5' 11 (1.803 m)   97% on RA  BMI Readings from Last 3 Encounters:  09/03/24 26.05 kg/m  08/26/24 26.42 kg/m  08/07/24 26.22 kg/m   Wt Readings from Last 3 Encounters:  09/03/24 186 lb 12.8 oz (84.7 kg)  08/26/24 189 lb 6.4 oz (85.9 kg)  08/07/24 188 lb (85.3 kg)    Physical Exam Constitutional:      Appearance: Normal appearance.  Cardiovascular:     Rate and Rhythm: Normal rate  and regular rhythm.     Pulses: Normal pulses.     Heart sounds: Normal heart sounds.  Pulmonary:     Effort: Pulmonary effort is normal.     Breath sounds: Normal breath sounds.  Neurological:     General: No focal deficit present.     Mental Status: He is alert and oriented to person, place, and time. Mental status is at baseline.       Ancillary Information    Past Medical History:  Diagnosis Date   Agatston coronary artery calcium  score greater than 400    a. 12/2015 Cardiac CT: Ca2+ score of 726 (91st %'ile).   Anxiety    Apical variant hypertrophic cardiomyopathy (HCC)    a. 01/2016 Echo: EF 55-60%, no rwma, mild AI, Ao root 39mm, mild MR, mod dil LA, mod TR, nl PASP; b. 12/2016 Cardiac MRI: EF 64%, sev apical hypertrophy up to 22mm in diastole. LGE in apical inf, lateral, and apical walls; c. 01/2018 Echo: EF 50-55%, gr2 DD. Mod-Sev apical hypertrophy. Mild AI. Mildly dil Ao root (3.8cm) and asc Ao (3.9cm). Sev dil LA. Nl RV fxn.   Dilated aortic root    a. 01/2016 Ao  root 39mm; b. 12/2016 Cardiac MRI: Mild aneurysmal dil of Asc Ao - 42mm; c. 01/2018 Echo: dil Ao root (3.8cm) and asc Ao (3.9cm).   Hyperaldosteronism    Hypercholesteremia    Hypertension    Syncope    a. s/p MDT Linq.   Tremor      Family History  Problem Relation Age of Onset   Hypertension Mother    Heart attack Father    Hypertension Father    Spina bifida Sister      Past Surgical History:  Procedure Laterality Date   CYSTOSCOPY WITH URETERAL STENT PLACEMENT; ROBOTIC LEFT PARTIAL NEPHRECTOMY Left 06/19/2024   HERNIA REPAIR     KNEE SURGERY     unknown which side   LOOP RECORDER INSERTION N/A 04/30/2017   Procedure: Loop Recorder Insertion;  Surgeon: Fernande Elspeth BROCKS, MD;  Location: [facility];  Service: Cardiovascular;  Laterality: N/A;   SKIN GRAFT      Social History   Socioeconomic History   Marital status: Married    Spouse name: Not on file   Number of children: 2   Years of education: some college   Highest education level: Not on file  Occupational History   Occupation: retired    Comment: Retail Buyer, production designer, theatre/television/film of data center    Comment: pastor  Tobacco Use   Smoking status: Never   Smokeless tobacco: Never  Vaping Use   Vaping status: Never Used  Substance and Sexual Activity   Alcohol use: Yes    Alcohol/week: 5.0 standard drinks of alcohol    Types: 5 Glasses of wine per week    Comment: 1 per day   Drug use: Never   Sexual activity: Yes    Birth control/protection: None  Other Topics Concern   Not on file  Social History Narrative   Lives at home with his wife.   No daily use of caffeine.   Right-handed.   Social Drivers of Corporate Investment Banker Strain: Not on file  Food Insecurity: No Food Insecurity (06/19/2024)   Received from [facility]   Hunger Vital Sign    Within the past 12 months, you worried that your food would run out before you got the money to buy more.: Never true  Within the past 12 months, the  food you bought just didn't last and you didn't have money to get more.: Never true  Transportation Needs: No Transportation Needs (06/19/2024)   Received from [facility]   Transportation    In the past 12 months, has lack of reliable transportation kept you from medical appointments, meetings, work or from getting things needed for daily living? : No  Physical Activity: Not on file  Stress: Not on file  Social Connections: Not on file  Intimate Partner Violence: Not At Risk (06/19/2024)   Received from [facility]   Interpersonal Violence    Patient afraid of, threatened, hurt, or sexually abused by someone known to him/her: No     Allergies  Allergen Reactions   Sulfamethoxazole-Trimethoprim Other (See Comments) and Rash    Patient developed rash w/i 1hr of taking medication   Molds & Smuts Other (See Comments)    Other Reaction(s): Other (See Comments), Other (See Comments)   Sulfa Antibiotics Dermatitis     CBC    Component Value Date/Time   WBC 7.1 08/06/2024 1102   WBC 7.3 08/31/2023 1000   RBC 5.42 08/06/2024 1102   HGB 16.2 08/06/2024 1102   HGB 16.8 09/09/2018 0932   HCT 47.4 08/06/2024 1102   HCT 47.9 09/09/2018 0932   PLT 125 (L) 08/06/2024 1102   PLT 188 09/09/2018 0932   MCV 87.5 08/06/2024 1102   MCV 84 09/09/2018 0932   MCH 29.9 08/06/2024 1102   MCHC 34.2 08/06/2024 1102   RDW 13.5 08/06/2024 1102   RDW 12.7 09/09/2018 0932   LYMPHSABS 1.5 08/06/2024 1102   LYMPHSABS 1.8 09/09/2018 0932   MONOABS 0.6 08/06/2024 1102   EOSABS 0.2 08/06/2024 1102   EOSABS 0.1 09/09/2018 0932   BASOSABS 0.1 08/06/2024 1102   BASOSABS 0.0 09/09/2018 0932    Pulmonary Functions Testing Results:     No data to display          Outpatient Medications Prior to Visit  Medication Sig Dispense Refill   allopurinol (ZYLOPRIM) 100 MG tablet Take 100 mg by mouth daily.     apixaban (ELIQUIS) 5 MG TABS tablet Take 1 tablet (5 mg total) by mouth 2 (two)  times daily. 60 tablet 11   Cholecalciferol (VITAMIN D ) 2000 units tablet Take 2,000 Units by mouth daily.     Coenzyme Q10 (CO Q 10) 100 MG CAPS Take 1 capsule by mouth at bedtime.     ezetimibe  (ZETIA ) 10 MG tablet TAKE 1 TABLET BY MOUTH EVERY DAY 90 tablet 3   fluticasone  (FLONASE ) 50 MCG/ACT nasal spray Place 1-2 sprays into both nostrils daily as needed (nasal congestion). 16 g 5   propranolol  (INDERAL ) 10 MG tablet Take 1 tablet (10 mg total) by mouth daily. 90 tablet 1   rosuvastatin  (CRESTOR ) 5 MG tablet TAKE 1 TABLET (5 MG TOTAL) BY MOUTH DAILY. 90 tablet 3   sacubitril-valsartan  (ENTRESTO) 24-26 MG Take 1 tablet by mouth 2 (two) times daily. 180 tablet 3   sildenafil (VIAGRA) 100 MG tablet Take 50-100 mg by mouth daily as needed.     spironolactone  (ALDACTONE ) 25 MG tablet TAKE 1 TABLET (25 MG TOTAL) BY MOUTH DAILY. 90 tablet 3   tamsulosin (FLOMAX) 0.4 MG CAPS capsule Take 0.4 mg by mouth at bedtime.     traZODone (DESYREL) 50 MG tablet TAKE 1 TABLET BY MOUTH EVERYDAY AT BEDTIME 90 tablet 2   No facility-administered medications prior to visit.

## 2024-09-04 ENCOUNTER — Ambulatory Visit: Payer: Self-pay | Admitting: Medical

## 2024-09-04 DIAGNOSIS — R7989 Other specified abnormal findings of blood chemistry: Secondary | ICD-10-CM

## 2024-09-04 LAB — BASIC METABOLIC PANEL WITH GFR
BUN/Creatinine Ratio: 14 (ref 10–24)
BUN: 20 mg/dL (ref 8–27)
CO2: 26 mmol/L (ref 20–29)
Calcium: 9.7 mg/dL (ref 8.6–10.2)
Chloride: 107 mmol/L — ABNORMAL HIGH (ref 96–106)
Creatinine, Ser: 1.45 mg/dL — ABNORMAL HIGH (ref 0.76–1.27)
Glucose: 110 mg/dL — ABNORMAL HIGH (ref 70–99)
Potassium: 4.9 mmol/L (ref 3.5–5.2)
Sodium: 145 mmol/L — ABNORMAL HIGH (ref 134–144)
eGFR: 52 mL/min/1.73 — ABNORMAL LOW (ref >59)

## 2024-09-16 ENCOUNTER — Ambulatory Visit: Admitting: Sleep Medicine

## 2024-09-26 ENCOUNTER — Other Ambulatory Visit: Payer: Self-pay

## 2024-09-26 ENCOUNTER — Other Ambulatory Visit (HOSPITAL_COMMUNITY): Payer: Self-pay

## 2024-09-29 ENCOUNTER — Ambulatory Visit: Admitting: Medical

## 2024-09-29 ENCOUNTER — Ambulatory Visit: Admitting: Neurology

## 2024-09-29 ENCOUNTER — Encounter: Payer: Self-pay | Admitting: Neurology

## 2024-09-29 VITALS — BP 130/81 | HR 61 | Ht 71.0 in | Wt 188.8 lb

## 2024-09-29 DIAGNOSIS — R413 Other amnesia: Secondary | ICD-10-CM | POA: Diagnosis not present

## 2024-09-29 DIAGNOSIS — R251 Tremor, unspecified: Secondary | ICD-10-CM | POA: Diagnosis not present

## 2024-09-29 NOTE — Progress Notes (Signed)
 Chief Complaint  Patient presents with   Follow-up    Pt in room 14. Alone. Here for tremor/neck follow up. Pt now using cpap machine managed by Pulmonary.       ASSESSMENT AND PLAN  Duane Warren is a 68 y.o. 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  MRI cervical in Sept 2018. 1. Mid and lower cervical disc degeneration as described. 2. C5-6 moderate left foraminal stenosis. 3. C6-7 right paracentral disc protrusion with right C7 impingement and mild right ventral cord flattening. 4. 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  MRI brain in July 2021 1. No acute intracranial abnormality. 2. Small, old left cerebellar infarct.   Laboratory evaluation in July 2021: LDL 56, mild elevated creatinine 1.34, GFR 55   HISTORICAL  Duane Warren,is a 68 year old male, seen in request by his primary care doctor collins, [place] for evaluation of tremor, initial evaluation was January 13, 2021, he is accompanied by his wife at today's visit.  I reviewed and summarized the referring note.PMHX. HTN HLD S/p Loop record in 2018 for dizziness, no abnormality found.  He has a known history of right cervical spondylosis, present with right cervical radiculopathy in 2018, MRI of cervical spine then showed multilevel degenerative changes, most noticeable at C6-7 level, right paracentral disc protrusion with right C7 impingement, mild right ventral cord flattening, with treatment, his right arm radicular pain has much improved, but still has  intermittent right more than left hand muscle cramping, he denies gait abnormality, has urinary frequency, but no incontinence, no persistent upper extremity sensory loss or weakness  Over the past few years, he also developed gradual onset mild bilateral hands tremor, most noticeable at right dominant hand, when he working on small objects, more of a nuisance, there was no limitation in his activity.  UPDATE Dec 14 2022: He continues to work as a education officer, environmental at [employer], had 1 presyncope episode a month ago, around 11:30 AM, while in the pulpit, standing for 25 minutes delivering message, he suddenly felt lightheaded, vision turned to dark, he did not loss consciousness, able to quickly finish his sentence, sat down, symptoms gradually resolved after drinking some water, eating his breakfast bar, he missed his breakfast that morning.  He was seen by cardiologist in the past for slow heart rate, today heart rate was 56, frequent PVCs, previously had loop recorder, lost its function in 2023,  Recent laboratory evaluation by [facility], A1c was mildly elevated 6.1,  Since the event, he was seen by his cardiologist Dr. Perla on October 31, 2022, made adjustment on his blood pressure medication, stop amlodipine   atherosclerotic disease of bilateral carotid artery, low-grade stenosis, coronary artery disease involving native vessel, elevated CT coronary calcium  score, apical thickening, moderate to severe by echocardiogram in 2021,  Still noticed bilateral hand action tremor, no significant worsening,  UPDATE Sept 24 2024: He is overall doing well, but there was 2 episode, he get up quickly from seated position, felt mildly unsteady,  Recent laboratory evaluation showed  mild elevation of hemoglobin 17, was seen by hematologist,  Propranolol  as needed did help his hand tremor  UPDATE Sep 29 2024 He is using CPAP now, sleeps better, his neck and shoulder pain have improved, went through  PT,  Incidental finding of left renal mess, s/p partial nephretomy with cystoscope and ureteral stent placement, following that he developed PE, now on Eliquis ,  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  PHYSICAL EXAM   Vitals:   09/29/24 1122  BP: 130/81  Pulse: 61  Weight: 188 lb 12.8 oz (85.6 kg)  Height: 5' 11 (1.803 m)   Body mass index is 26.33 kg/m.  PHYSICAL EXAMNIATION:  Gen: NAD, conversant, well nourised, well groomed                     Cardiovascular: Irregular rate rhythm, no peripheral edema, warm, nontender. Eyes: Conjunctivae clear without exudates or hemorrhage Neck: Supple, no carotid bruits. Pulmonary: Clear to auscultation bilaterally   NEUROLOGICAL EXAM:  MENTAL STATUS: Speech/cognition: Awake, alert, oriented to history taking and care conversation    09/29/2024   11:00 AM  Montreal Cognitive Assessment   Visuospatial/ Executive (0/5) 4  Naming (0/3) 3  Attention: Read list of digits (0/2) 2  Attention: Read list of letters (0/1) 1  Attention: Serial 7 subtraction starting at 100 (0/3) 2  Language: Repeat phrase (0/2) 2  Language : Fluency (0/1) 1  Abstraction (0/2) 2  Delayed Recall (0/5) 3  Orientation (0/6) 6  Total 26      CRANIAL NERVES: CN II: Visual fields are full to confrontation. Pupils are round equal and briskly reactive to light. CN III, IV, VI: extraocular movement are normal. No ptosis. CN V: Facial sensation is intact to light touch CN VII: Face is symmetric with normal eye closure  CN VIII: Hearing is normal to causal conversation. CN IX, X: Phonation is normal. CN XI: Head turning and shoulder shrug are intact  MOTOR: He has mild  bilateral hand posturing tremor, no weakness, no rigidity no bradykinesia, Mild difficulty drawing spiral circle, left worse than right  REFLEXES: Reflexes are 2+ and symmetric at the biceps, triceps, 3/3 knees, and ankles. Plantar responses are flexor.   SENSORY: Intact to light touch, pinprick and vibratory sensation are intact in fingers and toes.  COORDINATION: There is no trunk or limb dysmetria noted.  GAIT/STANCE: He can get up from seated position arm crossed, steady  ALLERGIES: Allergies  Allergen Reactions   Sulfamethoxazole-Trimethoprim Other (See Comments) and Rash    Patient developed rash w/i 1hr of taking medication   Molds & Smuts Other (See Comments)    Other Reaction(s): Other (See Comments), Other (See Comments)   Sulfa Antibiotics Dermatitis    HOME MEDICATIONS: Current Outpatient Medications  Medication Sig Dispense Refill   allopurinol (ZYLOPRIM) 100 MG tablet Take 100 mg by mouth daily.     apixaban  (ELIQUIS ) 5 MG TABS tablet Take 1 tablet (5 mg total) by mouth 2 (two) times daily. 60 tablet 11   Cholecalciferol (VITAMIN D ) 2000 units tablet Take 2,000 Units by mouth daily.     Coenzyme Q10 (CO Q 10) 100 MG CAPS Take 1 capsule by mouth at bedtime.  ezetimibe  (ZETIA ) 10 MG tablet TAKE 1 TABLET BY MOUTH EVERY DAY 90 tablet 3   fluticasone  (FLONASE ) 50 MCG/ACT nasal spray Place 1-2 sprays into both nostrils daily as needed (nasal congestion). 16 g 5   loratadine  (CLARITIN ) 10 MG tablet Take 1 tablet (10 mg total) by mouth daily. 30 tablet 6   propranolol  (INDERAL ) 10 MG tablet Take 1 tablet (10 mg total) by mouth daily. 90 tablet 1   rosuvastatin  (CRESTOR ) 5 MG tablet TAKE 1 TABLET (5 MG TOTAL) BY MOUTH DAILY. 90 tablet 3   sacubitril -valsartan  (ENTRESTO ) 24-26 MG Take 1 tablet by mouth 2 (two) times daily. 180 tablet 3   sildenafil (VIAGRA) 100 MG tablet Take 50-100 mg by mouth daily as needed.     spironolactone  (ALDACTONE ) 25 MG tablet TAKE 1 TABLET (25  MG TOTAL) BY MOUTH DAILY. 90 tablet 3   tamsulosin (FLOMAX) 0.4 MG CAPS capsule Take 0.4 mg by mouth at bedtime.     traZODone  (DESYREL ) 50 MG tablet TAKE 1 TABLET BY MOUTH EVERYDAY AT BEDTIME 90 tablet 2   fluticasone -salmeterol (WIXELA INHUB) 100-50 MCG/ACT AEPB Inhale 1 puff into the lungs 2 (two) times daily. (Patient not taking: Reported on 09/29/2024) 60 each 3   No current facility-administered medications for this visit.    PAST MEDICAL HISTORY: Past Medical History:  Diagnosis Date   Agatston coronary artery calcium  score greater than 400    a. 12/2015 Cardiac CT: Ca2+ score of 726 (91st %'ile).   Anxiety    Apical variant hypertrophic cardiomyopathy (HCC)    a. 01/2016 Echo: EF 55-60%, no rwma, mild AI, Ao root 39mm, mild MR, mod dil LA, mod TR, nl PASP; b. 12/2016 Cardiac MRI: EF 64%, sev apical hypertrophy up to 22mm in diastole. LGE in apical inf, lateral, and apical walls; c. 01/2018 Echo: EF 50-55%, gr2 DD. Mod-Sev apical hypertrophy. Mild AI. Mildly dil Ao root (3.8cm) and asc Ao (3.9cm). Sev dil LA. Nl RV fxn.   Dilated aortic root    a. 01/2016 Ao root 39mm; b. 12/2016 Cardiac MRI: Mild aneurysmal dil of Asc Ao - 42mm; c. 01/2018 Echo: dil Ao root (3.8cm) and asc Ao (3.9cm).   Hyperaldosteronism    Hypercholesteremia    Hypertension    Syncope    a. s/p MDT Linq.   Tremor     PAST SURGICAL HISTORY: Past Surgical History:  Procedure Laterality Date   CYSTOSCOPY WITH URETERAL STENT PLACEMENT; ROBOTIC LEFT PARTIAL NEPHRECTOMY Left 06/19/2024   HERNIA REPAIR     KNEE SURGERY     unknown which side   LOOP RECORDER INSERTION N/A 04/30/2017   Procedure: Loop Recorder Insertion;  Surgeon: Fernande Elspeth BROCKS, MD;  Location: [facility] INVASIVE CV LAB;  Service: Cardiovascular;  Laterality: N/A;   SKIN GRAFT      FAMILY HISTORY: Family History  Problem Relation Age of Onset   Hypertension Mother    Heart attack Father    Hypertension Father    Spina bifida Sister     SOCIAL  HISTORY: Social History   Socioeconomic History   Marital status: Married    Spouse name: Not on file   Number of children: 2   Years of education: some college   Highest education level: Not on file  Occupational History   Occupation: retired    Comment: Nurse, [employer], production designer, theatre/television/film of data center    Comment: pastor  Tobacco Use   Smoking status: Never   Smokeless tobacco: Never  Advertising Account Planner  Vaping status: Never Used  Substance and Sexual Activity   Alcohol use: Yes    Alcohol/week: 5.0 standard drinks of alcohol    Types: 5 Glasses of wine per week    Comment: 1 per day   Drug use: Never   Sexual activity: Yes    Birth control/protection: None  Other Topics Concern   Not on file  Social History Narrative   Lives at home with his wife.   No daily use of caffeine.   Right-handed.   Social Drivers of Health   Tobacco Use: Low Risk (09/29/2024)   Patient History    Smoking Tobacco Use: Never    Smokeless Tobacco Use: Never    Passive Exposure: Not on file  Financial Resource Strain: Not on file  Food Insecurity: No Food Insecurity (06/19/2024)   Received from [facility]   Epic    Within the past 12 months, you worried that your food would run out before you got the money to buy more.: Never true    Within the past 12 months, the food you bought just didn't last and you didn't have money to get more.: Never true  Transportation Needs: No Transportation Needs (06/19/2024)   Received from [facility]   Transportation    In the past 12 months, has lack of reliable transportation kept you from medical appointments, meetings, work or from getting things needed for daily living? : No  Physical Activity: Not on file  Stress: Not on file  Social Connections: Not on file  Intimate Partner Violence: Not At Risk (06/19/2024)   Received from [facility]   Interpersonal Violence    Patient afraid of, threatened, hurt, or sexually abused by someone known to  him/her: No  Depression (PHQ2-9): Not on file  Alcohol Screen: Not on file  Housing: Unknown (06/19/2024)   Received from [facility]   Epic    Unable to Pay for Housing in the Last Year: Not on file    Number of Times Moved in the Last Year: Not on file    In the last 12 months, was there a time when you did not have a steady place to sleep or slept in a shelter (including now)?: No  Utilities: Not At Risk (06/19/2024)   Received from [facility]   Epic    In the past 12 months has the electric, gas, oil, or water company threatened to shut off services in your home?: No  Health Literacy: Not on file      Modena Callander, M.D. Ph.D.  [facility] [address] [place], [place] [address] Ph: [phone] Fax: [phone]  CC:  Gerome Brunet, DO [address] [place],  [place] [address]  Gerome Brunet, DO

## 2024-09-30 ENCOUNTER — Ambulatory Visit: Attending: Medical | Admitting: Medical

## 2024-09-30 ENCOUNTER — Encounter: Payer: Self-pay | Admitting: Medical

## 2024-09-30 VITALS — BP 100/68 | HR 70 | Ht 71.0 in | Wt 188.4 lb

## 2024-09-30 DIAGNOSIS — I2699 Other pulmonary embolism without acute cor pulmonale: Secondary | ICD-10-CM | POA: Diagnosis not present

## 2024-09-30 DIAGNOSIS — E782 Mixed hyperlipidemia: Secondary | ICD-10-CM | POA: Diagnosis present

## 2024-09-30 DIAGNOSIS — I5022 Chronic systolic (congestive) heart failure: Secondary | ICD-10-CM

## 2024-09-30 DIAGNOSIS — Z79899 Other long term (current) drug therapy: Secondary | ICD-10-CM | POA: Diagnosis present

## 2024-09-30 DIAGNOSIS — I422 Other hypertrophic cardiomyopathy: Secondary | ICD-10-CM | POA: Insufficient documentation

## 2024-09-30 DIAGNOSIS — I10 Essential (primary) hypertension: Secondary | ICD-10-CM | POA: Diagnosis not present

## 2024-09-30 MED ORDER — EMPAGLIFLOZIN 10 MG PO TABS: 10.0000 mg | ORAL_TABLET | Freq: Every day | ORAL | 3 refills | Status: AC

## 2024-09-30 NOTE — Progress Notes (Signed)
 Cardiology Office Note   Date:  09/30/2024  ID:  GINA COSTILLA, DOB 28-Aug-1956, MRN [id number] PCP: Gerome Brunet, DO  [facility] Providers Cardiologist:  Elspeth Sage, MD (Inactive)   History of Present Illness Duane Warren is a 68 y.o. male with a h/o gout, hernia repair, knee arthoscopy, primary aldosteronism, apical hypertrophic cardiomyopathy, NSVT, nonobstructive CAD, NSVT, mildly dilated ascending aorta 41mm, renal cell carcinoma, OSA on CPAP, HFmrEF, PE on Eliquis  who presents for follow-up of CHF.    Cardiac score was 726 in 2017. Echo in 2017 showed LVEF 55-60%, mod to sever apical hypertophy, mild MR/TR. Cardiac MRI in 2018 showed severe hypertrophy apical regions. He saw EP for ICD consideration and Dr. Sage felt it was not necessary and ILR was placed. He was noted to have run of NSVT, which was asymptomatic. Last interrogation was in 2022. Echo in 2019 showed low normal LVEF, G2DD, diastolic dysfunction, mod to severe apical hypertrophy. Echo in March 2024 showed LVEF 50-55%, moderately dilated left atrium, apical and periapical LVH. He has been on propranolol  for tremors.   Cardiac CTA 12/2023 showed mild nonobstructive CAD.   He was seen 04/11/24 reporting oncology found HR in the 40s. The patient reported heart rate normally 50-60s. EKG showed Hr 62bpm. Echo showed LVEF 55-60%, no WMA, moderately dilated LA, mild MR, mild AI, ascending aorta measuring 40mm.   He had surgery sept 4 [place] He was found to have a mass on his kidney that was malignant and he underwent s/p partial nephrectomy on the left. He was diagnosed with renal cell carcinoma. He will not be doing radiation of chemotherapy.    The patient was admitted later that month (06/2024) at [facility]  for Acute PE. It was found incidentally on CT scan. He was started on Dabigatran . Echo showed LVEF 40-45%. He was later switched to Eliquis .    The patient was last seen  08/26/2024 and was overall feeling okay from a cardiac perspective.  Patient reported elevated blood pressure and valsartan  was stopped, and he was started on Entresto .  Today, the patient reports blood pressures at home are still high, up to 140/80s. Says he rarely gets low numbers. Today, blood pressures on our check is normal, repeat 112/88. He reports compliance with medications. He says he checks BP 4 times throughout the day, but not at the same time each day. He started a CPAP machine. He has rare chest pain after eating. He has mild SOB on exertion. No lower leg edema, lightheadedness or dizziness.   Studies Reviewed      Echo 06/2024 Left Ventricle  There is normal left ventricular wall thickness. The left ventricle is mildly dilated. No evidence  of thrombus seen on this study. Ejection Fraction = 40-45%. There is borderline global hypokinesis  of the left ventricle.  Right Ventricle  The right ventricle is normal size. The right ventricular systolic function is normal. The right  ventricular wall motion is normal.  Atria  The left atrium is mildly dilated. Right atrial size is normal.  Mitral Valve  Mitral valve structure is normal. There is no evidence of mitral valve prolapse. There is no  mitral regurgitation noted. No mitral valve stenosis.  Tricuspid Valve  Anatomically normal tricuspid valve. There is no tricuspid valve prolapse. There is trace  tricuspid regurgitation. Estimated RVSP is mildly elevated at 35 mmHg. No evidence of tricuspid  stenosis.  Aortic Valve  The aortic valve appears structurally normal.  The aortic valve is trileaflet. Mild to moderate  aortic regurgitation. There is no aortic stenosis.  Pulmonic Valve  No evidence of stenosis. There is no vegetation seen on the pulmonic valve. Mild pulmonic valvular  regurgitation.  Great Vessels  The aortic root is mildly dilated.  IVC  The inferior vena cava was not well visualized.  Pericardium  There is  no significant pericardial effusion. There is no pleural effusion noted on this exam.    Echo 04/2024 1. Left ventricular ejection fraction, by estimation, is 55 to 60%. The  left ventricle has normal function. The left ventricle has no regional  wall motion abnormalities. There is severe left ventricular hypertrophy of  the apical segment. Left  ventricular diastolic parameters are indeterminate.   2. Right ventricular systolic function is normal. The right ventricular  size is normal.   3. Left atrial size was mild to moderately dilated.   4. The mitral valve is normal in structure. Mild mitral valve  regurgitation.   5. The aortic valve is tricuspid. Aortic valve regurgitation is mild.   6. Aortic dilatation noted. There is mild dilatation of the ascending  aorta, measuring 40 mm.   7. The inferior vena cava is normal in size with greater than 50%  respiratory variability, suggesting right atrial pressure of 3 mmHg.    Cardiac CTA 12/2023 IMPRESSION: 1. Coronary calcium  score of 2133. This was 95th percentile for age and sex matched control.   2. Normal coronary origin with right dominance.   3. Mild stenosis (25-49%) in LAD, RCA.   4. Minimal LM and LCx stenosis (<25%).   5. CAD-RADS 2. Mild non-obstructive CAD (25-49%). Consider non-atherosclerotic causes of chest pain. Consider preventive therapy and risk factor modification.     Echo 12/2022  1. Left ventricular ejection fraction, by estimation, is 50 to 55%. The  left ventricle has low normal function. The left ventricle has no regional  wall motion abnormalities. The left ventricular internal cavity size was  mildly dilated. There is moderate   concentric left ventricular hypertrophy. Left ventricular diastolic  parameters are consistent with Grade II diastolic dysfunction  (pseudonormalization).   2. Right ventricular systolic function is normal. The right ventricular  size is mildly enlarged. There is normal  pulmonary artery systolic  pressure.   3. Left atrial size was moderately dilated.   4. The mitral valve is grossly normal. Trivial mitral valve  regurgitation. No evidence of mitral stenosis.   5. The aortic valve is grossly normal. There is mild calcification of the  aortic valve. There is mild thickening of the aortic valve. Aortic valve  regurgitation is mild. Aortic valve sclerosis is present, with no evidence  of aortic valve stenosis.   6. Aortic dilatation noted. There is mild dilatation of the ascending  aorta, measuring 42 mm.   7. The inferior vena cava is normal in size with greater than 50%  respiratory variability, suggesting right atrial pressure of 3 mmHg.   Comparison(s): Changes from prior study are noted. Prior ascending aorta  3.9 cm, now 4.2 com.    cMRI 2018 FINDINGS: 1. Moderately dilated left ventricle with normal systolic function (LVEF = 64%).   There is severe hypertrophy of the apical segments measuring up to 22 mm in diastole. During systole there is a typical spade shape of the left ventricle. Basal portion of the left ventricle have only mild hypertrophy.   There is a late gadolinium enhancement present in the apical inferior, lateral walls  and in the true apex.   LVEDD:  64 mm   LVESD:  40 mm   LVEDV:  163 ml   LVESV:  59 ml   SV:  104 ml   Myocardial mass:  196 g   2. Normal right ventricular size, thickness and systolic function (RVEF = 59%) with no regional wall motion abnormalities.   3.  Moderate left and right atrial dilatation.   4.  Mild mitral and moderate tricuspid regurgitation.   5. Normal size of the aortic root measuring 38 mm. Mild aneurysmal dilatation of the ascending aorta measuring 42 mm. Mild dilatation of the pulmonary artery measuring 32 mm.   6.  Normal pericardium, no pericardial effusion.     Physical Exam VS:  BP 100/68 (BP Location: Left Arm, Patient Position: Sitting, Cuff Size: Normal)   Pulse 70    Ht 5' 11 (1.803 m)   Wt 188 lb 6.4 oz (85.5 kg)   SpO2 98%   BMI 26.28 kg/m        Wt Readings from Last 3 Encounters:  09/30/24 188 lb 6.4 oz (85.5 kg)  09/29/24 188 lb 12.8 oz (85.6 kg)  09/03/24 186 lb 12.8 oz (84.7 kg)    GEN: Well nourished, well developed in no acute distress NECK: No JVD; No carotid bruits CARDIAC: RRR, no murmurs, rubs, gallops RESPIRATORY:  Clear to auscultation without rales, wheezing or rhonchi  ABDOMEN: Soft, non-tender, non-distended EXTREMITIES:  No edema; No deformity   ASSESSMENT AND PLAN  HFmrEF Echo at outside hospital 06/2024 found to have LVEF 40-45% in the setting of Acute PE. The patient is euvolemic on exam. Continue Entresto  24-26mg BID, propranolol  10mg  daily and spironolactone  25mg  daily. I will add Jardiance  10mg  daily. BMET in 2 weeks. I will order echo to be done in 4-6 weeks. If EF is low, can consider ischemic testing, although prior cardiac CTA showed mild nonobstructive CAD.  Acute PE Post-op PE in September 2025. Plan for Eliquis  x 6 months.   HTN BP is good today, repeat 112/88. The patient reports BP 140s/80-90s at home, however he takes it multiple times throughout the day. I recommended he check BP twice a day and bring in readings and the cuff next visit.   LVH-apical variant Cardiac MRI in 2018 showed severe LVH of apical segment.  He was seen by EP for ICD consideration.  He saw Dr. Fernande, who did not feel ICD was indicated.  Patient denies presyncope or syncope.  HLD LDL 39 in 2024. Continue Crestor  and Zetia .     Dispo: follow-up in 2 months  Signed, Khi Mcmillen VEAR Fishman, PA-C

## 2024-09-30 NOTE — Patient Instructions (Signed)
 Medication Instructions:  Your physician recommends the following medication changes.  START TAKING: Jardiance  10 mg by mouth daily   *If you need a refill on your cardiac medications before your next appointment, please call your pharmacy*  Lab Work: Your provider would like for you to return in 2 weeks to have the following labs drawn: BMP.   Please go to [facility] [address] ([facility] [address], [place] [address] You do not need an appointment.  They are open from 8 am- 4:30 pm.  Lunch from 1:00 pm- 2:00 pm You will not need to be fasting.    Testing/Procedures: Your physician has requested that you have an  limited echocardiogram 4-6 weeks. Echocardiography is a painless test that uses sound waves to create images of your heart. It provides your doctor with information about the size and shape of your heart and how well your hearts chambers and valves are working.   You may receive an ultrasound enhancing agent through an IV if needed to better visualize your heart during the echo. This procedure takes approximately one hour.  There are no restrictions for this procedure.  This will take place at [address] [facility]) [address], [place] [address]  Please note: We ask at that you not bring children with you during ultrasound (echo/ vascular) testing. Due to room size and safety concerns, children are not allowed in the ultrasound rooms during exams. Our front office staff cannot provide observation of children in our lobby area while testing is being conducted. An adult accompanying a patient to their appointment will only be allowed in the ultrasound room at the discretion of the ultrasound technician under special circumstances. We apologize for any inconvenience.   Follow-Up: At [facility], you and your health needs are our priority.  As part of our continuing mission to provide you with exceptional heart care, our  providers are all part of one team.  This team includes your primary Cardiologist (physician) and Advanced Practice Providers or APPs (Physician Assistants and Nurse Practitioners) who all work together to provide you with the care you need, when you need it.  Your next appointment:   2 month(s)  Provider:   Mikey Fishman, PA-C

## 2024-10-02 ENCOUNTER — Ambulatory Visit: Payer: Self-pay | Admitting: Neurology

## 2024-10-02 LAB — CBC WITH DIFFERENTIAL/PLATELET
Basophils Absolute: 0.1 x10E3/uL (ref 0.0–0.2)
Basos: 1 %
EOS (ABSOLUTE): 0.2 x10E3/uL (ref 0.0–0.4)
Eos: 2 %
Hematocrit: 51.2 % — ABNORMAL HIGH (ref 37.5–51.0)
Hemoglobin: 16.5 g/dL (ref 13.0–17.7)
Immature Grans (Abs): 0 x10E3/uL (ref 0.0–0.1)
Immature Granulocytes: 0 %
Lymphocytes Absolute: 1.7 x10E3/uL (ref 0.7–3.1)
Lymphs: 23 %
MCH: 29.9 pg (ref 26.6–33.0)
MCHC: 32.2 g/dL (ref 31.5–35.7)
MCV: 93 fL (ref 79–97)
Monocytes Absolute: 0.8 x10E3/uL (ref 0.1–0.9)
Monocytes: 10 %
Neutrophils Absolute: 4.8 x10E3/uL (ref 1.4–7.0)
Neutrophils: 64 %
Platelets: 129 x10E3/uL — ABNORMAL LOW (ref 150–450)
RBC: 5.52 x10E6/uL (ref 4.14–5.80)
RDW: 12.9 % (ref 11.6–15.4)
WBC: 7.5 x10E3/uL (ref 3.4–10.8)

## 2024-10-02 LAB — COMPREHENSIVE METABOLIC PANEL WITH GFR
ALT: 11 IU/L (ref 0–44)
AST: 19 IU/L (ref 0–40)
Albumin: 3.9 g/dL (ref 3.9–4.9)
Alkaline Phosphatase: 71 IU/L (ref 47–123)
BUN/Creatinine Ratio: 14 (ref 10–24)
BUN: 22 mg/dL (ref 8–27)
Bilirubin Total: 0.8 mg/dL (ref 0.0–1.2)
CO2: 24 mmol/L (ref 20–29)
Calcium: 9.8 mg/dL (ref 8.6–10.2)
Chloride: 105 mmol/L (ref 96–106)
Creatinine, Ser: 1.58 mg/dL — ABNORMAL HIGH (ref 0.76–1.27)
Globulin, Total: 1.8 g/dL (ref 1.5–4.5)
Glucose: 86 mg/dL (ref 70–99)
Potassium: 4.1 mmol/L (ref 3.5–5.2)
Sodium: 143 mmol/L (ref 134–144)
Total Protein: 5.7 g/dL — ABNORMAL LOW (ref 6.0–8.5)
eGFR: 47 mL/min/1.73 — ABNORMAL LOW (ref >59)

## 2024-10-02 LAB — SYPHILIS: RPR W/REFLEX TO RPR TITER AND TREPONEMAL ANTIBODIES, TRADITIONAL SCREENING AND DIAGNOSIS ALGORITHM: RPR Ser Ql: NONREACTIVE

## 2024-10-02 LAB — ATN PROFILE
A -- Beta-amyloid 42/40 Ratio: 0.109 (ref >0.102)
Beta-amyloid 40: 272.37 pg/mL
Beta-amyloid 42: 29.81 pg/mL
N -- NfL, Plasma: 2.56 pg/mL (ref 0.00–6.04)
T -- p-tau181: 1.02 pg/mL — AB (ref 0.00–0.97)

## 2024-10-02 LAB — TSH: TSH: 1.82 u[IU]/mL (ref 0.450–4.500)

## 2024-10-02 LAB — VITAMIN B12: Vitamin B-12: 370 pg/mL (ref 232–1245)

## 2024-10-08 ENCOUNTER — Ambulatory Visit: Admitting: Orthopaedic Surgery

## 2024-10-08 ENCOUNTER — Other Ambulatory Visit: Payer: Self-pay

## 2024-10-08 ENCOUNTER — Encounter: Payer: Self-pay | Admitting: Orthopaedic Surgery

## 2024-10-08 DIAGNOSIS — M25552 Pain in left hip: Secondary | ICD-10-CM

## 2024-10-08 MED ORDER — METHYLPREDNISOLONE ACETATE 40 MG/ML IJ SUSP
40.0000 mg | INTRAMUSCULAR | Status: AC | PRN
  Administered 2024-10-08: 40 mg via INTRA_ARTICULAR

## 2024-10-08 MED ORDER — LIDOCAINE HCL 1 % IJ SOLN
3.0000 mL | INTRAMUSCULAR | Status: AC | PRN
  Administered 2024-10-08: 3 mL

## 2024-10-08 NOTE — Progress Notes (Signed)
 The patient is a 68 year old gentleman I am seeing for the first time.  He comes in with several year history of left hip pain but denies any groin pain.  He actually has seen another group in town for spine issues and shoulder issues.  He is here today for us  to take a look at mainly his left hip.  He is someone who is on blood thinning medications so he cannot take anti-inflammatories.  This was due to a blood clot and a PE.  He does see cardiology as well.  I believe there is a history of cardiomyopathy.  On exam his right hip and left hip move smoothly and fluidly with no pain or blocks to rotation.  There is only some pain on the lateral aspect of his left hip to palpation more consistent with tendinitis and trochanteric bursitis however there is a component of low back pain and sciatica on the left side.  X-rays today of his left hip and pelvis show normal-appearing hip joint spaces bilaterally but no significant arthritic findings at all.  There are no cortical irregularities around the left hip trochanteric area.  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  From a hip standpoint I gave him reassurance that this does not seem to be a significant hip issue.  I did recommend a steroid injection over the left hip trochanteric area which he tolerated well.  Have also recommended Salonpas lidocaine  patches for his back and hip area as well as Voltaren gel which will be safer to try being on Eliquis .  He agrees to this treatment plan.  He did tolerate the steroid injection well.  Follow-up can be as needed.    Procedure Note  Patient: Duane Warren             Date of Birth: [birth year]           MRN: [id number]             Visit Date: 10/08/2024  Procedures: Visit Diagnoses:  1. Pain in left hip     Large Joint Inj: L greater trochanter on 10/08/2024 11:26 AM Indications: pain and diagnostic  evaluation Details: 22 G 1.5 in needle, lateral approach  Arthrogram: No  Medications: 3 mL lidocaine  1 %; 40 mg methylPREDNISolone  acetate 40 MG/ML Outcome: tolerated well, no immediate complications Procedure, treatment alternatives, risks and benefits explained, specific risks discussed. Consent was given by the patient. Immediately prior to procedure a time out was called to verify the correct patient, procedure, equipment, support staff and site/side marked as required. Patient was prepped and draped in the usual sterile fashion.

## 2024-10-25 ENCOUNTER — Other Ambulatory Visit: Payer: Self-pay | Admitting: Student in an Organized Health Care Education/Training Program

## 2024-10-25 DIAGNOSIS — R053 Chronic cough: Secondary | ICD-10-CM

## 2024-10-29 ENCOUNTER — Ambulatory Visit

## 2024-11-11 ENCOUNTER — Ambulatory Visit: Payer: Self-pay | Admitting: Sleep Medicine

## 2024-11-11 ENCOUNTER — Ambulatory Visit

## 2024-11-11 DIAGNOSIS — I5022 Chronic systolic (congestive) heart failure: Secondary | ICD-10-CM | POA: Insufficient documentation

## 2024-11-11 LAB — ECHOCARDIOGRAM LIMITED
Area-P 1/2: 3.72 cm2
S' Lateral: 3.5 cm

## 2024-11-11 MED ORDER — PERFLUTREN LIPID MICROSPHERE: 1.0000 mL | INTRAVENOUS | Status: AC | PRN

## 2024-11-11 NOTE — Telephone Encounter (Signed)
 I spoke with the patient. He will reach out to [employer] about the CPAP issues.  Nothing further needed.

## 2024-11-11 NOTE — Telephone Encounter (Signed)
 FYI Only or Action Required?: Action required by provider: clinical question for provider.  Patient is followed in Pulmonology for sleep apnea/chronic cough, last seen on 09/03/2024 by Isadora Hose, MD.  Called Nurse Triage reporting Advice Only.  Symptoms began n/a.  Interventions attempted: Other: n/a.  Symptoms are: n/a.  Triage Disposition: Call PCP Now  Patient/caregiver understands and will follow disposition?: Yes  Reason for Triage: Leaking CPAP Reason for Disposition  [1] Follow-up call from patient regarding patient's clinical status AND [2] information urgent  Answer Assessment - Initial Assessment Questions Pt states that he has a leak in his cpap at the top where it connects to the head. He has a nose piece. He states that he has an appt over there today he was going to stop by the office to see if someone could help him with it. Pt denies any symptoms.     1. REASON FOR CALL or QUESTION: What is your reason for calling today? or How can I best     Needs someone to help trouble shoot his cpap for a leak.  2. CALLER: Document the source of call. (e.g., laboratory staff, caregiver or patient).     Patient.  Protocols used: PCP Call - No Triage-A-AH

## 2024-11-12 LAB — BASIC METABOLIC PANEL WITH GFR
BUN/Creatinine Ratio: 14 (ref 10–24)
BUN: 23 mg/dL (ref 8–27)
CO2: 25 mmol/L (ref 20–29)
Calcium: 9.6 mg/dL (ref 8.6–10.2)
Chloride: 106 mmol/L (ref 96–106)
Creatinine, Ser: 1.65 mg/dL — ABNORMAL HIGH (ref 0.76–1.27)
Glucose: 90 mg/dL (ref 70–99)
Potassium: 4.6 mmol/L (ref 3.5–5.2)
Sodium: 144 mmol/L (ref 134–144)
eGFR: 44 mL/min/{1.73_m2} — ABNORMAL LOW

## 2024-11-13 ENCOUNTER — Ambulatory Visit: Payer: Self-pay | Admitting: Medical

## 2024-11-13 DIAGNOSIS — N189 Chronic kidney disease, unspecified: Secondary | ICD-10-CM

## 2024-12-09 ENCOUNTER — Ambulatory Visit: Admitting: Medical

## 2024-12-10 ENCOUNTER — Ambulatory Visit: Admitting: Urology

## 2024-12-18 ENCOUNTER — Encounter

## 2024-12-18 ENCOUNTER — Ambulatory Visit: Admitting: Student in an Organized Health Care Education/Training Program

## 2024-12-22 ENCOUNTER — Ambulatory Visit: Admitting: Sleep Medicine

## 2024-12-22 ENCOUNTER — Ambulatory Visit: Admitting: Student in an Organized Health Care Education/Training Program

## 2024-12-22 ENCOUNTER — Encounter

## 2024-12-23 ENCOUNTER — Ambulatory Visit: Admitting: Neurology

## 2025-01-05 ENCOUNTER — Ambulatory Visit: Admitting: Family Medicine

## 2025-02-05 ENCOUNTER — Inpatient Hospital Stay: Admitting: Internal Medicine

## 2025-02-05 ENCOUNTER — Inpatient Hospital Stay

## 2025-03-18 ENCOUNTER — Ambulatory Visit: Admitting: Allergy
